# Patient Record
Sex: Male | Born: 1950 | Race: White | Hispanic: No | Marital: Single | Smoking: Never smoker
Health system: Southern US, Community
[De-identification: ages and names within clinical notes are randomized; demographics above are authoritative.]

## PROBLEM LIST (undated history)

## (undated) DIAGNOSIS — E785 Hyperlipidemia, unspecified: Secondary | ICD-10-CM

## (undated) DIAGNOSIS — J45909 Unspecified asthma, uncomplicated: Secondary | ICD-10-CM

## (undated) DIAGNOSIS — I10 Essential (primary) hypertension: Secondary | ICD-10-CM

## (undated) HISTORY — DX: Hyperlipidemia, unspecified: E78.5

## (undated) HISTORY — DX: Unspecified asthma, uncomplicated: J45.909

## (undated) HISTORY — DX: Essential (primary) hypertension: I10

---

## 2010-05-01 ENCOUNTER — Encounter: Payer: Self-pay | Admitting: Nurse Practitioner

## 2010-05-01 DIAGNOSIS — J45909 Unspecified asthma, uncomplicated: Secondary | ICD-10-CM | POA: Insufficient documentation

## 2010-05-01 DIAGNOSIS — E785 Hyperlipidemia, unspecified: Secondary | ICD-10-CM

## 2010-05-01 DIAGNOSIS — I10 Essential (primary) hypertension: Secondary | ICD-10-CM | POA: Insufficient documentation

## 2010-05-01 DIAGNOSIS — E1169 Type 2 diabetes mellitus with other specified complication: Secondary | ICD-10-CM | POA: Insufficient documentation

## 2010-06-16 ENCOUNTER — Encounter: Payer: Self-pay | Admitting: Nurse Practitioner

## 2010-06-23 ENCOUNTER — Encounter: Payer: Self-pay | Admitting: Nurse Practitioner

## 2012-05-01 ENCOUNTER — Other Ambulatory Visit: Payer: Self-pay | Admitting: Nurse Practitioner

## 2012-05-02 NOTE — Telephone Encounter (Signed)
Mail order, pls. print

## 2012-06-02 ENCOUNTER — Telehealth: Payer: Self-pay | Admitting: *Deleted

## 2012-06-02 NOTE — Telephone Encounter (Signed)
Give patient coupon

## 2012-06-02 NOTE — Telephone Encounter (Signed)
Pt. Is coming back by this afternoon, he does not have a phone. His copay on his Crestor went from $18 to $57, can we change to something else, he has tried Lovastatin and lipitor, was unable to take. Please let your nurse know, because he will come by this afternoon and ask for her.

## 2012-06-03 NOTE — Telephone Encounter (Signed)
crestor samples given yesterday

## 2012-07-07 ENCOUNTER — Telehealth: Payer: Self-pay | Admitting: Nurse Practitioner

## 2012-07-07 NOTE — Telephone Encounter (Signed)
Samples up front 

## 2012-07-18 ENCOUNTER — Encounter: Payer: Self-pay | Admitting: Nurse Practitioner

## 2012-07-18 ENCOUNTER — Ambulatory Visit (INDEPENDENT_AMBULATORY_CARE_PROVIDER_SITE_OTHER): Payer: Managed Care, Other (non HMO) | Admitting: Nurse Practitioner

## 2012-07-18 VITALS — BP 140/78 | HR 83 | Temp 98.2°F | Ht 68.0 in | Wt 170.0 lb

## 2012-07-18 DIAGNOSIS — I10 Essential (primary) hypertension: Secondary | ICD-10-CM

## 2012-07-18 DIAGNOSIS — E785 Hyperlipidemia, unspecified: Secondary | ICD-10-CM

## 2012-07-18 LAB — COMPLETE METABOLIC PANEL WITH GFR
CO2: 26 mEq/L (ref 19–32)
Creat: 1 mg/dL (ref 0.50–1.35)
GFR, Est African American: >89 mL/min
GFR, Est Non African American: 81 mL/min
Glucose, Bld: 122 mg/dL — ABNORMAL HIGH (ref 70–99)
Sodium: 132 mEq/L — ABNORMAL LOW (ref 135–145)
Total Bilirubin: 0.7 mg/dL (ref 0.3–1.2)
Total Protein: 7.3 g/dL (ref 6.0–8.3)

## 2012-07-18 MED ORDER — LISINOPRIL-HYDROCHLOROTHIAZIDE 20-25 MG PO TABS: 1.0000 | ORAL_TABLET | Freq: Every day | ORAL | Status: DC

## 2012-07-18 NOTE — Progress Notes (Signed)
  Subjective:    Patient ID: Charles Beltran, male    DOB: 03-04-1950, 62 y.o.   MRN: [id number]  Hypertension This is a chronic problem. The current episode started more than 1 year ago. The problem is unchanged. The problem is uncontrolled. Pertinent negatives include no chest pain, headaches, palpitations, peripheral edema or shortness of breath. There are no associated agents to hypertension. Risk factors for coronary artery disease include dyslipidemia and male gender. Past treatments include ACE inhibitors and diuretics. The current treatment provides moderate improvement. There are no compliance problems.   Hyperlipidemia This is a chronic problem. The current episode started more than 1 year ago. The problem is controlled. Recent lipid tests were reviewed and are normal. There are no known factors aggravating his hyperlipidemia. Pertinent negatives include no chest pain or shortness of breath. Current antihyperlipidemic treatment includes statins and diet change. The current treatment provides significant improvement of lipids. There are no compliance problems.  Risk factors for coronary artery disease include hypertension and male sex.      Review of Systems  Respiratory: Negative for shortness of breath.   Cardiovascular: Negative for chest pain and palpitations.  Neurological: Negative for headaches.  All other systems reviewed and are negative.       Objective:   Physical Exam  Constitutional: He is oriented to person, place, and time. He appears well-developed and well-nourished.  HENT:  Head: Normocephalic.  Right Ear: External ear normal.  Left Ear: External ear normal.  Nose: Nose normal.  Mouth/Throat: Oropharynx is clear and moist.  Eyes: EOM are normal. Pupils are equal, round, and reactive to light.  Neck: Normal range of motion. Neck supple. No thyromegaly present.  Cardiovascular: Normal rate, regular rhythm and intact distal pulses.   Murmur (2/6 systolic)  heard. Pulmonary/Chest: Effort normal and breath sounds normal. He has no wheezes. He has no rales.  Abdominal: Soft. Bowel sounds are normal.  Genitourinary:  DRE done in February 2014  Musculoskeletal: Normal range of motion.  Neurological: He is alert and oriented to person, place, and time.  Skin: Skin is warm and dry.  Psychiatric: He has a normal mood and affect. His behavior is normal. Judgment and thought content normal.   BP 140/78  Pulse 83  Temp(Src) 98.2 F (36.8 C) (Oral)  Ht 5\' 8"  (1.727 m)  Wt 170 lb (77.111 kg)  BMI 25.85 kg/m2        Assessment & Plan:  1. Hypertension Low NA+ diet  Increased lisinopril to 20/25 1 po qd #90 1 refill - COMPLETE METABOLIC PANEL WITH GFR  2. Hyperlipidemia Low fat diet an dexercise - NMR Lipoprofile with Lipids  Mary-Margaret Daphine Deutscher, FNP

## 2012-07-18 NOTE — Patient Instructions (Signed)
Health Maintenance, Males A healthy lifestyle and preventative care can promote health and wellness.  Maintain regular health, dental, and eye exams.  Eat a healthy diet. Foods like vegetables, fruits, whole grains, low-fat dairy products, and lean protein foods contain the nutrients you need without too many calories. Decrease your intake of foods high in solid fats, added sugars, and salt. Get information about a proper diet from your caregiver, if necessary.  Regular physical exercise is one of the most important things you can do for your health. Most adults should get at least 150 minutes of moderate-intensity exercise (any activity that increases your heart rate and causes you to sweat) each week. In addition, most adults need muscle-strengthening exercises on 2 or more days a week.   Maintain a healthy weight. The body mass index (BMI) is a screening tool to identify possible weight problems. It provides an estimate of body fat based on height and weight. Your caregiver can help determine your BMI, and can help you achieve or maintain a healthy weight. For adults 20 years and older:  A BMI below 18.5 is considered underweight.  A BMI of 18.5 to 24.9 is normal.  A BMI of 25 to 29.9 is considered overweight.  A BMI of 30 and above is considered obese.  Maintain normal blood lipids and cholesterol by exercising and minimizing your intake of saturated fat. Eat a balanced diet with plenty of fruits and vegetables. Blood tests for lipids and cholesterol should begin at age 20 and be repeated every 5 years. If your lipid or cholesterol levels are high, you are over 50, or you are a high risk for heart disease, you may need your cholesterol levels checked more frequently.Ongoing high lipid and cholesterol levels should be treated with medicines, if diet and exercise are not effective.  If you smoke, find out from your caregiver how to quit. If you do not use tobacco, do not start.  If you  choose to drink alcohol, do not exceed 2 drinks per day. One drink is considered to be 12 ounces (355 mL) of beer, 5 ounces (148 mL) of wine, or 1.5 ounces (44 mL) of liquor.  Avoid use of street drugs. Do not share needles with anyone. Ask for help if you need support or instructions about stopping the use of drugs.  High blood pressure causes heart disease and increases the risk of stroke. Blood pressure should be checked at least every 1 to 2 years. Ongoing high blood pressure should be treated with medicines if weight loss and exercise are not effective.  If you are 45 to 61 years old, ask your caregiver if you should take aspirin to prevent heart disease.  Diabetes screening involves taking a blood sample to check your fasting blood sugar level. This should be done once every 3 years, after age 45, if you are within normal weight and without risk factors for diabetes. Testing should be considered at a younger age or be carried out more frequently if you are overweight and have at least 1 risk factor for diabetes.  Colorectal cancer can be detected and often prevented. Most routine colorectal cancer screening begins at the age of 50 and continues through age 75. However, your caregiver may recommend screening at an earlier age if you have risk factors for colon cancer. On a yearly basis, your caregiver may provide home test kits to check for hidden blood in the stool. Use of a small camera at the end of a tube,   to directly examine the colon (sigmoidoscopy or colonoscopy), can detect the earliest forms of colorectal cancer. Talk to your caregiver about this at age 50, when routine screening begins. Direct examination of the colon should be repeated every 5 to 10 years through age 75, unless early forms of pre-cancerous polyps or small growths are found.  Hepatitis C blood testing is recommended for all people born from 1945 through 1965 and any individual with known risks for hepatitis C.  Healthy  men should no longer receive prostate-specific antigen (PSA) blood tests as part of routine cancer screening. Consult with your caregiver about prostate cancer screening.  Testicular cancer screening is not recommended for adolescents or adult males who have no symptoms. Screening includes self-exam, caregiver exam, and other screening tests. Consult with your caregiver about any symptoms you have or any concerns you have about testicular cancer.  Practice safe sex. Use condoms and avoid high-risk sexual practices to reduce the spread of sexually transmitted infections (STIs).  Use sunscreen with a sun protection factor (SPF) of 30 or greater. Apply sunscreen liberally and repeatedly throughout the day. You should seek shade when your shadow is shorter than you. Protect yourself by wearing long sleeves, pants, a wide-brimmed hat, and sunglasses year round, whenever you are outdoors.  Notify your caregiver of new moles or changes in moles, especially if there is a change in shape or color. Also notify your caregiver if a mole is larger than the size of a pencil eraser.  A one-time screening for abdominal aortic aneurysm (AAA) and surgical repair of large AAAs by sound wave imaging (ultrasonography) is recommended for ages 65 to 75 years who are current or former smokers.  Stay current with your immunizations. Document Released: 07/21/2007 Document Revised: 04/16/2011 Document Reviewed: 06/19/2010 [employer] Patient Information 2014 [employer].  

## 2012-07-21 LAB — NMR LIPOPROFILE WITH LIPIDS
HDL Size: 9.3 nm (ref >=9.2)
HDL-C: 52 mg/dL (ref >=40)
LDL Size: 20.4 nm — ABNORMAL LOW (ref >20.5)
Large HDL-P: 5.2 umol/L (ref >=4.8)

## 2012-07-24 ENCOUNTER — Encounter: Payer: Self-pay | Admitting: *Deleted

## 2012-07-29 ENCOUNTER — Telehealth: Payer: Self-pay | Admitting: Nurse Practitioner

## 2012-08-04 ENCOUNTER — Telehealth: Payer: Self-pay | Admitting: Nurse Practitioner

## 2012-08-04 NOTE — Telephone Encounter (Signed)
MMM sent this directly to [facility] 07/18/12

## 2012-08-04 NOTE — Telephone Encounter (Signed)
done

## 2012-08-06 ENCOUNTER — Telehealth: Payer: Self-pay | Admitting: Nurse Practitioner

## 2012-08-12 NOTE — Telephone Encounter (Signed)
Left message to call back. x3

## 2012-09-03 ENCOUNTER — Telehealth: Payer: Self-pay | Admitting: Nurse Practitioner

## 2012-09-04 NOTE — Telephone Encounter (Signed)
Samples Crestor 10mg  given 09/03/12

## 2012-10-31 ENCOUNTER — Ambulatory Visit (INDEPENDENT_AMBULATORY_CARE_PROVIDER_SITE_OTHER): Payer: Managed Care, Other (non HMO) | Admitting: Nurse Practitioner

## 2012-10-31 ENCOUNTER — Encounter: Payer: Self-pay | Admitting: Nurse Practitioner

## 2012-10-31 VITALS — BP 139/72 | HR 78 | Temp 97.9°F | Ht 68.0 in | Wt 179.0 lb

## 2012-10-31 DIAGNOSIS — Z23 Encounter for immunization: Secondary | ICD-10-CM

## 2012-10-31 DIAGNOSIS — Z125 Encounter for screening for malignant neoplasm of prostate: Secondary | ICD-10-CM

## 2012-10-31 DIAGNOSIS — I10 Essential (primary) hypertension: Secondary | ICD-10-CM

## 2012-10-31 DIAGNOSIS — E785 Hyperlipidemia, unspecified: Secondary | ICD-10-CM

## 2012-10-31 MED ORDER — LISINOPRIL-HYDROCHLOROTHIAZIDE 20-25 MG PO TABS: 1.0000 | ORAL_TABLET | Freq: Every day | ORAL | Status: DC

## 2012-10-31 NOTE — Patient Instructions (Addendum)
Health Maintenance, Males A healthy lifestyle and preventative care can promote health and wellness.  Maintain regular health, dental, and eye exams.  Eat a healthy diet. Foods like vegetables, fruits, whole grains, low-fat dairy products, and lean protein foods contain the nutrients you need without too many calories. Decrease your intake of foods high in solid fats, added sugars, and salt. Get information about a proper diet from your caregiver, if necessary.  Regular physical exercise is one of the most important things you can do for your health. Most adults should get at least 150 minutes of moderate-intensity exercise (any activity that increases your heart rate and causes you to sweat) each week. In addition, most adults need muscle-strengthening exercises on 2 or more days a week.   Maintain a healthy weight. The body mass index (BMI) is a screening tool to identify possible weight problems. It provides an estimate of body fat based on height and weight. Your caregiver can help determine your BMI, and can help you achieve or maintain a healthy weight. For adults 20 years and older:  A BMI below 18.5 is considered underweight.  A BMI of 18.5 to 24.9 is normal.  A BMI of 25 to 29.9 is considered overweight.  A BMI of 30 and above is considered obese.  Maintain normal blood lipids and cholesterol by exercising and minimizing your intake of saturated fat. Eat a balanced diet with plenty of fruits and vegetables. Blood tests for lipids and cholesterol should begin at age 20 and be repeated every 5 years. If your lipid or cholesterol levels are high, you are over 50, or you are a high risk for heart disease, you may need your cholesterol levels checked more frequently.Ongoing high lipid and cholesterol levels should be treated with medicines, if diet and exercise are not effective.  If you smoke, find out from your caregiver how to quit. If you do not use tobacco, do not start.  If you  choose to drink alcohol, do not exceed 2 drinks per day. One drink is considered to be 12 ounces (355 mL) of beer, 5 ounces (148 mL) of wine, or 1.5 ounces (44 mL) of liquor.  Avoid use of street drugs. Do not share needles with anyone. Ask for help if you need support or instructions about stopping the use of drugs.  High blood pressure causes heart disease and increases the risk of stroke. Blood pressure should be checked at least every 1 to 2 years. Ongoing high blood pressure should be treated with medicines if weight loss and exercise are not effective.  If you are 45 to 61 years old, ask your caregiver if you should take aspirin to prevent heart disease.  Diabetes screening involves taking a blood sample to check your fasting blood sugar level. This should be done once every 3 years, after age 45, if you are within normal weight and without risk factors for diabetes. Testing should be considered at a younger age or be carried out more frequently if you are overweight and have at least 1 risk factor for diabetes.  Colorectal cancer can be detected and often prevented. Most routine colorectal cancer screening begins at the age of 50 and continues through age 75. However, your caregiver may recommend screening at an earlier age if you have risk factors for colon cancer. On a yearly basis, your caregiver may provide home test kits to check for hidden blood in the stool. Use of a small camera at the end of a tube,   to directly examine the colon (sigmoidoscopy or colonoscopy), can detect the earliest forms of colorectal cancer. Talk to your caregiver about this at age 33, when routine screening begins. Direct examination of the colon should be repeated every 5 to 10 years through age 26, unless early forms of pre-cancerous polyps or small growths are found.  Hepatitis C blood testing is recommended for all people born from [birth year] through 1965 and any individual with known risks for hepatitis C.  Healthy  men should no longer receive prostate-specific antigen (PSA) blood tests as part of routine cancer screening. Consult with your caregiver about prostate cancer screening.  Testicular cancer screening is not recommended for adolescents or adult males who have no symptoms. Screening includes self-exam, caregiver exam, and other screening tests. Consult with your caregiver about any symptoms you have or any concerns you have about testicular cancer.  Practice safe sex. Use condoms and avoid high-risk sexual practices to reduce the spread of sexually transmitted infections (STIs).  Use sunscreen with a sun protection factor (SPF) of 30 or greater. Apply sunscreen liberally and repeatedly throughout the day. You should seek shade when your shadow is shorter than you. Protect yourself by wearing long sleeves, pants, a wide-brimmed hat, and sunglasses year round, whenever you are outdoors.  Notify your caregiver of new moles or changes in moles, especially if there is a change in shape or color. Also notify your caregiver if a mole is larger than the size of a pencil eraser.  A one-time screening for abdominal aortic aneurysm (AAA) and surgical repair of large AAAs by sound wave imaging (ultrasonography) is recommended for ages 59 to 35 years who are current or former smokers.  Stay current with your immunizations. Document Released: 07/21/2007 Document Revised: 04/16/2011 Document Reviewed: 06/19/2010 [facility] Patient Information 2014 [place], [place]. Influenza Virus Vaccine injection (Fluarix) What is this medicine? INFLUENZA VIRUS VACCINE (in floo EN zuh VAHY ruhs vak SEEN) helps to reduce the risk of getting influenza also known as the flu. This medicine may be used for other purposes; ask your health care provider or pharmacist if you have questions. What should I tell my health care provider before I take this medicine? They need to know if you have any of these conditions: -bleeding disorder  like hemophilia -fever or infection -Guillain-Barre syndrome or other neurological problems -immune system problems -infection with the human immunodeficiency virus (HIV) or AIDS -low blood platelet counts -multiple sclerosis -an unusual or allergic reaction to influenza virus vaccine, eggs, chicken proteins, latex, gentamicin, other medicines, foods, dyes or preservatives -pregnant or trying to get pregnant -breast-feeding How should I use this medicine? This vaccine is for injection into a muscle. It is given by a health care professional. A copy of Vaccine Information Statements will be given before each vaccination. Read this sheet carefully each time. The sheet may change frequently. Talk to your pediatrician regarding the use of this medicine in children. Special care may be needed. Overdosage: If you think you have taken too much of this medicine contact a poison control center or emergency room at once. NOTE: This medicine is only for you. Do not share this medicine with others. What if I miss a dose? This does not apply. What may interact with this medicine? -chemotherapy or radiation therapy -medicines that lower your immune system like etanercept, anakinra, infliximab, and adalimumab -medicines that treat or prevent blood clots like warfarin -phenytoin -steroid medicines like prednisone or cortisone -theophylline -vaccines This list may not describe all  possible interactions. Give your health care provider a list of all the medicines, herbs, non-prescription drugs, or dietary supplements you use. Also tell them if you smoke, drink alcohol, or use illegal drugs. Some items may interact with your medicine. What should I watch for while using this medicine? Report any side effects that do not go away within 3 days to your doctor or health care professional. Call your health care provider if any unusual symptoms occur within 6 weeks of receiving this vaccine. You may still catch  the flu, but the illness is not usually as bad. You cannot get the flu from the vaccine. The vaccine will not protect against colds or other illnesses that may cause fever. The vaccine is needed every year. What side effects may I notice from receiving this medicine? Side effects that you should report to your doctor or health care professional as soon as possible: -allergic reactions like skin rash, itching or hives, swelling of the face, lips, or tongue Side effects that usually do not require medical attention (report to your doctor or health care professional if they continue or are bothersome): -fever -headache -muscle aches and pains -pain, tenderness, redness, or swelling at site where injected -weak or tired This list may not describe all possible side effects. Call your doctor for medical advice about side effects. You may report side effects to FDA at 1-800-FDA-1088. Where should I keep my medicine? This vaccine is only given in a clinic, pharmacy, doctor's office, or other health care setting and will not be stored at home. NOTE: This sheet is a summary. It may not cover all possible information. If you have questions about this medicine, talk to your doctor, pharmacist, or health care provider.  2012, [employer]. (08/20/2007 9:30:40 AM)

## 2012-10-31 NOTE — Progress Notes (Signed)
  Subjective:    Patient ID: Charles Beltran, male    DOB: Jan 27, 1951, 62 y.o.   MRN: [id number]  Hypertension This is a chronic problem. The current episode started more than 1 year ago. The problem is unchanged. The problem is uncontrolled. Pertinent negatives include no chest pain, headaches, palpitations, peripheral edema or shortness of breath. There are no associated agents to hypertension. Risk factors for coronary artery disease include dyslipidemia and male gender. Past treatments include ACE inhibitors and diuretics. The current treatment provides moderate improvement. There are no compliance problems.   Hyperlipidemia This is a chronic problem. The current episode started more than 1 year ago. The problem is controlled. Recent lipid tests were reviewed and are normal. There are no known factors aggravating his hyperlipidemia. Pertinent negatives include no chest pain or shortness of breath. Current antihyperlipidemic treatment includes statins and diet change. The current treatment provides significant improvement of lipids. There are no compliance problems.  Risk factors for coronary artery disease include hypertension and male sex.      Review of Systems  Respiratory: Negative for shortness of breath.   Cardiovascular: Negative for chest pain and palpitations.  Neurological: Negative for headaches.  All other systems reviewed and are negative.       Objective:   Physical Exam  Constitutional: He is oriented to person, place, and time. He appears well-developed and well-nourished.  HENT:  Head: Normocephalic.  Right Ear: External ear normal.  Left Ear: External ear normal.  Nose: Nose normal.  Mouth/Throat: Oropharynx is clear and moist.  Eyes: EOM are normal. Pupils are equal, round, and reactive to light.  Neck: Normal range of motion. Neck supple. No thyromegaly present.  Cardiovascular: Normal rate, regular rhythm and intact distal pulses.   Murmur (2/6 systolic)  heard. Pulmonary/Chest: Effort normal and breath sounds normal. He has no wheezes. He has no rales.  Abdominal: Soft. Bowel sounds are normal.  Genitourinary:  DRE done in February 2014  Musculoskeletal: Normal range of motion.  Neurological: He is alert and oriented to person, place, and time.  Skin: Skin is warm and dry.  Psychiatric: He has a normal mood and affect. His behavior is normal. Judgment and thought content normal.   BP 139/72  Pulse 78  Temp(Src) 97.9 F (36.6 C) (Oral)  Ht 5\' 8"  (1.727 m)  Wt 179 lb (81.194 kg)  BMI 27.22 kg/m2        Assessment & Plan:  1. Hypertension Low NA+ diet  Increased lisinopril to 20/25 1 po qd #90 1 refill - COMPLETE METABOLIC PANEL WITH GFR  2. Hyperlipidemia Low fat diet an dexercise - NMR Lipoprofile with Lipids  Flu shot given today Health maintenance reviewed PSA level drawn today  Mary-Margaret Daphine Deutscher, FNP

## 2012-11-02 LAB — CMP14+EGFR
ALT: 34 IU/L (ref 0–44)
AST: 28 IU/L (ref 0–40)
Alkaline Phosphatase: 52 IU/L (ref 39–117)
BUN/Creatinine Ratio: 14 (ref 10–22)
CO2: 26 mmol/L (ref 18–29)
Calcium: 9.5 mg/dL (ref 8.6–10.2)
Chloride: 96 mmol/L — ABNORMAL LOW (ref 97–108)
Creatinine, Ser: 0.95 mg/dL (ref 0.76–1.27)
Globulin, Total: 2.5 g/dL (ref 1.5–4.5)
Glucose: 115 mg/dL — ABNORMAL HIGH (ref 65–99)
Potassium: 4.2 mmol/L (ref 3.5–5.2)
Sodium: 136 mmol/L (ref 134–144)

## 2012-11-02 LAB — NMR, LIPOPROFILE
Cholesterol: 148 mg/dL (ref <200)
HDL Cholesterol by NMR: 53 mg/dL (ref >=40)
HDL Particle Number: 40.1 umol/L (ref >=30.5)
LDLC SERPL CALC-MCNC: 76 mg/dL (ref <100)
LP-IR Score: 26 (ref <=45)

## 2012-11-02 LAB — PSA, TOTAL AND FREE
PSA, Free: 0.28 ng/mL
PSA: 0.9 ng/mL (ref 0.0–4.0)

## 2012-11-04 ENCOUNTER — Ambulatory Visit: Payer: Managed Care, Other (non HMO)

## 2012-11-28 ENCOUNTER — Telehealth: Payer: Self-pay | Admitting: Nurse Practitioner

## 2012-11-28 NOTE — Telephone Encounter (Signed)
Don't have any- call next week

## 2012-12-02 NOTE — Telephone Encounter (Signed)
Up front 

## 2012-12-30 ENCOUNTER — Telehealth: Payer: Self-pay | Admitting: Nurse Practitioner

## 2012-12-31 NOTE — Telephone Encounter (Signed)
Patient aware.

## 2013-01-16 ENCOUNTER — Telehealth: Payer: Self-pay | Admitting: Nurse Practitioner

## 2013-02-10 ENCOUNTER — Ambulatory Visit (INDEPENDENT_AMBULATORY_CARE_PROVIDER_SITE_OTHER): Payer: Managed Care, Other (non HMO) | Admitting: Nurse Practitioner

## 2013-02-10 ENCOUNTER — Encounter: Payer: Self-pay | Admitting: Nurse Practitioner

## 2013-02-10 VITALS — BP 144/78 | HR 67 | Temp 98.0°F | Ht 68.0 in | Wt 174.0 lb

## 2013-02-10 DIAGNOSIS — E785 Hyperlipidemia, unspecified: Secondary | ICD-10-CM

## 2013-02-10 DIAGNOSIS — Z23 Encounter for immunization: Secondary | ICD-10-CM

## 2013-02-10 DIAGNOSIS — I10 Essential (primary) hypertension: Secondary | ICD-10-CM

## 2013-02-10 MED ORDER — LISINOPRIL-HYDROCHLOROTHIAZIDE 20-25 MG PO TABS: 1.0000 | ORAL_TABLET | Freq: Every day | ORAL | Status: DC

## 2013-02-10 MED ORDER — ROSUVASTATIN CALCIUM 10 MG PO TABS: 10.0000 mg | ORAL_TABLET | Freq: Every day | ORAL | Status: DC

## 2013-02-10 NOTE — Patient Instructions (Signed)

## 2013-02-10 NOTE — Progress Notes (Signed)
  Subjective:    Patient ID: Charles Beltran, male    DOB: Oct 15, 1950, 63 y.o.   MRN: [id number]  Patient in today for follow up of chronic medical problems- no changes since last visit. NO complaints today.   Hypertension This is a chronic problem. The current episode started more than 1 year ago. The problem is unchanged. The problem is uncontrolled. Pertinent negatives include no chest pain, headaches, palpitations, peripheral edema or shortness of breath. There are no associated agents to hypertension. Risk factors for coronary artery disease include dyslipidemia and male gender. Past treatments include ACE inhibitors and diuretics. The current treatment provides moderate improvement. There are no compliance problems.   Hyperlipidemia This is a chronic problem. The current episode started more than 1 year ago. The problem is controlled. Recent lipid tests were reviewed and are normal. There are no known factors aggravating his hyperlipidemia. Pertinent negatives include no chest pain or shortness of breath. Current antihyperlipidemic treatment includes statins and diet change. The current treatment provides significant improvement of lipids. There are no compliance problems.  Risk factors for coronary artery disease include hypertension and male sex.      Review of Systems  Respiratory: Negative for shortness of breath.   Cardiovascular: Negative for chest pain and palpitations.  Neurological: Negative for headaches.  All other systems reviewed and are negative.       Objective:   Physical Exam  Constitutional: He is oriented to person, place, and time. He appears well-developed and well-nourished.  HENT:  Head: Normocephalic.  Right Ear: External ear normal.  Left Ear: External ear normal.  Nose: Nose normal.  Mouth/Throat: Oropharynx is clear and moist.  Eyes: EOM are normal. Pupils are equal, round, and reactive to light.  Neck: Normal range of motion. Neck supple. No thyromegaly  present.  Cardiovascular: Normal rate, regular rhythm and intact distal pulses.   Murmur (2/6 systolic) heard. Pulmonary/Chest: Effort normal and breath sounds normal. He has no wheezes. He has no rales.  Abdominal: Soft. Bowel sounds are normal.  Genitourinary:  DRE done in February 2014  Musculoskeletal: Normal range of motion.  Neurological: He is alert and oriented to person, place, and time.  Skin: Skin is warm and dry.  Psychiatric: He has a normal mood and affect. His behavior is normal. Judgment and thought content normal.   BP 144/78  Pulse 67  Temp(Src) 98 F (36.7 C) (Oral)  Ht $R'5\' 8"'zS$  (1.727 m)  Wt 174 lb (78.926 kg)  BMI 26.46 kg/m2        Assessment & Plan:   1. Hyperlipidemia   2. Hypertension    Orders Placed This Encounter  Procedures  . CMP14+EGFR  . NMR, lipoprofile   Meds ordered this encounter  Medications  . lisinopril-hydrochlorothiazide (PRINZIDE,ZESTORETIC) 20-25 MG per tablet    Sig: Take 1 tablet by mouth daily.    Dispense:  90 tablet    Refill:  1    Order Specific Question:  Supervising Provider    Answer:  Chipper Herb [[id number]]  . rosuvastatin (CRESTOR) 10 MG tablet    Sig: Take 1 tablet (10 mg total) by mouth daily.    Dispense:  30 tablet    Refill:  0    Order Specific Question:  Supervising Provider    Answer:  Joycelyn Man    Continue all meds Labs pending Diet and exercise encouraged Health maintenance reviewed Follow up in 3 months  [place], FNP

## 2013-02-11 LAB — NMR, LIPOPROFILE
Cholesterol: 151 mg/dL (ref <200)
HDL CHOLESTEROL BY NMR: 55 mg/dL (ref >=40)
HDL PARTICLE NUMBER: 33.8 umol/L (ref >=30.5)
LDL Particle Number: 1064 nmol/L — ABNORMAL HIGH (ref <1000)
LDL Size: 20 nm — ABNORMAL LOW (ref >20.5)
LDLC SERPL CALC-MCNC: 81 mg/dL (ref <100)
LP-IR Score: 37 (ref <=45)
SMALL LDL PARTICLE NUMBER: 955 nmol/L — AB (ref <=527)
TRIGLYCERIDES BY NMR: 74 mg/dL (ref <150)

## 2013-02-11 LAB — CMP14+EGFR
A/G RATIO: 2 (ref 1.1–2.5)
ALT: 32 IU/L (ref 0–44)
AST: 24 IU/L (ref 0–40)
Albumin: 4.9 g/dL — ABNORMAL HIGH (ref 3.6–4.8)
Alkaline Phosphatase: 47 IU/L (ref 39–117)
BUN/Creatinine Ratio: 20 (ref 10–22)
BUN: 20 mg/dL (ref 8–27)
CALCIUM: 9.8 mg/dL (ref 8.6–10.2)
CO2: 23 mmol/L (ref 18–29)
CREATININE: 0.99 mg/dL (ref 0.76–1.27)
Chloride: 95 mmol/L — ABNORMAL LOW (ref 97–108)
GFR calc Af Amer: 94 mL/min/{1.73_m2} (ref >59)
GFR, EST NON AFRICAN AMERICAN: 81 mL/min/{1.73_m2} (ref >59)
Globulin, Total: 2.5 g/dL (ref 1.5–4.5)
Glucose: 122 mg/dL — ABNORMAL HIGH (ref 65–99)
Potassium: 4.7 mmol/L (ref 3.5–5.2)
SODIUM: 136 mmol/L (ref 134–144)
TOTAL PROTEIN: 7.4 g/dL (ref 6.0–8.5)
Total Bilirubin: 0.7 mg/dL (ref 0.0–1.2)

## 2013-02-18 ENCOUNTER — Telehealth: Payer: Self-pay | Admitting: *Deleted

## 2013-02-18 NOTE — Telephone Encounter (Signed)
PT NOTIFIED ABOUT LABS.

## 2013-02-20 ENCOUNTER — Ambulatory Visit (HOSPITAL_COMMUNITY)
Admission: RE | Admit: 2013-02-20 | Discharge: 2013-02-20 | Disposition: A | Discharge status: Home / Self Care | Payer: Managed Care, Other (non HMO) | Source: Ambulatory Visit | Attending: General Practice | Admitting: General Practice

## 2013-02-20 ENCOUNTER — Telehealth: Payer: Self-pay | Admitting: General Practice

## 2013-02-20 ENCOUNTER — Ambulatory Visit (INDEPENDENT_AMBULATORY_CARE_PROVIDER_SITE_OTHER): Payer: Managed Care, Other (non HMO) | Admitting: General Practice

## 2013-02-20 VITALS — BP 144/78 | HR 97 | Temp 97.9°F | Ht 68.0 in | Wt 176.0 lb

## 2013-02-20 DIAGNOSIS — R6884 Jaw pain: Secondary | ICD-10-CM

## 2013-02-20 DIAGNOSIS — R221 Localized swelling, mass and lump, neck: Principal | ICD-10-CM

## 2013-02-20 DIAGNOSIS — R220 Localized swelling, mass and lump, head: Secondary | ICD-10-CM | POA: Insufficient documentation

## 2013-02-20 DIAGNOSIS — M26609 Unspecified temporomandibular joint disorder, unspecified side: Secondary | ICD-10-CM

## 2013-02-20 NOTE — Progress Notes (Signed)
Subjective:    Patient ID: Gayleen OremJames Ausley, male    DOB: 11/11/1950, 63 y.o.   MRN: [id number]  HPI Patient presents today with c/o left facial/jaw area swelling and pain. He reports onset of pain was 3-4 days ago and was seen by dentist yesterday. He was prescribed hydrocodone and azithromycin. Patient reports onset of swelling as yesterday after visiting dentist.  Pain worse with chewing on left side of mouth.                                                                                                                                                                                                                                                                                                                                                          Review of Systems  Constitutional: Negative for fever and chills.  HENT:       Left jaw tenderness and swelling  Respiratory: Negative for chest tightness and shortness of breath.   All other systems reviewed and are negative.       Objective:   Physical Exam  Constitutional: He is oriented to person, place, and time. He appears well-developed and well-nourished.  HENT:  Right Ear: External ear normal.  Left Ear: External ear normal.  Left mandible edema, negative for erythema or warmth  Cardiovascular: Normal rate, regular rhythm and normal heart sounds.   Pulmonary/Chest: Effort normal and breath sounds normal. No respiratory distress. He exhibits no tenderness.  Neurological: He is alert and oriented to person, place, and time.          Assessment & Plan:  1. Jaw swelling and 2. Pain in lower jaw - US Soft Tissue Head/Neck; Future -Continue taking medications as prescribed -Follow up in 2 days and sooner if symptoms worsen -may seek emergency medical treatment -Patient verbalized understanding  Dinesh Ulysse E. Tyliyah Mcmeekin, FNP-C

## 2013-02-20 NOTE — Telephone Encounter (Signed)
Provider spoke with him about results earlier today.

## 2013-03-20 ENCOUNTER — Telehealth: Payer: Self-pay | Admitting: Nurse Practitioner

## 2013-03-20 NOTE — Telephone Encounter (Signed)
No samples 

## 2013-04-27 ENCOUNTER — Telehealth: Payer: Self-pay | Admitting: Nurse Practitioner

## 2013-04-27 NOTE — Telephone Encounter (Signed)
Samples up front 

## 2013-05-12 ENCOUNTER — Encounter: Payer: Self-pay | Admitting: Nurse Practitioner

## 2013-05-12 ENCOUNTER — Ambulatory Visit (INDEPENDENT_AMBULATORY_CARE_PROVIDER_SITE_OTHER): Payer: Managed Care, Other (non HMO)

## 2013-05-12 ENCOUNTER — Ambulatory Visit (INDEPENDENT_AMBULATORY_CARE_PROVIDER_SITE_OTHER): Payer: Managed Care, Other (non HMO) | Admitting: Nurse Practitioner

## 2013-05-12 VITALS — BP 142/73 | HR 65 | Temp 96.8°F | Ht 68.0 in | Wt 173.0 lb

## 2013-05-12 DIAGNOSIS — Z0000 Encounter for general adult medical examination without abnormal findings: Secondary | ICD-10-CM

## 2013-05-12 DIAGNOSIS — E785 Hyperlipidemia, unspecified: Secondary | ICD-10-CM

## 2013-05-12 DIAGNOSIS — I10 Essential (primary) hypertension: Secondary | ICD-10-CM

## 2013-05-12 MED ORDER — LISINOPRIL-HYDROCHLOROTHIAZIDE 20-25 MG PO TABS: 1.0000 | ORAL_TABLET | Freq: Every day | ORAL | Status: DC

## 2013-05-12 NOTE — Progress Notes (Signed)
Subjective:    Patient ID: Arther Heisler, male    DOB: 10/18/1950, 63 y.o.   MRN: [id number]  HPI  Patient presents today for annual exam. Was recently seen in January for chronic medical problems and had lab work done. States he is doing well and has no new complaints. Recent dental exam in February. Eye exam to be scheduled. Patient states he is attempting to decrease salt intake as well as fried foods. Does not exercise.  Review of Systems  Respiratory: Negative for shortness of breath.   Cardiovascular: Negative for chest pain and palpitations.  Gastrointestinal: Negative for diarrhea and constipation.  Neurological: Negative for dizziness and headaches.  Psychiatric/Behavioral: Negative for sleep disturbance.  All other systems reviewed and are negative.       Objective:   Physical Exam  Constitutional: He is oriented to person, place, and time. He appears well-developed and well-nourished.  HENT:  Head: Normocephalic.  Right Ear: External ear normal.  Left Ear: External ear normal.  Nose: Nose normal.  Mouth/Throat: Oropharynx is clear and moist.  Cerumen impaction bil  Eyes: Pupils are equal, round, and reactive to light.  Neck: Normal range of motion. Neck supple. No JVD present. No thyromegaly present.  Cardiovascular: Normal rate, regular rhythm and intact distal pulses.  Exam reveals no gallop and no friction rub.   Murmur heard. 2/6 systolic   Pulmonary/Chest: Effort normal and breath sounds normal. No respiratory distress. He has no wheezes. He has no rales. He exhibits no tenderness.  Abdominal: Soft. Bowel sounds are normal. He exhibits no mass. There is no tenderness.  Genitourinary: Rectum normal, prostate normal and penis normal.  Musculoskeletal: Normal range of motion. He exhibits no edema.  Lymphadenopathy:    He has no cervical adenopathy.  Neurological: He is alert and oriented to person, place, and time. No cranial nerve deficit.  Skin: Skin is warm  and dry.  Psychiatric: He has a normal mood and affect. His behavior is normal. Judgment and thought content normal.   BP 142/73  Pulse 65  Temp(Src) 96.8 F (36 C) (Oral)  Ht '5\' 8"'  (1.727 m)  Wt 173 lb (78.472 kg)  BMI 26.31 kg/m2  EKG: NSR Preliminary reading by Ronnald Collum, FNP  [facility]  Chest X-Ray: WNL Preliminary reading by Ronnald Collum, FNP  [facility]       Assessment & Plan:   1. Annual physical exam   2. Hypertension   3. Hyperlipidemia    Orders Placed This Encounter  Procedures  . DG Chest 2 View    Standing Status: Future     Number of Occurrences: 1     Standing Expiration Date: 07/12/2014    Order Specific Question:  Reason for Exam (SYMPTOM  OR DIAGNOSIS REQUIRED)    Answer:  Annual Exam    Order Specific Question:  Preferred imaging location?    Answer:  Internal  . CMP14+EGFR  . NMR, lipoprofile  . EKG 12-Lead   Meds ordered this encounter  Medications  . lisinopril-hydrochlorothiazide (PRINZIDE,ZESTORETIC) 20-25 MG per tablet    Sig: Take 1 tablet by mouth daily.    Dispense:  90 tablet    Refill:  1    Order Specific Question:  Supervising Provider    Answer:  Chipper Herb [[id number]]  hemoccult cards given to patient with directionsl abs pending Health maintenance reviewed - hemocult cards given Diet and exercise encouraged Continue all meds Follow up  In 3 months for chronic medical problems &  1 yr for annual exam   Mary-Margaret Hassell Done, FNP

## 2013-05-12 NOTE — Patient Instructions (Signed)

## 2013-05-14 LAB — CMP14+EGFR
A/G RATIO: 2 (ref 1.1–2.5)
ALK PHOS: 50 IU/L (ref 39–117)
ALT: 31 IU/L (ref 0–44)
AST: 25 IU/L (ref 0–40)
Albumin: 4.7 g/dL (ref 3.6–4.8)
BILIRUBIN TOTAL: 0.7 mg/dL (ref 0.0–1.2)
BUN / CREAT RATIO: 16 (ref 10–22)
BUN: 17 mg/dL (ref 8–27)
CO2: 26 mmol/L (ref 18–29)
Calcium: 9.7 mg/dL (ref 8.6–10.2)
Chloride: 95 mmol/L — ABNORMAL LOW (ref 97–108)
Creatinine, Ser: 1.07 mg/dL (ref 0.76–1.27)
GFR, EST AFRICAN AMERICAN: 86 mL/min/{1.73_m2} (ref >59)
GFR, EST NON AFRICAN AMERICAN: 74 mL/min/{1.73_m2} (ref >59)
Globulin, Total: 2.3 g/dL (ref 1.5–4.5)
Glucose: 125 mg/dL — ABNORMAL HIGH (ref 65–99)
POTASSIUM: 4.5 mmol/L (ref 3.5–5.2)
SODIUM: 137 mmol/L (ref 134–144)
TOTAL PROTEIN: 7 g/dL (ref 6.0–8.5)

## 2013-05-14 LAB — NMR, LIPOPROFILE
Cholesterol: 151 mg/dL (ref <200)
HDL Cholesterol by NMR: 54 mg/dL (ref >=40)
HDL Particle Number: 33.4 umol/L (ref >=30.5)
LDL Particle Number: 891 nmol/L (ref <1000)
LDL SIZE: 20.6 nm (ref >20.5)
LDLC SERPL CALC-MCNC: 73 mg/dL (ref <100)
LP-IR SCORE: 27 (ref <=45)
Small LDL Particle Number: 337 nmol/L (ref <=527)
Triglycerides by NMR: 121 mg/dL (ref <150)

## 2013-07-07 ENCOUNTER — Ambulatory Visit: Payer: Managed Care, Other (non HMO) | Admitting: *Deleted

## 2013-07-07 ENCOUNTER — Telehealth: Payer: Self-pay | Admitting: Nurse Practitioner

## 2013-07-07 MED ORDER — ROSUVASTATIN CALCIUM 20 MG PO TABS: 10.0000 mg | ORAL_TABLET | Freq: Every day | ORAL | Status: DC

## 2013-07-07 NOTE — Telephone Encounter (Signed)
Patient came by this afternoon for samples

## 2013-07-07 NOTE — Progress Notes (Signed)
Patient aware and will pick up at front desk.

## 2013-08-14 ENCOUNTER — Ambulatory Visit (INDEPENDENT_AMBULATORY_CARE_PROVIDER_SITE_OTHER): Payer: Managed Care, Other (non HMO) | Admitting: Nurse Practitioner

## 2013-08-14 ENCOUNTER — Encounter: Payer: Self-pay | Admitting: Nurse Practitioner

## 2013-08-14 VITALS — BP 140/70 | HR 86 | Temp 97.7°F | Ht 68.0 in | Wt 176.0 lb

## 2013-08-14 DIAGNOSIS — E785 Hyperlipidemia, unspecified: Secondary | ICD-10-CM

## 2013-08-14 DIAGNOSIS — J45909 Unspecified asthma, uncomplicated: Secondary | ICD-10-CM

## 2013-08-14 DIAGNOSIS — I10 Essential (primary) hypertension: Secondary | ICD-10-CM

## 2013-08-14 MED ORDER — LISINOPRIL-HYDROCHLOROTHIAZIDE 20-25 MG PO TABS: 1.0000 | ORAL_TABLET | Freq: Every day | ORAL | Status: DC

## 2013-08-14 NOTE — Progress Notes (Signed)
  Subjective:    Patient ID: Charles Beltran, male    DOB: Apr 21, 1950, 63 y.o.   MRN: [id number]  Patient in today for follow up of chronic medical problems- no changes since last visit. NO complaints today.   Hypertension This is a chronic problem. The current episode started more than 1 year ago. The problem is unchanged. The problem is uncontrolled. Pertinent negatives include no chest pain, headaches, palpitations, peripheral edema or shortness of breath. There are no associated agents to hypertension. Risk factors for coronary artery disease include dyslipidemia and male gender. Past treatments include ACE inhibitors and diuretics. The current treatment provides moderate improvement. There are no compliance problems.   Hyperlipidemia This is a chronic problem. The current episode started more than 1 year ago. The problem is controlled. Recent lipid tests were reviewed and are normal. There are no known factors aggravating his hyperlipidemia. Pertinent negatives include no chest pain or shortness of breath. Current antihyperlipidemic treatment includes statins and diet change. The current treatment provides significant improvement of lipids. There are no compliance problems.  Risk factors for coronary artery disease include hypertension and male sex.      Review of Systems  Respiratory: Negative for shortness of breath.   Cardiovascular: Negative for chest pain and palpitations.  Neurological: Negative for headaches.  All other systems reviewed and are negative.      Objective:   Physical Exam  Constitutional: He is oriented to person, place, and time. He appears well-developed and well-nourished.  HENT:  Head: Normocephalic.  Right Ear: External ear normal.  Left Ear: External ear normal.  Nose: Nose normal.  Mouth/Throat: Oropharynx is clear and moist.  Eyes: EOM are normal. Pupils are equal, round, and reactive to light.  Neck: Normal range of motion. Neck supple. No thyromegaly  present.  Cardiovascular: Normal rate, regular rhythm and intact distal pulses.   Murmur (2/6 systolic) heard. Pulmonary/Chest: Effort normal and breath sounds normal. He has no wheezes. He has no rales.  Abdominal: Soft. Bowel sounds are normal.  Genitourinary:  DRE done in February 2014  Musculoskeletal: Normal range of motion.  Neurological: He is alert and oriented to person, place, and time.  Skin: Skin is warm and dry.  Psychiatric: He has a normal mood and affect. His behavior is normal. Judgment and thought content normal.   BP 140/70  Pulse 86  Temp(Src) 97.7 F (36.5 C) (Oral)  Ht $R'5\' 8"'RD$  (1.727 m)  Wt 176 lb (79.833 kg)  BMI 26.77 kg/m2        Assessment & Plan:   1. Hyperlipidemia   2. Essential hypertension   3. Asthma, currently inactive    Orders Placed This Encounter  Procedures  . CMP14+EGFR  . NMR, lipoprofile   Meds ordered this encounter  Medications  . lisinopril-hydrochlorothiazide (PRINZIDE,ZESTORETIC) 20-25 MG per tablet    Sig: Take 1 tablet by mouth daily.    Dispense:  90 tablet    Refill:  1    Order Specific Question:  Supervising Provider    Answer:  Chipper Herb [[id number]]   Patient was reminded to bring back hemoccult card.   Labs pending Health maintenance reviewed Diet and exercise encouraged Continue all meds Follow up  In 3 months PRN   Mary-Margaret Hassell Done, FNP

## 2013-08-14 NOTE — Patient Instructions (Signed)
Hypertension Hypertension, commonly called high blood pressure, is when the force of blood pumping through your arteries is too strong. Your arteries are the blood vessels that carry blood from your heart throughout your body. A blood pressure reading consists of a higher number over a lower number, such as 110/72. The higher number (systolic) is the pressure inside your arteries when your heart pumps. The lower number (diastolic) is the pressure inside your arteries when your heart relaxes. Ideally you want your blood pressure below 120/80. Hypertension forces your heart to work harder to pump blood. Your arteries may become narrow or stiff. Having hypertension puts you at risk for heart disease, stroke, and other problems.  RISK FACTORS Some risk factors for high blood pressure are controllable. Others are not.  Risk factors you cannot control include:   Race. You may be at higher risk if you are African American.  Age. Risk increases with age.  Gender. Men are at higher risk than women before age 45 years. After age 65, women are at higher risk than men. Risk factors you can control include:  Not getting enough exercise or physical activity.  Being overweight.  Getting too much fat, sugar, calories, or salt in your diet.  Drinking too much alcohol. SIGNS AND SYMPTOMS Hypertension does not usually cause signs or symptoms. Extremely high blood pressure (hypertensive crisis) may cause headache, anxiety, shortness of breath, and nosebleed. DIAGNOSIS  To check if you have hypertension, your health care provider will measure your blood pressure while you are seated, with your arm held at the level of your heart. It should be measured at least twice using the same arm. Certain conditions can cause a difference in blood pressure between your right and left arms. A blood pressure reading that is higher than normal on one occasion does not mean that you need treatment. If one blood pressure reading  is high, ask your health care provider about having it checked again. TREATMENT  Treating high blood pressure includes making lifestyle changes and possibly taking medication. Living a healthy lifestyle can help lower high blood pressure. You may need to change some of your habits. Lifestyle changes may include:  Following the DASH diet. This diet is high in fruits, vegetables, and whole grains. It is low in salt, red meat, and added sugars.  Getting at least 2 1/2 hours of brisk physical activity every week.  Losing weight if necessary.  Not smoking.  Limiting alcoholic beverages.  Learning ways to reduce stress. If lifestyle changes are not enough to get your blood pressure under control, your health care provider may prescribe medicine. You may need to take more than one. Work closely with your health care provider to understand the risks and benefits. HOME CARE INSTRUCTIONS  Have your blood pressure rechecked as directed by your health care provider.   Only take medicine as directed by your health care provider. Follow the directions carefully. Blood pressure medicines must be taken as prescribed. The medicine does not work as well when you skip doses. Skipping doses also puts you at risk for problems.   Do not smoke.   Monitor your blood pressure at home as directed by your health care provider. SEEK MEDICAL CARE IF:   You think you are having a reaction to medicines taken.  You have recurrent headaches or feel dizzy.  You have swelling in your ankles.  You have trouble with your vision. SEEK IMMEDIATE MEDICAL CARE IF:  You develop a severe headache or   confusion.  You have unusual weakness, numbness, or feel faint.  You have severe chest or abdominal pain.  You vomit repeatedly.  You have trouble breathing. MAKE SURE YOU:   Understand these instructions.  Will watch your condition.  Will get help right away if you are not doing well or get  worse. Document Released: 01/22/2005 Document Revised: 01/27/2013 Document Reviewed: 11/14/2012 [employer] Patient Information 2015 [employer]. This information is not intended to replace advice given to you by your health care provider. Make sure you discuss any questions you have with your health care provider.  

## 2013-08-15 LAB — CMP14+EGFR
ALBUMIN: 4.7 g/dL (ref 3.6–4.8)
ALK PHOS: 45 IU/L (ref 39–117)
ALT: 32 IU/L (ref 0–44)
AST: 25 IU/L (ref 0–40)
Albumin/Globulin Ratio: 2 (ref 1.1–2.5)
BUN/Creatinine Ratio: 15 (ref 10–22)
BUN: 15 mg/dL (ref 8–27)
CHLORIDE: 92 mmol/L — AB (ref 97–108)
CO2: 24 mmol/L (ref 18–29)
Calcium: 9.6 mg/dL (ref 8.6–10.2)
Creatinine, Ser: 1.03 mg/dL (ref 0.76–1.27)
GFR calc Af Amer: 90 mL/min/{1.73_m2} (ref >59)
GFR calc non Af Amer: 77 mL/min/{1.73_m2} (ref >59)
Globulin, Total: 2.4 g/dL (ref 1.5–4.5)
Glucose: 125 mg/dL — ABNORMAL HIGH (ref 65–99)
Potassium: 4.1 mmol/L (ref 3.5–5.2)
SODIUM: 137 mmol/L (ref 134–144)
Total Bilirubin: 0.7 mg/dL (ref 0.0–1.2)
Total Protein: 7.1 g/dL (ref 6.0–8.5)

## 2013-08-15 LAB — NMR, LIPOPROFILE
Cholesterol: 144 mg/dL (ref 100–199)
HDL Cholesterol by NMR: 65 mg/dL (ref >39)
HDL Particle Number: 36.3 umol/L (ref >=30.5)
LDL PARTICLE NUMBER: 703 nmol/L (ref <1000)
LDL Size: 20.7 nm (ref >20.5)
LDLC SERPL CALC-MCNC: 66 mg/dL (ref 0–99)
Small LDL Particle Number: 271 nmol/L (ref <=527)
Triglycerides by NMR: 67 mg/dL (ref 0–149)

## 2013-08-18 ENCOUNTER — Telehealth: Payer: Self-pay | Admitting: Nurse Practitioner

## 2013-08-18 NOTE — Telephone Encounter (Signed)
Left detailed message on voicemail.  

## 2013-09-21 ENCOUNTER — Telehealth: Payer: Self-pay | Admitting: Nurse Practitioner

## 2013-09-21 NOTE — Telephone Encounter (Signed)
Detailed message left that we are out of samples and that he can check back in the next few days

## 2013-09-24 ENCOUNTER — Telehealth: Payer: Self-pay | Admitting: Nurse Practitioner

## 2013-09-24 NOTE — Telephone Encounter (Signed)
Out of samples at the moment patient aware

## 2013-09-30 ENCOUNTER — Telehealth: Payer: Self-pay | Admitting: Nurse Practitioner

## 2013-09-30 NOTE — Telephone Encounter (Signed)
No samples available at this time. Pt aware.

## 2013-10-05 ENCOUNTER — Telehealth: Payer: Self-pay | Admitting: Family Medicine

## 2013-10-05 ENCOUNTER — Telehealth: Payer: Self-pay | Admitting: Nurse Practitioner

## 2013-10-05 MED ORDER — ROSUVASTATIN CALCIUM 10 MG PO TABS
10.0000 mg | ORAL_TABLET | Freq: Every day | ORAL | Status: DC
Start: 2013-10-05 — End: 2013-10-15

## 2013-10-05 NOTE — Telephone Encounter (Signed)
No samples left

## 2013-10-05 NOTE — Telephone Encounter (Signed)
crestor sent in 

## 2013-10-06 NOTE — Telephone Encounter (Signed)
Ok if we have any. 

## 2013-10-06 NOTE — Telephone Encounter (Signed)
No samples available at this time. PT will check back.

## 2013-10-08 ENCOUNTER — Telehealth: Payer: Self-pay | Admitting: Nurse Practitioner

## 2013-10-08 NOTE — Telephone Encounter (Signed)
Pt made aware of no samples of crestor

## 2013-10-15 ENCOUNTER — Telehealth: Payer: Self-pay | Admitting: Nurse Practitioner

## 2013-10-15 MED ORDER — ATORVASTATIN CALCIUM 40 MG PO TABS: 40.0000 mg | ORAL_TABLET | Freq: Every day | ORAL | Status: DC

## 2013-10-15 NOTE — Telephone Encounter (Signed)
crestor changes to lipitor

## 2013-10-19 ENCOUNTER — Telehealth: Payer: Self-pay | Admitting: Nurse Practitioner

## 2013-10-19 NOTE — Telephone Encounter (Signed)
Recheck labs in 1 month- none of the other statins will be strong enough

## 2013-10-19 NOTE — Telephone Encounter (Signed)
Left detailed message on voicemail.  

## 2013-10-19 NOTE — Telephone Encounter (Signed)
Med was changed from Crestor to Lipitor at patient's request due to cost. He has taken Lipitor before and had an increase in his liver functions.  He wants to know if he should have his LFTs checked after taking the Lipitor for a certain amount of time.

## 2013-11-03 ENCOUNTER — Ambulatory Visit (INDEPENDENT_AMBULATORY_CARE_PROVIDER_SITE_OTHER): Payer: Managed Care, Other (non HMO)

## 2013-11-03 DIAGNOSIS — Z23 Encounter for immunization: Secondary | ICD-10-CM

## 2013-11-25 ENCOUNTER — Ambulatory Visit (INDEPENDENT_AMBULATORY_CARE_PROVIDER_SITE_OTHER): Payer: Managed Care, Other (non HMO) | Admitting: Nurse Practitioner

## 2013-11-25 ENCOUNTER — Encounter: Payer: Self-pay | Admitting: Nurse Practitioner

## 2013-11-25 VITALS — BP 156/74 | HR 81 | Temp 98.0°F | Ht 68.0 in | Wt 177.4 lb

## 2013-11-25 DIAGNOSIS — I10 Essential (primary) hypertension: Secondary | ICD-10-CM

## 2013-11-25 DIAGNOSIS — E785 Hyperlipidemia, unspecified: Secondary | ICD-10-CM

## 2013-11-25 DIAGNOSIS — R739 Hyperglycemia, unspecified: Secondary | ICD-10-CM

## 2013-11-25 DIAGNOSIS — Z125 Encounter for screening for malignant neoplasm of prostate: Secondary | ICD-10-CM

## 2013-11-25 DIAGNOSIS — R7309 Other abnormal glucose: Secondary | ICD-10-CM

## 2013-11-25 MED ORDER — LISINOPRIL-HYDROCHLOROTHIAZIDE 20-25 MG PO TABS: 1.0000 | ORAL_TABLET | Freq: Every day | ORAL | Status: DC

## 2013-11-25 NOTE — Patient Instructions (Signed)

## 2013-11-25 NOTE — Progress Notes (Signed)
  Subjective:    Patient ID: Charles Beltran, male    DOB: Jun 09, 1950, 63 y.o.   MRN: [id number]  Patient in today for follow up of chronic medical problems- no acute complaints today.   Hypertension This is a chronic problem. The current episode started more than 1 year ago. The problem is unchanged. The problem is uncontrolled. Pertinent negatives include no chest pain, headaches, palpitations, peripheral edema or shortness of breath. There are no associated agents to hypertension. Risk factors for coronary artery disease include dyslipidemia and male gender. Past treatments include ACE inhibitors and diuretics. The current treatment provides moderate improvement. There are no compliance problems.   Hyperlipidemia This is a chronic problem. The current episode started more than 1 year ago. The problem is controlled. Recent lipid tests were reviewed and are normal. There are no known factors aggravating his hyperlipidemia. Pertinent negatives include no chest pain or shortness of breath. Current antihyperlipidemic treatment includes statins and diet change. The current treatment provides significant improvement of lipids. There are no compliance problems.  Risk factors for coronary artery disease include hypertension and male sex.      Review of Systems  Respiratory: Negative for shortness of breath.   Cardiovascular: Negative for chest pain and palpitations.  Neurological: Negative for headaches.  All other systems reviewed and are negative.      Objective:   Physical Exam  Constitutional: He is oriented to person, place, and time. He appears well-developed and well-nourished.  HENT:  Head: Normocephalic.  Right Ear: External ear normal.  Left Ear: External ear normal.  Nose: Nose normal.  Mouth/Throat: Oropharynx is clear and moist.  Eyes: EOM are normal. Pupils are equal, round, and reactive to light.  Neck: Normal range of motion. Neck supple. No thyromegaly present.  Cardiovascular:  Normal rate, regular rhythm and intact distal pulses.   Murmur (2/6 systolic) heard. Pulmonary/Chest: Effort normal and breath sounds normal. He has no wheezes. He has no rales.  Abdominal: Soft. Bowel sounds are normal.  Musculoskeletal: Normal range of motion.  Neurological: He is alert and oriented to person, place, and time.  Skin: Skin is warm and dry.  Psychiatric: He has a normal mood and affect. His behavior is normal. Judgment and thought content normal.   BP 156/74  Pulse 81  Temp(Src) 98 F (36.7 C) (Oral)  Ht $R'5\' 8"'Oo$  (1.727 m)  Wt 177 lb 6.4 oz (80.468 kg)  BMI 26.98 kg/m2      Assessment & Plan:   1. Hyperlipidemia Low fat diet - NMR, lipoprofile  2. Essential hypertension Low salt diet  - CMP14+EGFR - lisinopril-hydrochlorothiazide (PRINZIDE,ZESTORETIC) 20-25 MG per tablet; Take 1 tablet by mouth daily.  Dispense: 90 tablet; Refill: 1   hemoccult cards given to patient with directions Labs pending Health maintenance reviewed Diet and exercise encouraged Continue all meds Follow up  In 3 months PRN  Mary-Margaret Hassell Done, FNP

## 2013-11-26 ENCOUNTER — Telehealth: Payer: Self-pay | Admitting: *Deleted

## 2013-11-26 LAB — CMP14+EGFR
ALT: 35 IU/L (ref 0–44)
AST: 30 IU/L (ref 0–40)
Albumin/Globulin Ratio: 1.9 (ref 1.1–2.5)
Albumin: 4.7 g/dL (ref 3.6–4.8)
Alkaline Phosphatase: 52 IU/L (ref 39–117)
BUN/Creatinine Ratio: 14 (ref 10–22)
BUN: 14 mg/dL (ref 8–27)
CALCIUM: 9.6 mg/dL (ref 8.6–10.2)
CO2: 26 mmol/L (ref 18–29)
CREATININE: 0.97 mg/dL (ref 0.76–1.27)
Chloride: 96 mmol/L — ABNORMAL LOW (ref 97–108)
GFR calc Af Amer: 96 mL/min/{1.73_m2} (ref >59)
GFR, EST NON AFRICAN AMERICAN: 83 mL/min/{1.73_m2} (ref >59)
Globulin, Total: 2.5 g/dL (ref 1.5–4.5)
Glucose: 123 mg/dL — ABNORMAL HIGH (ref 65–99)
Potassium: 4.1 mmol/L (ref 3.5–5.2)
Sodium: 136 mmol/L (ref 134–144)
TOTAL PROTEIN: 7.2 g/dL (ref 6.0–8.5)
Total Bilirubin: 0.7 mg/dL (ref 0.0–1.2)

## 2013-11-26 LAB — NMR, LIPOPROFILE
Cholesterol: 121 mg/dL (ref 100–199)
HDL CHOLESTEROL BY NMR: 59 mg/dL (ref >39)
HDL PARTICLE NUMBER: 33.9 umol/L (ref >=30.5)
LDL Particle Number: 451 nmol/L (ref <1000)
LDL Size: 20.4 nm (ref >20.5)
LDLC SERPL CALC-MCNC: 50 mg/dL (ref 0–99)
LP-IR Score: <25 (ref <=45)
Small LDL Particle Number: 195 nmol/L (ref <=527)
TRIGLYCERIDES BY NMR: 62 mg/dL (ref 0–149)

## 2013-11-26 LAB — POCT GLYCOSYLATED HEMOGLOBIN (HGB A1C): Hemoglobin A1C: 5.4

## 2013-11-26 LAB — PSA, TOTAL AND FREE
PSA FREE: 0.3 ng/mL
PSA, Free Pct: 33.3 %
PSA: 0.9 ng/mL (ref 0.0–4.0)

## 2013-11-26 NOTE — Telephone Encounter (Signed)
Done

## 2013-11-26 NOTE — Addendum Note (Signed)
Addended by: Tommas OlpHANDY, ASHLEY N on: 11/26/2013 12:07 PM   Modules accepted: Orders

## 2013-11-30 ENCOUNTER — Telehealth: Payer: Self-pay | Admitting: *Deleted

## 2013-11-30 NOTE — Telephone Encounter (Signed)
Aware of lab results  

## 2014-03-08 ENCOUNTER — Encounter: Payer: Self-pay | Admitting: Nurse Practitioner

## 2014-03-08 ENCOUNTER — Ambulatory Visit (INDEPENDENT_AMBULATORY_CARE_PROVIDER_SITE_OTHER): Payer: Managed Care, Other (non HMO)

## 2014-03-08 ENCOUNTER — Ambulatory Visit (INDEPENDENT_AMBULATORY_CARE_PROVIDER_SITE_OTHER): Payer: Managed Care, Other (non HMO) | Admitting: Nurse Practitioner

## 2014-03-08 ENCOUNTER — Encounter (INDEPENDENT_AMBULATORY_CARE_PROVIDER_SITE_OTHER): Payer: Self-pay

## 2014-03-08 VITALS — BP 129/79 | HR 69 | Temp 97.4°F | Ht 68.0 in | Wt 178.4 lb

## 2014-03-08 DIAGNOSIS — J45909 Unspecified asthma, uncomplicated: Secondary | ICD-10-CM

## 2014-03-08 DIAGNOSIS — M7730 Calcaneal spur, unspecified foot: Secondary | ICD-10-CM | POA: Insufficient documentation

## 2014-03-08 DIAGNOSIS — J45998 Other asthma: Secondary | ICD-10-CM

## 2014-03-08 DIAGNOSIS — M79672 Pain in left foot: Secondary | ICD-10-CM

## 2014-03-08 DIAGNOSIS — I10 Essential (primary) hypertension: Secondary | ICD-10-CM

## 2014-03-08 DIAGNOSIS — E785 Hyperlipidemia, unspecified: Secondary | ICD-10-CM

## 2014-03-08 DIAGNOSIS — M7732 Calcaneal spur, left foot: Secondary | ICD-10-CM

## 2014-03-08 MED ORDER — MELOXICAM 15 MG PO TABS: 15.0000 mg | ORAL_TABLET | Freq: Every day | ORAL | Status: DC

## 2014-03-08 MED ORDER — LISINOPRIL-HYDROCHLOROTHIAZIDE 20-25 MG PO TABS: 1.0000 | ORAL_TABLET | Freq: Every day | ORAL | Status: DC

## 2014-03-08 MED ORDER — ATORVASTATIN CALCIUM 40 MG PO TABS: 40.0000 mg | ORAL_TABLET | Freq: Every day | ORAL | Status: DC

## 2014-03-08 NOTE — Progress Notes (Signed)
Subjective:    Patient ID: Charles Beltran, male    DOB: 11/23/1950, 64 y.o.   MRN: [id number]  Patient in today for follow up of chronic medical problems-  Patient complains of left heel pain that started about 3 weeks ago. Pt reports pain a 5/10. Pressure makes it worse. He has not tried anything.    Hypertension This is a chronic problem. The current episode started more than 1 year ago. The problem is controlled. Pertinent negatives include no chest pain, headaches, palpitations or shortness of breath. There are no associated agents to hypertension. Risk factors for coronary artery disease include dyslipidemia and male gender. Past treatments include ACE inhibitors and diuretics. There are no compliance problems.  There is no history of chronic renal disease.  Hyperlipidemia This is a chronic problem. The current episode started more than 1 year ago. The problem is controlled. He has no history of chronic renal disease or diabetes. There are no known factors aggravating his hyperlipidemia. Pertinent negatives include no chest pain or shortness of breath. Current antihyperlipidemic treatment includes statins. There are no compliance problems.  Risk factors for coronary artery disease include male sex and hypertension.      Review of Systems  Constitutional: Negative for fever and activity change.  HENT: Negative for congestion and dental problem.   Respiratory: Negative for shortness of breath.   Cardiovascular: Negative for chest pain and palpitations.  Neurological: Negative for headaches.  All other systems reviewed and are negative.      Objective:   Physical Exam  Constitutional: He is oriented to person, place, and time. He appears well-developed and well-nourished.  HENT:  Head: Normocephalic.  Right Ear: External ear normal.  Left Ear: External ear normal.  Nose: Nose normal.  Mouth/Throat: Oropharynx is clear and moist.  Eyes: EOM are normal. Pupils are equal, round, and  reactive to light.  Neck: Normal range of motion. Neck supple. No thyromegaly present.  Cardiovascular: Normal rate, regular rhythm and intact distal pulses.   Murmur (2/6 systolic) heard. Pulmonary/Chest: Effort normal and breath sounds normal. He has no wheezes. He has no rales.  Abdominal: Soft. Bowel sounds are normal.  Musculoskeletal: Normal range of motion.  Neurological: He is alert and oriented to person, place, and time.  Skin: Skin is warm and dry.  Psychiatric: He has a normal mood and affect. His behavior is normal. Judgment and thought content normal.   BP 129/79 mmHg  Pulse 69  Temp(Src) 97.4 F (36.3 C) (Oral)  Ht _0  (1.727 m)  Wt 178 lb 6.4 oz (80.922 kg)  BMI 27.13 kg/m2  Left foot x-ray/heel spur-Preliminary reading by Ronnald Collum, FNP  [facility]      Assessment & Plan:   1. Essential hypertension Low salt diet  - CMP14+EGFR  2. Hyperlipidemia Low fat diet - NMR, lipoprofile  3. Asthma, currently inactive   4. Heel spur, left  - DG Foot Complete Left; Future Heel cup or Dr. Zoe Lan insert in shoe   Meds ordered this encounter  Medications  . atorvastatin (LIPITOR) 40 MG tablet    Sig: Take 1 tablet (40 mg total) by mouth daily.    Dispense:  90 tablet    Refill:  1    Order Specific Question:  Supervising Provider    Answer:  Chipper Herb [[id number]]  . lisinopril-hydrochlorothiazide (PRINZIDE,ZESTORETIC) 20-25 MG per tablet    Sig: Take 1 tablet by mouth daily.    Dispense:  90 tablet  Refill:  1    Order Specific Question:  Supervising Provider    Answer:  Chipper Herb [[id number]]  . meloxicam (MOBIC) 15 MG tablet    Sig: Take 1 tablet (15 mg total) by mouth daily.    Dispense:  30 tablet    Refill:  3    Order Specific Question:  Supervising Provider    Answer:  Chipper Herb [[id number]]     hemoccult cards given to patient with directions Labs pending Health maintenance reviewed Diet and exercise encouraged Continue all meds Follow  up  In 3 month   [place], FNP

## 2014-03-08 NOTE — Patient Instructions (Addendum)
What are Advance Directives? A living will allows you to document your wishes concerning medical treatments at the end of life.   Before your living will can guide medical decision-making two physicians must certify: You are unable to make medical decisions,  You are in the medical condition specified in the state's living will law (such as "terminal illness" or "permanent unconsciousness"),  Other requirements also may apply, depending upon the state. A medical power of attorney (or healthcare proxy) allows you to appoint a person you trust as your healthcare agent (or surrogate decision maker), who is authorized to make medical decisions on your behalf.   Before a medical power of attorney goes into effect a persons physician must conclude that they are unable to make their own medical decisions. In addition: If a person regains the ability to make decisions, the agent cannot continue to act on the person's behalf.  Many states have additional requirements that apply only to decisions about life-sustaining medical treatments.  For example, before your agent can refuse a life-sustaining treatment on your behalf, a second physician may have to confirm your doctor's assessment that you are incapable of making treatment decisions. What Else Do I Need to Know?  Advance directives are legally valid throughout the [place]. While you do not need a lawyer to fill out an advance directive, your advance directive becomes legally valid as soon as you sign them in front of the required witnesses. The laws governing advance directives vary from state to state, so it is important to complete and sign advance directives that comply with your state's law. Also, advance directives can have different titles in different states.  Emergency medical technicians cannot honor living wills or medical powers of attorney. Once emergency personnel have been called, they must do what is necessary to stabilize a person  for transfer to a hospital, both from accident sites and from a home or other facility. After a physician fully evaluates the person's condition and determines the underlying conditions, advance directives can be implemented.  One states advance directive does not always work in another state. Some states do honor advance directives from another state; others will honor out-of-state advance directives as long as they are similar to the state's own law; and some states do not have an answer to this question. The best solution is if you spend a significant amount of time in more than one state, you should complete the advance directives for all the states you spend a significant amount of time in.  Advance directives do not expire. An advance directive remains in effect until you change it. If you complete a new advance directive, it invalidates the previous one.  You should review your advance directives periodically to ensure that they still reflect your wishes. If you want to change anything in an advance directive once you have completed it, you should complete a whole new document. [facility], [employer]   Heel Spur A heel spur is a hook of bone that can form on the calcaneus (the heel bone and the largest bone of the foot). Heel spurs are often associated with plantar fasciitis and usually come in people who have had the problem for an extended period of time. The cause of the relationship is unknown. The pain associated with them is thought to be caused by an inflammation (soreness and redness) of the plantar fascia rather than the spur itself. The plantar fascia is a thick  fibrous like tissue that runs from the calcaneus (heel bone) to the ball of the foot. This strong, tight tissue helps maintain the arch of your foot. It helps distribute the weight across your foot as you walk or run. Stresses placed on the plantar fascia can be tremendous. When  it is inflamed normal activities become painful. Pain is worse in the morning after sleeping. After sleeping the plantar fascia is tight. The first movements stretch the fascia and this causes pain. As the tendon loosens, the pain usually gets better. It often returns with too much standing or walking.  About 70% of patients with plantar fasciitis have a heel spur. About half of people without foot pain also have heel spurs. DIAGNOSIS  The diagnosis of a heel spur is made by X-ray. The X-ray shows a hook of bone protruding from the bottom of the calcaneus at the point where the plantar fascia is attached to the heel bone.  TREATMENT  It is necessary to find out what is causing the stretching of the plantar fascia. If the cause is over-pronation (flat feet), orthotics and proper foot ware may help.  Stretching exercises, losing weight, wearing shoes that have a cushioned heel that absorbs shock, and elevating the heel with the use of a heel cradle, heel cup, or orthotics may all help. Heel cradles and heel cups provide extra comfort and cushion to the heel, and reduce the amount of shock to the sore area. AVOIDING THE PAIN OF PLANTAR FASCIITIS AND HEEL SPURS  Consult a sports medicine professional before beginning a new exercise program.  Walking programs offer a good workout. There is a lower chance of overuse injuries common to the runners. There is less impact and less jarring of the joints.  Begin all new exercise programs slowly. If problems or pains develop, decrease the amount of time or distance until you are at a comfortable level.  Wear good shoes and replace them regularly.  Stretch your foot and the heel cords at the back of the ankle (Achilles tendons) both before and after exercise.  Run or exercise on even surfaces that are not hard. For example, asphalt is better than pavement.  Do not run barefoot on hard surfaces.  If using a treadmill, vary the incline.  Do not continue  to workout if you have foot or joint problems. Seek professional help if they do not improve. HOME CARE INSTRUCTIONS   Avoid activities that cause you pain until you recover.  Use ice or cold packs to the problem or painful areas after working out.  Only take over-the-counter or prescription medicines for pain, discomfort, or fever as directed by your caregiver.  Soft shoe inserts or athletic shoes with air or gel sole cushions may be helpful.  If problems continue or become more severe, consult a sports medicine caregiver. Cortisone is a potent anti-inflammatory medication that may be injected into the painful area. You can discuss this treatment with your caregiver. MAKE SURE YOU:   Understand these instructions.  Will watch your condition.  Will get help right away if you are not doing well or get worse. Document Released: 02/28/2005 Document Revised: 04/16/2011 Document Reviewed: 03/25/2013 [facility]ExitCare Patient Information 2015 [employer], [place]. This information is not intended to replace advice given to you by your health care provider. Make sure you discuss any questions you have with your health care provider.

## 2014-03-09 LAB — CMP14+EGFR
A/G RATIO: 1.9 (ref 1.1–2.5)
ALBUMIN: 4.8 g/dL (ref 3.6–4.8)
ALT: 34 IU/L (ref 0–44)
AST: 23 IU/L (ref 0–40)
Alkaline Phosphatase: 57 IU/L (ref 39–117)
BUN/Creatinine Ratio: 18 (ref 10–22)
BUN: 17 mg/dL (ref 8–27)
CHLORIDE: 98 mmol/L (ref 97–108)
CO2: 23 mmol/L (ref 18–29)
CREATININE: 0.93 mg/dL (ref 0.76–1.27)
Calcium: 9.5 mg/dL (ref 8.6–10.2)
GFR calc non Af Amer: 87 mL/min/{1.73_m2} (ref >59)
GFR, EST AFRICAN AMERICAN: 101 mL/min/{1.73_m2} (ref >59)
GLOBULIN, TOTAL: 2.5 g/dL (ref 1.5–4.5)
GLUCOSE: 118 mg/dL — AB (ref 65–99)
POTASSIUM: 4.2 mmol/L (ref 3.5–5.2)
Sodium: 138 mmol/L (ref 134–144)
TOTAL PROTEIN: 7.3 g/dL (ref 6.0–8.5)
Total Bilirubin: 0.9 mg/dL (ref 0.0–1.2)

## 2014-03-09 LAB — NMR, LIPOPROFILE
CHOLESTEROL: 122 mg/dL (ref 100–199)
HDL CHOLESTEROL BY NMR: 52 mg/dL (ref >39)
HDL Particle Number: 33.8 umol/L (ref >=30.5)
LDL PARTICLE NUMBER: 624 nmol/L (ref <1000)
LDL SIZE: 20.5 nm (ref >20.5)
LDL-C: 55 mg/dL (ref 0–99)
LP-IR Score: 27 (ref <=45)
Small LDL Particle Number: 293 nmol/L (ref <=527)
Triglycerides by NMR: 76 mg/dL (ref 0–149)

## 2014-03-10 ENCOUNTER — Telehealth: Payer: Self-pay | Admitting: Nurse Practitioner

## 2014-03-10 NOTE — Telephone Encounter (Signed)
Patient aware.

## 2014-03-10 NOTE — Telephone Encounter (Signed)
-----   Message from [facility] HospitalMary-Margaret Martin, FNP sent at 03/09/2014  9:06 PM EST ----- Kidney and liver function stable Blood sugar a little elevated- watch carbs in diet Continue current meds- low fat diet and exercise and recheck in 3 months

## 2014-03-15 ENCOUNTER — Encounter: Payer: Self-pay | Admitting: Nurse Practitioner

## 2014-06-18 ENCOUNTER — Ambulatory Visit (INDEPENDENT_AMBULATORY_CARE_PROVIDER_SITE_OTHER): Payer: Managed Care, Other (non HMO) | Admitting: Nurse Practitioner

## 2014-06-18 ENCOUNTER — Encounter: Payer: Self-pay | Admitting: Nurse Practitioner

## 2014-06-18 VITALS — BP 128/88 | HR 66 | Temp 97.1°F | Ht 68.0 in | Wt 174.0 lb

## 2014-06-18 DIAGNOSIS — E785 Hyperlipidemia, unspecified: Secondary | ICD-10-CM

## 2014-06-18 DIAGNOSIS — Z0000 Encounter for general adult medical examination without abnormal findings: Secondary | ICD-10-CM | POA: Diagnosis not present

## 2014-06-18 DIAGNOSIS — Z125 Encounter for screening for malignant neoplasm of prostate: Secondary | ICD-10-CM

## 2014-06-18 DIAGNOSIS — I10 Essential (primary) hypertension: Secondary | ICD-10-CM

## 2014-06-18 LAB — POCT CBC
Granulocyte percent: 70 %G (ref 37–80)
HCT, POC: 46.2 % (ref 43.5–53.7)
Hemoglobin: 14.7 g/dL (ref 14.1–18.1)
LYMPH, POC: 1.4 (ref 0.6–3.4)
MCH: 29.1 pg (ref 27–31.2)
MCHC: 31.9 g/dL (ref 31.8–35.4)
MCV: 91.3 fL (ref 80–97)
MPV: 9 fL (ref 0–99.8)
POC Granulocyte: 5 (ref 2–6.9)
POC LYMPH PERCENT: 19.3 %L (ref 10–50)
Platelet Count, POC: 156 10*3/uL (ref 142–424)
RBC: 5.06 M/uL (ref 4.69–6.13)
RDW, POC: 12.5 %
WBC: 7.1 10*3/uL (ref 4.6–10.2)

## 2014-06-18 MED ORDER — LISINOPRIL-HYDROCHLOROTHIAZIDE 20-25 MG PO TABS: 1.0000 | ORAL_TABLET | Freq: Every day | ORAL | Status: DC

## 2014-06-18 MED ORDER — ATORVASTATIN CALCIUM 40 MG PO TABS: 40.0000 mg | ORAL_TABLET | Freq: Every day | ORAL | Status: DC

## 2014-06-18 NOTE — Progress Notes (Signed)
  Subjective:    Patient ID: Charles Beltran, male    DOB: 11/20/1950, 64 y.o.   MRN: [id number]  Patient here today for annual physical exam. He has no complaints today.   Hypertension This is a chronic problem. The current episode started more than 1 year ago. The problem is controlled. Pertinent negatives include no chest pain, headaches, palpitations or shortness of breath. There are no associated agents to hypertension. Risk factors for coronary artery disease include dyslipidemia and male gender. Past treatments include ACE inhibitors and diuretics. There are no compliance problems.  There is no history of chronic renal disease.  Hyperlipidemia This is a chronic problem. The current episode started more than 1 year ago. The problem is controlled. He has no history of chronic renal disease or diabetes. There are no known factors aggravating his hyperlipidemia. Pertinent negatives include no chest pain or shortness of breath. Current antihyperlipidemic treatment includes statins. There are no compliance problems.  Risk factors for coronary artery disease include male sex and hypertension.      Review of Systems  Constitutional: Negative for fever and activity change.  HENT: Negative for congestion and dental problem.   Respiratory: Negative for shortness of breath.   Cardiovascular: Negative for chest pain and palpitations.  Neurological: Negative for headaches.  All other systems reviewed and are negative.      Objective:   Physical Exam  Constitutional: He is oriented to person, place, and time. He appears well-developed and well-nourished.  HENT:  Head: Normocephalic.  Right Ear: External ear normal.  Left Ear: External ear normal.  Nose: Nose normal.  Mouth/Throat: Oropharynx is clear and moist.  Eyes: EOM are normal. Pupils are equal, round, and reactive to light.  Neck: Normal range of motion. Neck supple. No thyromegaly present.  Cardiovascular: Normal rate, regular rhythm and  intact distal pulses.   Murmur (3/6 systolic) heard. Pulmonary/Chest: Effort normal and breath sounds normal. He has no wheezes. He has no rales.  Abdominal: Soft. Bowel sounds are normal.  Genitourinary: Rectum normal, prostate normal and penis normal. Guaiac negative stool. No penile tenderness.  Musculoskeletal: Normal range of motion.  Neurological: He is alert and oriented to person, place, and time.  Skin: Skin is warm and dry.  Psychiatric: He has a normal mood and affect. His behavior is normal. Judgment and thought content normal.   BP 128/88 mmHg  Pulse 66  Temp(Src) 97.1 F (36.2 C) (Oral)  Ht $R'5\' 8"'GG$  (1.727 m)  Wt 174 lb (78.926 kg)  BMI 26.46 kg/m2        Assessment & Plan:  1. Essential hypertension Do not add slat to diet - lisinopril-hydrochlorothiazide (PRINZIDE,ZESTORETIC) 20-25 MG per tablet; Take 1 tablet by mouth daily.  Dispense: 90 tablet; Refill: 1 - CMP14+EGFR  2. Hyperlipidemia Low fat diet - atorvastatin (LIPITOR) 40 MG tablet; Take 1 tablet (40 mg total) by mouth daily.  Dispense: 90 tablet; Refill: 1 - NMR, lipoprofile  3. Annual physical exam - POCT CBC  4. Prostate cancer screening - PSA, total and free    Labs pending Health maintenance reviewed Diet and exercise encouraged Continue all meds Follow up  In 6 Months    [place], FNP

## 2014-06-18 NOTE — Patient Instructions (Signed)
Exercise to Stay Healthy Exercise helps you become and stay healthy. EXERCISE IDEAS AND TIPS Choose exercises that:  You enjoy.  Fit into your day. You do not need to exercise really hard to be healthy. You can do exercises at a slow or medium level and stay healthy. You can:  Stretch before and after working out.  Try yoga, Pilates, or tai chi.  Lift weights.  Walk fast, swim, jog, run, climb stairs, bicycle, dance, or rollerskate.  Take aerobic classes. Exercises that burn about 150 calories:  Running 1  miles in 15 minutes.  Playing volleyball for 45 to 60 minutes.  Washing and waxing a car for 45 to 60 minutes.  Playing touch football for 45 minutes.  Walking 1  miles in 35 minutes.  Pushing a stroller 1  miles in 30 minutes.  Playing basketball for 30 minutes.  Raking leaves for 30 minutes.  Bicycling 5 miles in 30 minutes.  Walking 2 miles in 30 minutes.  Dancing for 30 minutes.  Shoveling snow for 15 minutes.  Swimming laps for 20 minutes.  Walking up stairs for 15 minutes.  Bicycling 4 miles in 15 minutes.  Gardening for 30 to 45 minutes.  Jumping rope for 15 minutes.  Washing windows or floors for 45 to 60 minutes. Document Released: 02/24/2010 Document Revised: 04/16/2011 Document Reviewed: 02/24/2010 [employer] Patient Information 2015 [employer]. This information is not intended to replace advice given to you by your health care provider. Make sure you discuss any questions you have with your health care provider.  

## 2014-06-19 LAB — NMR, LIPOPROFILE
Cholesterol: 120 mg/dL (ref 100–199)
HDL CHOLESTEROL BY NMR: 58 mg/dL (ref >39)
HDL Particle Number: 32.8 umol/L (ref >=30.5)
LDL PARTICLE NUMBER: 392 nmol/L (ref <1000)
LDL SIZE: 20.2 nm (ref >20.5)
LDL-C: 51 mg/dL (ref 0–99)
LP-IR Score: <25 (ref <=45)
SMALL LDL PARTICLE NUMBER: 236 nmol/L (ref <=527)
TRIGLYCERIDES BY NMR: 56 mg/dL (ref 0–149)

## 2014-06-19 LAB — PSA, TOTAL AND FREE
PROSTATE SPECIFIC AG, SERUM: 1 ng/mL (ref 0.0–4.0)
PSA, Free Pct: 34 %
PSA, Free: 0.34 ng/mL

## 2014-06-19 LAB — CMP14+EGFR
A/G RATIO: 1.8 (ref 1.1–2.5)
ALBUMIN: 4.6 g/dL (ref 3.6–4.8)
ALK PHOS: 52 IU/L (ref 39–117)
ALT: 35 IU/L (ref 0–44)
AST: 34 IU/L (ref 0–40)
BILIRUBIN TOTAL: 0.7 mg/dL (ref 0.0–1.2)
BUN/Creatinine Ratio: 17 (ref 10–22)
BUN: 17 mg/dL (ref 8–27)
CO2: 24 mmol/L (ref 18–29)
Calcium: 9.3 mg/dL (ref 8.6–10.2)
Chloride: 95 mmol/L — ABNORMAL LOW (ref 97–108)
Creatinine, Ser: 1.01 mg/dL (ref 0.76–1.27)
GFR calc Af Amer: 91 mL/min/{1.73_m2} (ref >59)
GFR, EST NON AFRICAN AMERICAN: 79 mL/min/{1.73_m2} (ref >59)
Globulin, Total: 2.5 g/dL (ref 1.5–4.5)
Glucose: 120 mg/dL — ABNORMAL HIGH (ref 65–99)
Potassium: 4.5 mmol/L (ref 3.5–5.2)
SODIUM: 137 mmol/L (ref 134–144)
Total Protein: 7.1 g/dL (ref 6.0–8.5)

## 2014-08-23 ENCOUNTER — Encounter: Payer: Self-pay | Admitting: *Deleted

## 2014-10-01 ENCOUNTER — Ambulatory Visit: Payer: Managed Care, Other (non HMO) | Admitting: Nurse Practitioner

## 2014-10-04 ENCOUNTER — Ambulatory Visit: Payer: Managed Care, Other (non HMO) | Admitting: Family

## 2014-10-05 ENCOUNTER — Ambulatory Visit (INDEPENDENT_AMBULATORY_CARE_PROVIDER_SITE_OTHER): Payer: Managed Care, Other (non HMO) | Admitting: Family

## 2014-10-05 ENCOUNTER — Encounter: Payer: Self-pay | Admitting: Family

## 2014-10-05 VITALS — BP 151/74 | HR 83 | Temp 98.5°F | Ht 68.0 in | Wt 176.8 lb

## 2014-10-05 DIAGNOSIS — J45998 Other asthma: Secondary | ICD-10-CM

## 2014-10-05 DIAGNOSIS — J45909 Unspecified asthma, uncomplicated: Secondary | ICD-10-CM

## 2014-10-05 DIAGNOSIS — E785 Hyperlipidemia, unspecified: Secondary | ICD-10-CM

## 2014-10-05 DIAGNOSIS — Z1159 Encounter for screening for other viral diseases: Secondary | ICD-10-CM

## 2014-10-05 DIAGNOSIS — E559 Vitamin D deficiency, unspecified: Secondary | ICD-10-CM | POA: Insufficient documentation

## 2014-10-05 DIAGNOSIS — I10 Essential (primary) hypertension: Secondary | ICD-10-CM

## 2014-10-05 MED ORDER — LISINOPRIL-HYDROCHLOROTHIAZIDE 20-12.5 MG PO TABS: 2.0000 | ORAL_TABLET | Freq: Every day | ORAL | Status: DC

## 2014-10-05 NOTE — Patient Instructions (Signed)

## 2014-10-05 NOTE — Progress Notes (Addendum)
Subjective:    Patient ID: Charles Beltran, male    DOB: May 05, 1950, 64 y.o.   MRN: [id number]  Pt presents to the office today for chronic follow up.  Hypertension This is a chronic problem. The current episode started more than 1 year ago. The problem has been waxing and waning since onset. The problem is uncontrolled. Pertinent negatives include no anxiety, headaches, palpitations, peripheral edema or shortness of breath. Risk factors for coronary artery disease include dyslipidemia, male gender and sedentary lifestyle. Past treatments include ACE inhibitors and diuretics. The current treatment provides moderate improvement. There is no history of kidney disease, CAD/MI, CVA, heart failure or a thyroid problem. There is no history of sleep apnea.  Hyperlipidemia This is a chronic problem. The current episode started more than 1 year ago. The problem is controlled. Recent lipid tests were reviewed and are normal. He has no history of diabetes. Pertinent negatives include no leg pain or shortness of breath. Current antihyperlipidemic treatment includes statins. The current treatment provides moderate improvement of lipids. Risk factors for coronary artery disease include dyslipidemia, hypertension and male sex.      Review of Systems  Constitutional: Negative.   HENT: Negative.   Respiratory: Negative.  Negative for shortness of breath.   Cardiovascular: Negative.  Negative for palpitations.  Gastrointestinal: Negative.   Endocrine: Negative.   Genitourinary: Negative.   Musculoskeletal: Negative.   Neurological: Negative.  Negative for headaches.  Hematological: Negative.   Psychiatric/Behavioral: Negative.   All other systems reviewed and are negative.      Objective:   Physical Exam  Constitutional: He is oriented to person, place, and time. He appears well-developed and well-nourished. No distress.  HENT:  Head: Normocephalic.  Right Ear: External ear normal.  Left Ear: External  ear normal.  Nose: Nose normal.  Mouth/Throat: Oropharynx is clear and moist.  Eyes: Pupils are equal, round, and reactive to light. Right eye exhibits no discharge. Left eye exhibits no discharge.  Neck: Normal range of motion. Neck supple. No thyromegaly present.  Cardiovascular: Normal rate, regular rhythm and intact distal pulses.   Murmur heard. Pulmonary/Chest: Effort normal and breath sounds normal. No respiratory distress. He has no wheezes.  Abdominal: Soft. Bowel sounds are normal. He exhibits no distension. There is no tenderness.  Genitourinary: Penis normal. No penile tenderness.  Musculoskeletal: Normal range of motion. He exhibits no edema or tenderness.  Neurological: He is alert and oriented to person, place, and time. He has normal reflexes. No cranial nerve deficit.  Skin: Skin is warm and dry. No rash noted. No erythema.  Psychiatric: He has a normal mood and affect. His behavior is normal. Judgment and thought content normal.  Vitals reviewed.   BP 151/74 mmHg  Pulse 83  Temp(Src) 98.5 F (36.9 C) (Oral)  Ht _0  (1.727 m)  Wt 176 lb 12.8 oz (80.196 kg)  BMI 26.89 kg/m2       Assessment & Plan:  1. Essential hypertension -PT's lisinopril increased to 40 mg from 20 mg -Daily blood pressure log given with instructions on how to fill out and told to bring to next visit -Dash diet information given -Exercise encouraged - Stress Management  -Continue current meds -RTO in 2 weeks - CMP14+EGFR - lisinopril-hydrochlorothiazide (ZESTORETIC) 20-12.5 MG per tablet; Take 2 tablets by mouth daily.  Dispense: 90 tablet; Refill: 3  2. Hyperlipidemia - CMP14+EGFR - Lipid panel  3. Vitamin D deficiency - CMP14+EGFR - Vit D  25 hydroxy (rtn osteoporosis  monitoring)  4. Asthma, currently inactive - CMP14+EGFR  5. Need for hepatitis C screening test - Hepatitis C antibody   Continue all meds Labs pending Health Maintenance reviewed Diet and exercise  encouraged RTO 2 weeks to recheck HTN  Evelina Dun, FNP

## 2014-10-06 LAB — CMP14+EGFR
A/G RATIO: 2 (ref 1.1–2.5)
ALBUMIN: 4.5 g/dL (ref 3.6–4.8)
ALT: 36 IU/L (ref 0–44)
AST: 28 IU/L (ref 0–40)
Alkaline Phosphatase: 56 IU/L (ref 39–117)
BILIRUBIN TOTAL: 0.6 mg/dL (ref 0.0–1.2)
BUN / CREAT RATIO: 17 (ref 10–22)
BUN: 15 mg/dL (ref 8–27)
CALCIUM: 9.4 mg/dL (ref 8.6–10.2)
CHLORIDE: 97 mmol/L (ref 97–108)
CO2: 22 mmol/L (ref 18–29)
Creatinine, Ser: 0.89 mg/dL (ref 0.76–1.27)
GFR calc Af Amer: 105 mL/min/{1.73_m2} (ref >59)
GFR, EST NON AFRICAN AMERICAN: 91 mL/min/{1.73_m2} (ref >59)
Globulin, Total: 2.3 g/dL (ref 1.5–4.5)
Glucose: 141 mg/dL — ABNORMAL HIGH (ref 65–99)
Potassium: 4.1 mmol/L (ref 3.5–5.2)
Sodium: 139 mmol/L (ref 134–144)
Total Protein: 6.8 g/dL (ref 6.0–8.5)

## 2014-10-06 LAB — VITAMIN D 25 HYDROXY (VIT D DEFICIENCY, FRACTURES): VIT D 25 HYDROXY: 40.6 ng/mL (ref 30.0–100.0)

## 2014-10-06 LAB — LIPID PANEL
CHOL/HDL RATIO: 2.1 ratio (ref 0.0–5.0)
Cholesterol, Total: 132 mg/dL (ref 100–199)
HDL: 63 mg/dL (ref >39)
LDL CALC: 57 mg/dL (ref 0–99)
Triglycerides: 60 mg/dL (ref 0–149)
VLDL CHOLESTEROL CAL: 12 mg/dL (ref 5–40)

## 2014-10-06 LAB — HEPATITIS C ANTIBODY

## 2014-10-11 IMAGING — US US SOFT TISSUE HEAD/NECK
1 series · 14 of 14 positions shown · non-contrast
Comparison: None.

CLINICAL DATA: Left-sided jaw swelling

EXAM:
ULTRASOUND OF HEAD/NECK SOFT TISSUES
TECHNIQUE: Ultrasound examination of the head and neck soft tissues was
performed in the area of clinical concern.

[Series 1: us soft tissue head/neck · 0.05mm/px · 14 acquisitions, 14 frames shown]
[im 1/14]
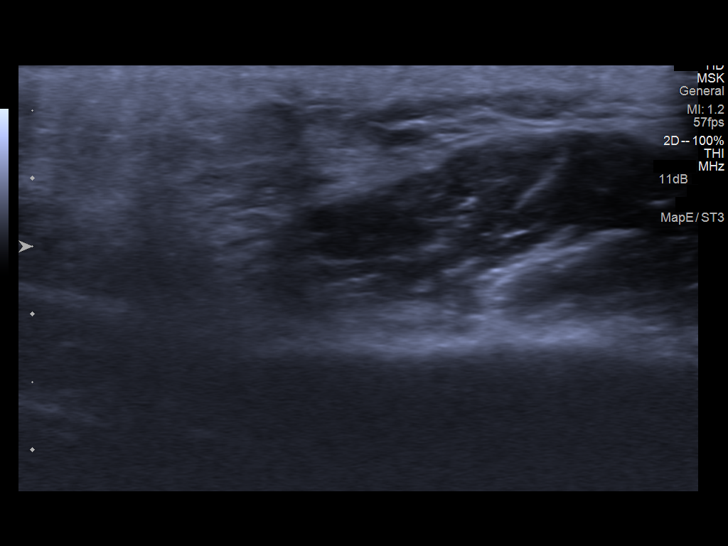
[im 2/14]
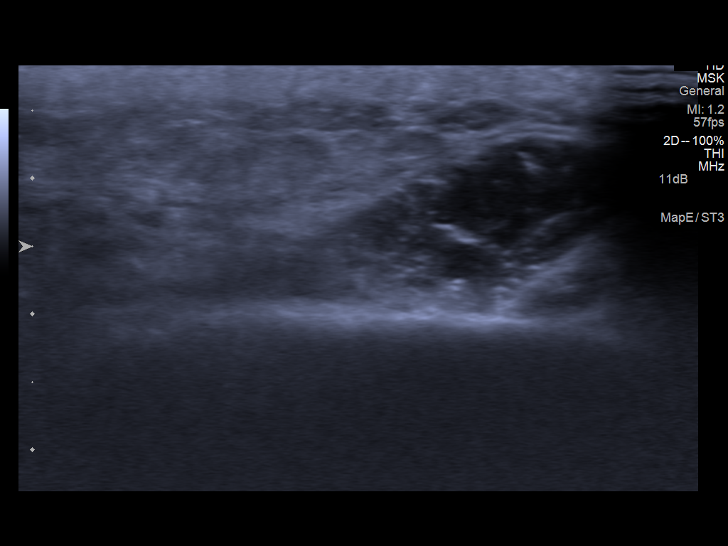
[im 3/14]
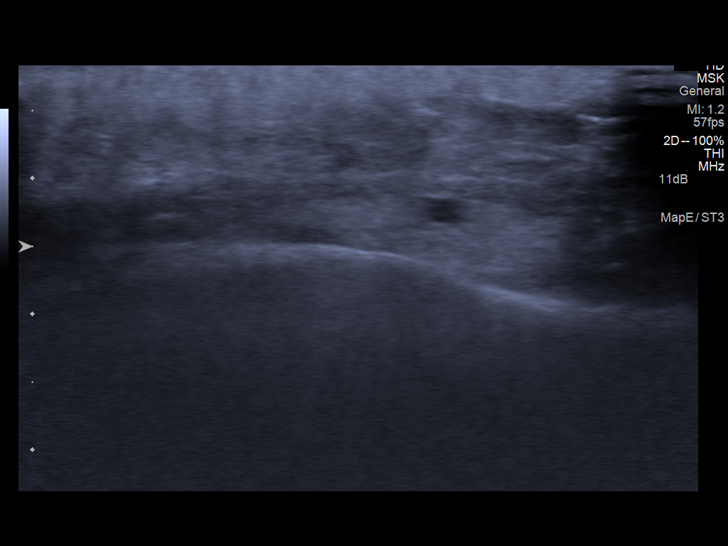
[im 4/14]
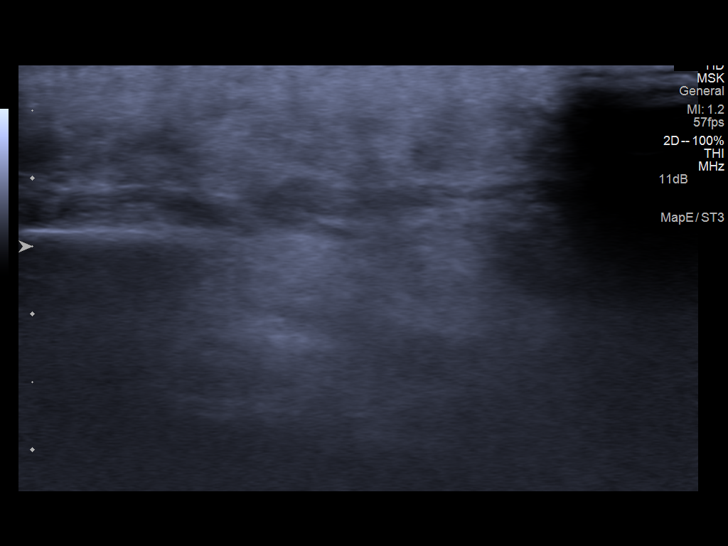
[im 5/14]
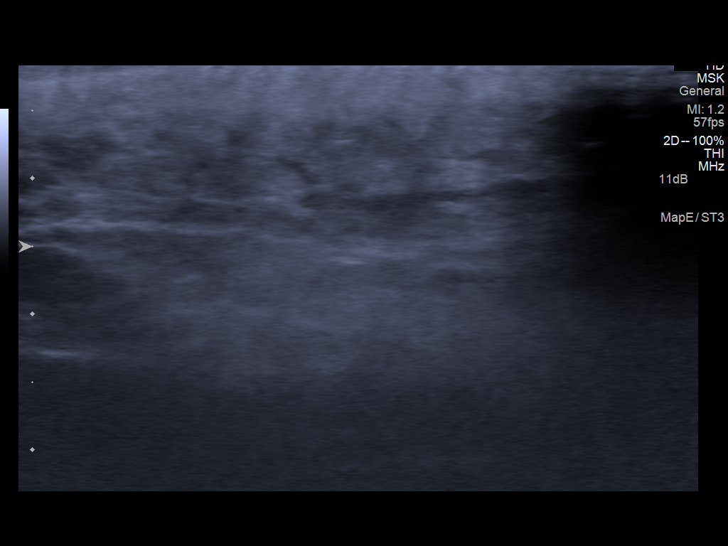
[im 6/14]
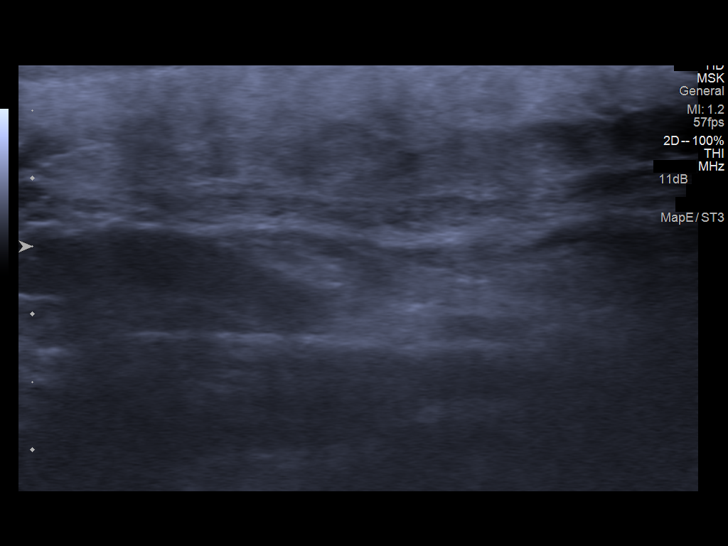
[im 7/14]
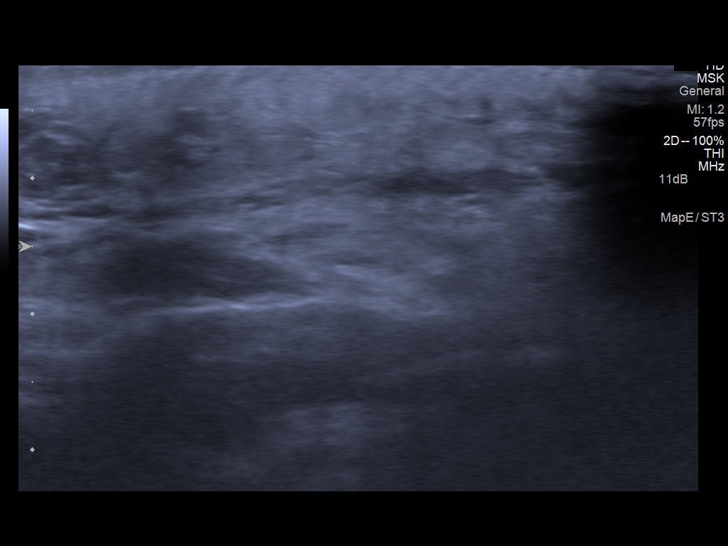
[im 8/14]
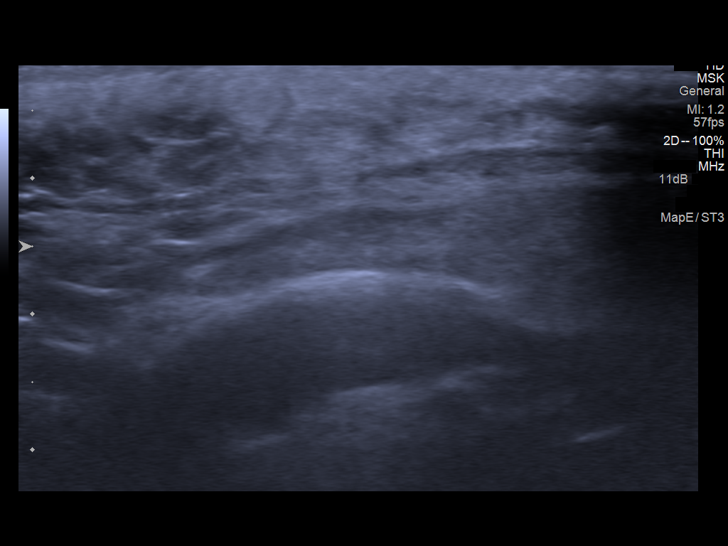
[im 9/14]
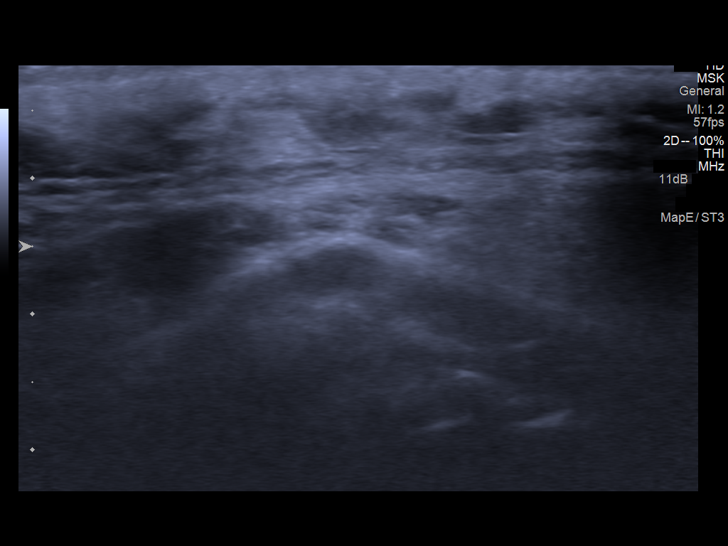
[im 10/14]
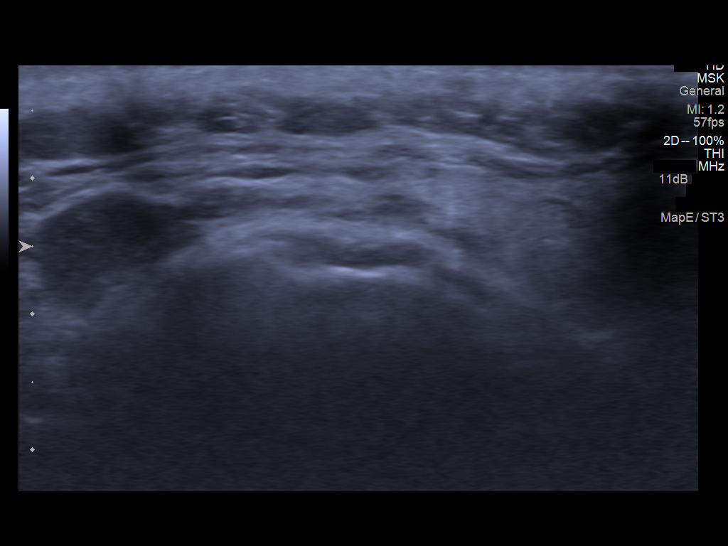
[im 11/14]
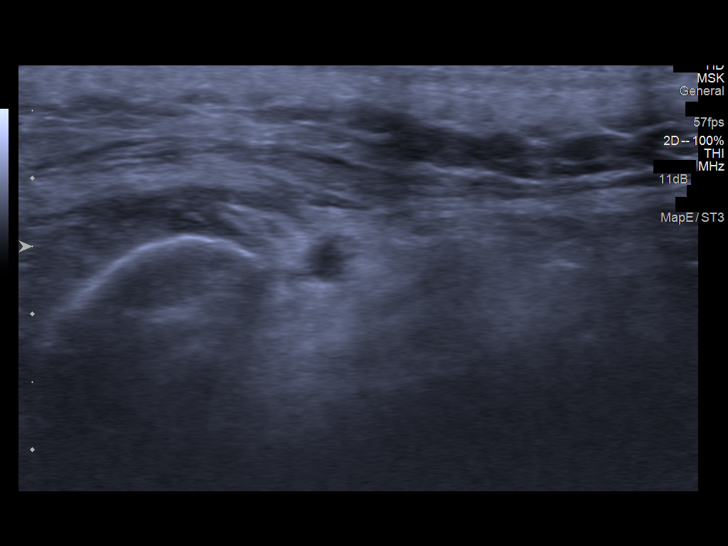
[im 12/14]
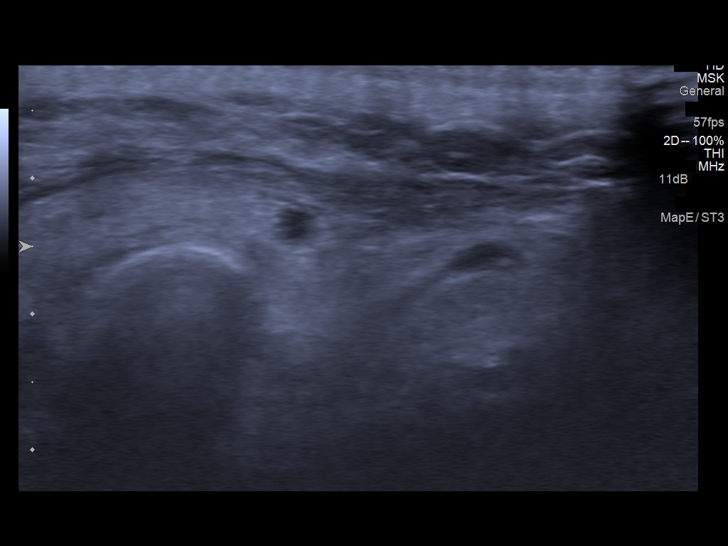
[im 13/14]
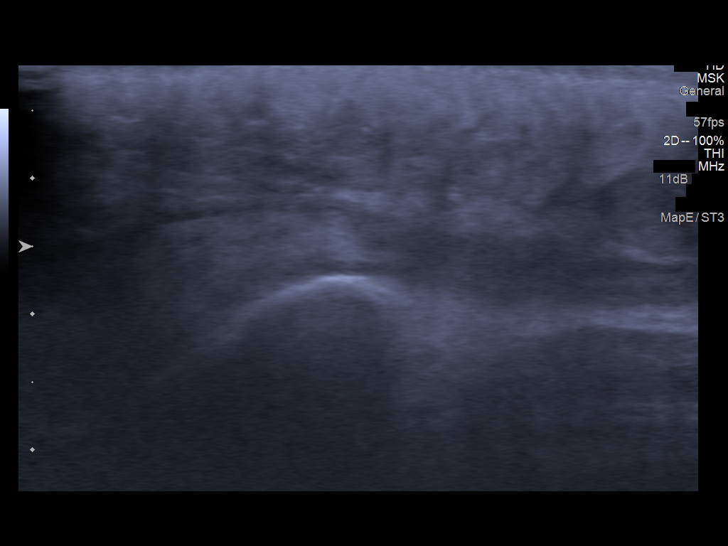
[im 14/14]
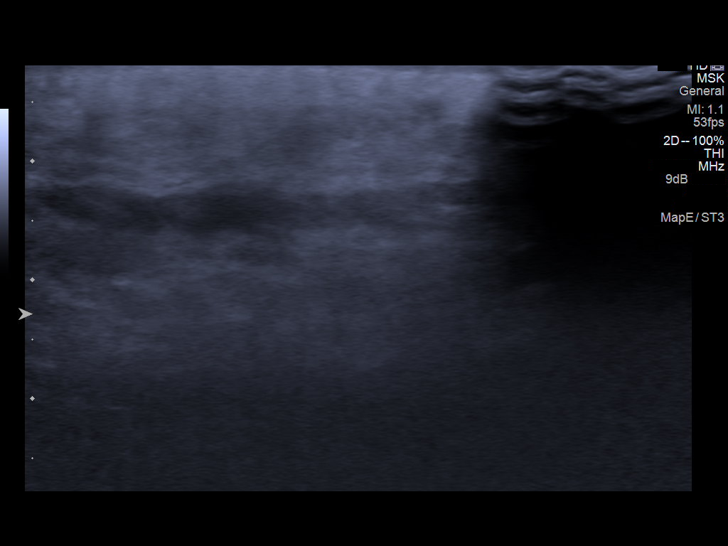

[14 of 14 positions shown; findings below may reference images not displayed]

FINDINGS: Provided grayscale images of the patient's area of concern are
negative for a discrete solid or cystic mass.
IMPRESSION: No ultrasound correlate for patient's area of concern involving the
left jaw. Further evaluation could be performed with either
maxillofacial CT or neck CT as clinically indicated.

## 2014-10-14 ENCOUNTER — Encounter: Payer: Self-pay | Admitting: *Deleted

## 2014-10-19 ENCOUNTER — Ambulatory Visit (INDEPENDENT_AMBULATORY_CARE_PROVIDER_SITE_OTHER): Payer: Managed Care, Other (non HMO) | Admitting: Family

## 2014-10-19 ENCOUNTER — Encounter: Payer: Self-pay | Admitting: Family

## 2014-10-19 VITALS — BP 130/73 | HR 89 | Temp 97.1°F | Ht 68.0 in | Wt 172.0 lb

## 2014-10-19 DIAGNOSIS — I10 Essential (primary) hypertension: Secondary | ICD-10-CM

## 2014-10-19 DIAGNOSIS — R739 Hyperglycemia, unspecified: Secondary | ICD-10-CM | POA: Diagnosis not present

## 2014-10-19 LAB — POCT GLYCOSYLATED HEMOGLOBIN (HGB A1C): HEMOGLOBIN A1C: 5.7

## 2014-10-19 NOTE — Progress Notes (Signed)
   Subjective:    Patient ID: Charles Beltran, male    DOB: [birth year], 63 y.o.   MRN: [id number]  Pt presents to the office today to recheck HTN. Pt's BP is at goal today! Hypertension This is a chronic problem. The current episode started more than 1 year ago. The problem has been resolved since onset. The problem is controlled. Pertinent negatives include no anxiety, headaches, malaise/fatigue, peripheral edema or shortness of breath. Risk factors for coronary artery disease include dyslipidemia and male gender. Past treatments include ACE inhibitors and diuretics. The current treatment provides significant improvement. There is no history of kidney disease, CAD/MI, CVA, heart failure or a thyroid problem. There is no history of sleep apnea.      Review of Systems  Constitutional: Negative.  Negative for malaise/fatigue.  HENT: Negative.   Respiratory: Negative.  Negative for shortness of breath.   Cardiovascular: Negative.   Gastrointestinal: Negative.   Endocrine: Negative.   Genitourinary: Negative.   Musculoskeletal: Negative.   Neurological: Negative.  Negative for headaches.  Hematological: Negative.   Psychiatric/Behavioral: Negative.   All other systems reviewed and are negative.      Objective:   Physical Exam  Constitutional: He is oriented to person, place, and time. He appears well-developed and well-nourished. No distress.  HENT:  Head: Normocephalic.  Eyes: Pupils are equal, round, and reactive to light. Right eye exhibits no discharge. Left eye exhibits no discharge.  Neck: Normal range of motion. Neck supple. No thyromegaly present.  Cardiovascular: Normal rate, regular rhythm and intact distal pulses.   Murmur heard. Pulmonary/Chest: Effort normal and breath sounds normal. No respiratory distress. He has no wheezes.  Abdominal: Soft. Bowel sounds are normal. He exhibits no distension. There is no tenderness. Hernia confirmed negative in the right inguinal area and  confirmed negative in the left inguinal area.  Genitourinary: Rectum normal, prostate normal and penis normal. Right testis shows no mass, no swelling and no tenderness. Left testis shows no mass, no swelling and no tenderness. No hypospadias, penile erythema or penile tenderness. No discharge found.  Musculoskeletal: Normal range of motion. He exhibits no edema or tenderness.  Neurological: He is alert and oriented to person, place, and time. He has normal reflexes. No cranial nerve deficit.  Skin: Skin is warm and dry. No rash noted. No erythema.  Psychiatric: He has a normal mood and affect. His behavior is normal. Judgment and thought content normal.  Vitals reviewed.     BP 130/73 mmHg  Pulse 89  Temp(Src) 97.1 F (36.2 C) (Oral)  Ht _0  (1.727 m)  Wt 172 lb (78.019 kg)  BMI 26.16 kg/m2     Assessment & Plan:  1. Essential hypertension -Dash diet information given -Exercise encouraged - Stress Management  -Continue current meds -RTO in 6 months - BMP8+EGFR  2. Blood glucose elevated - POCT glycosylated hemoglobin (Hb A1C)  Evelina Dun, FNP

## 2014-10-19 NOTE — Patient Instructions (Signed)

## 2014-10-20 ENCOUNTER — Other Ambulatory Visit: Payer: Self-pay | Admitting: Family

## 2014-10-20 LAB — BMP8+EGFR
BUN/Creatinine Ratio: 19 (ref 10–22)
BUN: 20 mg/dL (ref 8–27)
CO2: 24 mmol/L (ref 18–29)
Calcium: 9.8 mg/dL (ref 8.6–10.2)
Chloride: 99 mmol/L (ref 97–108)
Creatinine, Ser: 1.06 mg/dL (ref 0.76–1.27)
GFR calc Af Amer: 86 mL/min/{1.73_m2} (ref >59)
GFR calc non Af Amer: 74 mL/min/{1.73_m2} (ref >59)
GLUCOSE: 161 mg/dL — AB (ref 65–99)
Potassium: 4.5 mmol/L (ref 3.5–5.2)
Sodium: 140 mmol/L (ref 134–144)

## 2014-10-21 ENCOUNTER — Ambulatory Visit: Payer: Managed Care, Other (non HMO)

## 2014-10-21 NOTE — Progress Notes (Signed)
Patient aware.

## 2014-12-27 ENCOUNTER — Encounter: Payer: Self-pay | Admitting: Family

## 2014-12-27 ENCOUNTER — Ambulatory Visit (INDEPENDENT_AMBULATORY_CARE_PROVIDER_SITE_OTHER): Payer: Managed Care, Other (non HMO) | Admitting: Family

## 2014-12-27 ENCOUNTER — Ambulatory Visit: Payer: Managed Care, Other (non HMO) | Admitting: Family Medicine

## 2014-12-27 VITALS — BP 122/78 | HR 83 | Temp 97.6°F | Ht 68.0 in | Wt 172.2 lb

## 2014-12-27 DIAGNOSIS — A084 Viral intestinal infection, unspecified: Secondary | ICD-10-CM

## 2014-12-27 MED ORDER — ONDANSETRON HCL 4 MG PO TABS: 4.0000 mg | ORAL_TABLET | Freq: Three times a day (TID) | ORAL | Status: DC | PRN

## 2014-12-27 NOTE — Patient Instructions (Signed)

## 2014-12-27 NOTE — Progress Notes (Signed)
   Subjective:    Patient ID: Gayleen OremJames Ellingson, male    DOB: 02/07/1950, 64 y.o.   MRN: [id number]  Emesis  This is a new problem. The current episode started yesterday. The problem occurs 2 to 4 times per day. The problem has been waxing and waning. The emesis has an appearance of stomach contents. There has been no fever. Associated symptoms include abdominal pain, coughing, diarrhea and myalgias. Pertinent negatives include no chills, fever or headaches. He has tried bed rest and increased fluids for the symptoms. The treatment provided mild relief.      Review of Systems  Constitutional: Negative.  Negative for fever and chills.  HENT: Negative.   Respiratory: Positive for cough.   Cardiovascular: Negative.   Gastrointestinal: Positive for vomiting, abdominal pain and diarrhea.  Endocrine: Negative.   Genitourinary: Negative.   Musculoskeletal: Positive for myalgias.  Neurological: Negative.  Negative for headaches.  Hematological: Negative.   Psychiatric/Behavioral: Negative.   All other systems reviewed and are negative.      Objective:   Physical Exam  Constitutional: He is oriented to person, place, and time. He appears well-developed and well-nourished. No distress.  HENT:  Head: Normocephalic.  Eyes: Pupils are equal, round, and reactive to light. Right eye exhibits no discharge. Left eye exhibits no discharge.  Neck: Normal range of motion. Neck supple. No thyromegaly present.  Cardiovascular: Normal rate, regular rhythm and intact distal pulses.   Murmur heard. Pulmonary/Chest: Effort normal and breath sounds normal. No respiratory distress. He has no wheezes.  Abdominal: Soft. Bowel sounds are normal. He exhibits no distension. There is no tenderness.  Musculoskeletal: Normal range of motion. He exhibits no edema or tenderness.  Neurological: He is alert and oriented to person, place, and time. He has normal reflexes. No cranial nerve deficit.  Skin: Skin is warm and dry.  No rash noted. No erythema.  Psychiatric: He has a normal mood and affect. His behavior is normal. Judgment and thought content normal.  Vitals reviewed.     BP 122/78 mmHg  Pulse 83  Temp(Src) 97.6 F (36.4 C)  Ht 5\' 8"  (1.727 m)  Wt 172 lb 3.2 oz (78.109 kg)  BMI 26.19 kg/m2     Assessment & Plan:  1. Viral gastroenteritis -Force fluids -Bland diet Tylenol or Motrin prn for pain or fever -RTO prn  - ondansetron (ZOFRAN) 4 MG tablet; Take 1 tablet (4 mg total) by mouth every 8 (eight) hours as needed for nausea or vomiting.  Dispense: 20 tablet; Refill: 0  Jannifer Rodneyhristy Itzia Cunliffe, FNP

## 2015-01-21 ENCOUNTER — Ambulatory Visit (INDEPENDENT_AMBULATORY_CARE_PROVIDER_SITE_OTHER): Payer: Managed Care, Other (non HMO) | Admitting: Family

## 2015-01-21 ENCOUNTER — Encounter: Payer: Self-pay | Admitting: Family

## 2015-01-21 VITALS — BP 140/81 | HR 87 | Temp 97.6°F | Ht 68.0 in | Wt 176.2 lb

## 2015-01-21 DIAGNOSIS — I10 Essential (primary) hypertension: Secondary | ICD-10-CM | POA: Diagnosis not present

## 2015-01-21 DIAGNOSIS — E559 Vitamin D deficiency, unspecified: Secondary | ICD-10-CM

## 2015-01-21 DIAGNOSIS — E785 Hyperlipidemia, unspecified: Secondary | ICD-10-CM | POA: Diagnosis not present

## 2015-01-21 NOTE — Patient Instructions (Signed)

## 2015-01-21 NOTE — Progress Notes (Signed)
   Subjective:    Patient ID: Charles Beltran, male    DOB: 1950/02/25, 64 y.o.   MRN: [id number]  PT presents to the office today for chronic follow up.  Hypertension This is a chronic problem. The current episode started more than 1 year ago. The problem has been resolved since onset. The problem is controlled. Pertinent negatives include no anxiety, headaches, palpitations, peripheral edema or shortness of breath. Risk factors for coronary artery disease include dyslipidemia, male gender and sedentary lifestyle. Past treatments include ACE inhibitors and diuretics. The current treatment provides moderate improvement. There is no history of kidney disease, CAD/MI, CVA, heart failure or a thyroid problem. There is no history of sleep apnea.  Hyperlipidemia This is a chronic problem. The current episode started more than 1 year ago. The problem is controlled. Recent lipid tests were reviewed and are normal. He has no history of diabetes. Pertinent negatives include no leg pain or shortness of breath. Current antihyperlipidemic treatment includes statins. The current treatment provides moderate improvement of lipids. Risk factors for coronary artery disease include dyslipidemia, hypertension and male sex.      Review of Systems  Constitutional: Negative.   HENT: Negative.   Respiratory: Negative.  Negative for shortness of breath.   Cardiovascular: Negative.  Negative for palpitations.  Gastrointestinal: Negative.   Endocrine: Negative.   Genitourinary: Negative.   Musculoskeletal: Negative.   Neurological: Negative.  Negative for headaches.  Hematological: Negative.   Psychiatric/Behavioral: Negative.   All other systems reviewed and are negative.      Objective:   Physical Exam  Constitutional: He is oriented to person, place, and time. He appears well-developed and well-nourished. No distress.  HENT:  Head: Normocephalic.  Right Ear: External ear normal.  Left Ear: External ear normal.   Nose: Nose normal.  Mouth/Throat: Oropharynx is clear and moist.  Eyes: Pupils are equal, round, and reactive to light. Right eye exhibits no discharge. Left eye exhibits no discharge.  Neck: Normal range of motion. Neck supple. No thyromegaly present.  Cardiovascular: Normal rate, regular rhythm and intact distal pulses.   Murmur heard. Pulmonary/Chest: Effort normal and breath sounds normal. No respiratory distress. He has no wheezes.  Abdominal: Soft. Bowel sounds are normal. He exhibits no distension. There is no tenderness.  Genitourinary: Rectum normal, prostate normal and penis normal. No penile tenderness.  Musculoskeletal: Normal range of motion. He exhibits no edema or tenderness.  Neurological: He is alert and oriented to person, place, and time. He has normal reflexes. No cranial nerve deficit.  Skin: Skin is warm and dry. No rash noted. No erythema.  Psychiatric: He has a normal mood and affect. His behavior is normal. Judgment and thought content normal.  Vitals reviewed.     BP 140/81 mmHg  Pulse 87  Temp(Src) 97.6 F (36.4 C) (Oral)  Ht '5\' 8"'$  (1.727 m)  Wt 176 lb 3.2 oz (79.924 kg)  BMI 26.80 kg/m2     Assessment & Plan:  1. Essential hypertension - CMP14+EGFR  2. Vitamin D deficiency - CMP14+EGFR - VITAMIN D 25 Hydroxy (Vit-D Deficiency, Fractures)  3. Hyperlipidemia - CMP14+EGFR - Lipid panel   Continue all meds Labs pending Health Maintenance reviewed Diet and exercise encouraged RTO 3 months  Evelina Dun, FNP

## 2015-01-22 LAB — LIPID PANEL
CHOLESTEROL TOTAL: 118 mg/dL (ref 100–199)
Chol/HDL Ratio: 1.9 ratio units (ref 0.0–5.0)
HDL: 62 mg/dL (ref >39)
LDL CALC: 42 mg/dL (ref 0–99)
TRIGLYCERIDES: 70 mg/dL (ref 0–149)
VLDL CHOLESTEROL CAL: 14 mg/dL (ref 5–40)

## 2015-01-22 LAB — CMP14+EGFR
ALBUMIN: 4.5 g/dL (ref 3.6–4.8)
ALT: 29 IU/L (ref 0–44)
AST: 25 IU/L (ref 0–40)
Albumin/Globulin Ratio: 2 (ref 1.1–2.5)
Alkaline Phosphatase: 56 IU/L (ref 39–117)
BUN/Creatinine Ratio: 20 (ref 10–22)
BUN: 19 mg/dL (ref 8–27)
Bilirubin Total: 0.7 mg/dL (ref 0.0–1.2)
CALCIUM: 9.5 mg/dL (ref 8.6–10.2)
CO2: 25 mmol/L (ref 18–29)
CREATININE: 0.96 mg/dL (ref 0.76–1.27)
Chloride: 98 mmol/L (ref 96–106)
GFR calc Af Amer: 97 mL/min/{1.73_m2} (ref >59)
GFR calc non Af Amer: 84 mL/min/{1.73_m2} (ref >59)
GLOBULIN, TOTAL: 2.3 g/dL (ref 1.5–4.5)
GLUCOSE: 138 mg/dL — AB (ref 65–99)
Potassium: 5.1 mmol/L (ref 3.5–5.2)
Sodium: 140 mmol/L (ref 134–144)
TOTAL PROTEIN: 6.8 g/dL (ref 6.0–8.5)

## 2015-01-22 LAB — VITAMIN D 25 HYDROXY (VIT D DEFICIENCY, FRACTURES): Vit D, 25-Hydroxy: 36.6 ng/mL (ref 30.0–100.0)

## 2015-01-27 ENCOUNTER — Encounter: Payer: Self-pay | Admitting: *Deleted

## 2015-02-11 ENCOUNTER — Telehealth: Payer: Self-pay | Admitting: Family

## 2015-02-11 DIAGNOSIS — E785 Hyperlipidemia, unspecified: Secondary | ICD-10-CM

## 2015-02-11 DIAGNOSIS — I10 Essential (primary) hypertension: Secondary | ICD-10-CM

## 2015-02-11 MED ORDER — ATORVASTATIN CALCIUM 40 MG PO TABS: 40.0000 mg | ORAL_TABLET | Freq: Every day | ORAL | Status: DC

## 2015-02-11 MED ORDER — LISINOPRIL-HYDROCHLOROTHIAZIDE 20-12.5 MG PO TABS: 2.0000 | ORAL_TABLET | Freq: Every day | ORAL | Status: DC

## 2015-02-11 NOTE — Telephone Encounter (Signed)
done

## 2015-04-01 ENCOUNTER — Encounter: Payer: Self-pay | Admitting: Family

## 2015-04-01 ENCOUNTER — Ambulatory Visit (INDEPENDENT_AMBULATORY_CARE_PROVIDER_SITE_OTHER): Payer: Managed Care, Other (non HMO) | Admitting: Family

## 2015-04-01 VITALS — BP 133/69 | HR 86 | Temp 98.7°F | Ht 68.0 in | Wt 182.2 lb

## 2015-04-01 DIAGNOSIS — B354 Tinea corporis: Secondary | ICD-10-CM | POA: Diagnosis not present

## 2015-04-01 MED ORDER — NAFTIFINE HCL 1 % EX CREA: TOPICAL_CREAM | Freq: Every day | CUTANEOUS | Status: DC

## 2015-04-01 NOTE — Patient Instructions (Signed)

## 2015-04-01 NOTE — Progress Notes (Signed)
   Subjective:    Patient ID: Charles Beltran, male    DOB: [birth year], 65 y.o.   MRN: [id number]  Rash This is a new problem. The current episode started 1 to 4 weeks ago. The problem is unchanged. The affected locations include the right wrist and left hand. The rash is characterized by redness, peeling and dryness. He was exposed to nothing. Pertinent negatives include no congestion, diarrhea, joint pain, sore throat or vomiting. Past treatments include oral steroids. The treatment provided no relief.      Review of Systems  Constitutional: Negative.   HENT: Negative.  Negative for congestion and sore throat.   Respiratory: Negative.   Cardiovascular: Negative.   Gastrointestinal: Negative.  Negative for vomiting and diarrhea.  Endocrine: Negative.   Genitourinary: Negative.   Musculoskeletal: Negative.  Negative for joint pain.  Skin: Positive for rash.  Neurological: Negative.   Hematological: Negative.   Psychiatric/Behavioral: Negative.   All other systems reviewed and are negative.      Objective:   Physical Exam  Constitutional: He is oriented to person, place, and time. He appears well-developed and well-nourished. No distress.  HENT:  Head: Normocephalic.  Eyes: Pupils are equal, round, and reactive to light. Right eye exhibits no discharge. Left eye exhibits no discharge.  Neck: Normal range of motion. Neck supple. No thyromegaly present.  Cardiovascular: Normal rate, regular rhythm, normal heart sounds and intact distal pulses.   No murmur heard. Pulmonary/Chest: Effort normal and breath sounds normal. No respiratory distress. He has no wheezes.  Abdominal: Soft. Bowel sounds are normal. He exhibits no distension. There is no tenderness.  Musculoskeletal: Normal range of motion. He exhibits no edema or tenderness.  Neurological: He is alert and oriented to person, place, and time.  Skin: Skin is warm and dry. Rash noted. There is erythema.  Erythemas scaly, crusted  rash on right wrist, left hand, and left knee   Psychiatric: He has a normal mood and affect. His behavior is normal. Judgment and thought content normal.  Vitals reviewed.    BP 133/69 mmHg  Pulse 86  Temp(Src) 98.7 F (37.1 C) (Oral)  Ht  (1.727 m)  Wt 182 lb 3.2 oz (82.645 kg)  BMI 27.71 kg/m2      Assessment & Plan:  1. Tinea corporis -Do not scratch  -Good hand hygiene  -Wash your sheets -RTO prn  - naftifine (NAFTIN) 1 % cream; Apply topically daily.  Dispense: 30 g; Refill: 0  Jannifer Rodney, FNP

## 2015-04-21 ENCOUNTER — Encounter: Payer: Self-pay | Admitting: Family

## 2015-04-21 ENCOUNTER — Ambulatory Visit (INDEPENDENT_AMBULATORY_CARE_PROVIDER_SITE_OTHER): Payer: Managed Care, Other (non HMO) | Admitting: Family

## 2015-04-21 VITALS — BP 140/72 | HR 79 | Temp 97.1°F | Ht 68.0 in | Wt 178.0 lb

## 2015-04-21 DIAGNOSIS — E785 Hyperlipidemia, unspecified: Secondary | ICD-10-CM

## 2015-04-21 DIAGNOSIS — E559 Vitamin D deficiency, unspecified: Secondary | ICD-10-CM

## 2015-04-21 DIAGNOSIS — I10 Essential (primary) hypertension: Secondary | ICD-10-CM | POA: Diagnosis not present

## 2015-04-21 DIAGNOSIS — L259 Unspecified contact dermatitis, unspecified cause: Secondary | ICD-10-CM | POA: Diagnosis not present

## 2015-04-21 MED ORDER — TRIAMCINOLONE ACETONIDE 0.5 % EX OINT: 1.0000 "application " | TOPICAL_OINTMENT | Freq: Two times a day (BID) | CUTANEOUS | Status: DC

## 2015-04-21 MED ORDER — PREDNISONE 10 MG (21) PO TBPK: 10.0000 mg | ORAL_TABLET | Freq: Every day | ORAL | Status: DC

## 2015-04-21 NOTE — Patient Instructions (Signed)

## 2015-04-21 NOTE — Progress Notes (Signed)
Subjective:    Patient ID: Charles Beltran, male    DOB: Jun 08, 1950, 65 y.o.   MRN: [id number]  PT presents to the office today for chronic follow up.  Hypertension This is a chronic problem. The current episode started more than 1 year ago. The problem has been resolved since onset. The problem is controlled. Pertinent negatives include no anxiety, headaches, palpitations, peripheral edema or shortness of breath. Risk factors for coronary artery disease include dyslipidemia, male gender and sedentary lifestyle. Past treatments include ACE inhibitors and diuretics. The current treatment provides moderate improvement. There is no history of kidney disease, CAD/MI, CVA, heart failure or a thyroid problem. There is no history of sleep apnea.  Hyperlipidemia This is a chronic problem. The current episode started more than 1 year ago. The problem is controlled. Recent lipid tests were reviewed and are normal. He has no history of diabetes. Pertinent negatives include no leg pain or shortness of breath. Current antihyperlipidemic treatment includes statins. The current treatment provides moderate improvement of lipids. Risk factors for coronary artery disease include dyslipidemia, hypertension and male sex.  Rash This is a new problem. The current episode started more than 1 month ago. The problem is unchanged. The affected locations include the left arm and right wrist. The rash is characterized by dryness, itchiness and redness. He was exposed to nothing. Pertinent negatives include no shortness of breath. Past treatments include anti-itch cream and cold compress. The treatment provided mild relief.      Review of Systems  Constitutional: Negative.   HENT: Negative.   Respiratory: Negative.  Negative for shortness of breath.   Cardiovascular: Negative.  Negative for palpitations.  Gastrointestinal: Negative.   Endocrine: Negative.   Genitourinary: Negative.   Musculoskeletal: Negative.   Skin:  Positive for rash.  Neurological: Negative.  Negative for headaches.  Hematological: Negative.   Psychiatric/Behavioral: Negative.   All other systems reviewed and are negative.      Objective:   Physical Exam  Constitutional: He is oriented to person, place, and time. He appears well-developed and well-nourished. No distress.  HENT:  Head: Normocephalic.  Right Ear: External ear normal.  Left Ear: External ear normal.  Nose: Nose normal.  Mouth/Throat: Oropharynx is clear and moist.  Eyes: Pupils are equal, round, and reactive to light. Right eye exhibits no discharge. Left eye exhibits no discharge.  Neck: Normal range of motion. Neck supple. No thyromegaly present.  Cardiovascular: Normal rate, regular rhythm and intact distal pulses.   Murmur heard. Pulmonary/Chest: Effort normal and breath sounds normal. No respiratory distress. He has no wheezes.  Abdominal: Soft. Bowel sounds are normal. He exhibits no distension. There is no tenderness.  Genitourinary: Rectum normal, prostate normal and penis normal. No penile tenderness.  Musculoskeletal: Normal range of motion. He exhibits no edema or tenderness.  Neurological: He is alert and oriented to person, place, and time. He has normal reflexes. No cranial nerve deficit.  Skin: Skin is warm and dry. Rash noted. There is erythema.  Generalized erythemas dry rash in right wrist, left hand, left upper arm  Psychiatric: He has a normal mood and affect. His behavior is normal. Judgment and thought content normal.  Vitals reviewed.     BP 140/72 mmHg  Pulse 79  Temp(Src) 97.1 F (36.2 C) (Oral)  Ht '5\' 8"'  (1.727 m)  Wt 178 lb (80.74 kg)  BMI 27.07 kg/m2     Assessment & Plan:  1. Vitamin D deficiency - CMP14+EGFR - VITAMIN D  25 Hydroxy (Vit-D Deficiency, Fractures)  2. Hyperlipidemia - CMP14+EGFR - Lipid panel  3. Essential hypertension - CMP14+EGFR - VITAMIN D 25 Hydroxy (Vit-D Deficiency, Fractures)  4. Contact  dermatitis - triamcinolone ointment (KENALOG) 0.5 %; Apply 1 application topically 2 (two) times daily.  Dispense: 30 g; Refill: 0 - predniSONE (STERAPRED UNI-PAK 21 TAB) 10 MG (21) TBPK tablet; Take 1 tablet (10 mg total) by mouth daily. As directed x 6 days  Dispense: 21 tablet; Refill: 0   Continue all meds Labs pending Health Maintenance reviewed Diet and exercise encouraged RTO 3 months  Evelina Dun, FNP

## 2015-04-22 LAB — CMP14+EGFR
A/G RATIO: 2 (ref 1.2–2.2)
ALK PHOS: 54 IU/L (ref 39–117)
ALT: 36 IU/L (ref 0–44)
AST: 30 IU/L (ref 0–40)
Albumin: 4.5 g/dL (ref 3.6–4.8)
BUN/Creatinine Ratio: 20 (ref 10–22)
BUN: 20 mg/dL (ref 8–27)
Bilirubin Total: 0.8 mg/dL (ref 0.0–1.2)
CHLORIDE: 95 mmol/L — AB (ref 96–106)
CO2: 23 mmol/L (ref 18–29)
Calcium: 9.3 mg/dL (ref 8.6–10.2)
Creatinine, Ser: 0.99 mg/dL (ref 0.76–1.27)
GFR calc Af Amer: 93 mL/min/{1.73_m2} (ref >59)
GFR, EST NON AFRICAN AMERICAN: 80 mL/min/{1.73_m2} (ref >59)
Globulin, Total: 2.2 g/dL (ref 1.5–4.5)
Glucose: 136 mg/dL — ABNORMAL HIGH (ref 65–99)
POTASSIUM: 4.6 mmol/L (ref 3.5–5.2)
SODIUM: 137 mmol/L (ref 134–144)
Total Protein: 6.7 g/dL (ref 6.0–8.5)

## 2015-04-22 LAB — VITAMIN D 25 HYDROXY (VIT D DEFICIENCY, FRACTURES): Vit D, 25-Hydroxy: 41.3 ng/mL (ref 30.0–100.0)

## 2015-04-22 LAB — LIPID PANEL
CHOL/HDL RATIO: 2.2 ratio (ref 0.0–5.0)
CHOLESTEROL TOTAL: 130 mg/dL (ref 100–199)
HDL: 58 mg/dL (ref >39)
LDL Calculated: 59 mg/dL (ref 0–99)
TRIGLYCERIDES: 65 mg/dL (ref 0–149)
VLDL Cholesterol Cal: 13 mg/dL (ref 5–40)

## 2015-05-31 ENCOUNTER — Other Ambulatory Visit: Payer: Self-pay | Admitting: Family

## 2015-06-26 ENCOUNTER — Other Ambulatory Visit: Payer: Self-pay | Admitting: Family

## 2015-07-08 ENCOUNTER — Other Ambulatory Visit: Payer: Self-pay | Admitting: Family

## 2015-07-22 ENCOUNTER — Ambulatory Visit (INDEPENDENT_AMBULATORY_CARE_PROVIDER_SITE_OTHER): Payer: Managed Care, Other (non HMO) | Admitting: Family

## 2015-07-22 ENCOUNTER — Encounter: Payer: Self-pay | Admitting: Family

## 2015-07-22 VITALS — BP 138/74 | HR 85 | Temp 98.5°F | Ht 68.0 in | Wt 178.0 lb

## 2015-07-22 DIAGNOSIS — I10 Essential (primary) hypertension: Secondary | ICD-10-CM | POA: Diagnosis not present

## 2015-07-22 DIAGNOSIS — E785 Hyperlipidemia, unspecified: Secondary | ICD-10-CM

## 2015-07-22 DIAGNOSIS — E663 Overweight: Secondary | ICD-10-CM | POA: Insufficient documentation

## 2015-07-22 DIAGNOSIS — Z1211 Encounter for screening for malignant neoplasm of colon: Secondary | ICD-10-CM

## 2015-07-22 DIAGNOSIS — Z125 Encounter for screening for malignant neoplasm of prostate: Secondary | ICD-10-CM

## 2015-07-22 DIAGNOSIS — Z114 Encounter for screening for human immunodeficiency virus [HIV]: Secondary | ICD-10-CM

## 2015-07-22 DIAGNOSIS — Z0000 Encounter for general adult medical examination without abnormal findings: Secondary | ICD-10-CM | POA: Diagnosis not present

## 2015-07-22 DIAGNOSIS — E559 Vitamin D deficiency, unspecified: Secondary | ICD-10-CM

## 2015-07-22 NOTE — Addendum Note (Signed)
Addended by: Almeta MonasSTONE, JANIE M on: 07/22/2015 11:05 AM   Modules accepted: Kipp BroodSmartSet

## 2015-07-22 NOTE — Patient Instructions (Signed)

## 2015-07-22 NOTE — Progress Notes (Signed)
Subjective:    Patient ID: Charles Beltran, male    DOB: 1950/04/07, 65 y.o.   MRN: [id number]  PT presents to the office today for chronic follow up.  Hypertension This is a chronic problem. The current episode started more than 1 year ago. The problem has been resolved since onset. The problem is controlled. Pertinent negatives include no anxiety, headaches, palpitations, peripheral edema or shortness of breath. Risk factors for coronary artery disease include dyslipidemia, male gender and sedentary lifestyle. Past treatments include ACE inhibitors and diuretics. The current treatment provides moderate improvement. There is no history of kidney disease, CAD/MI, CVA, heart failure or a thyroid problem. There is no history of sleep apnea.  Hyperlipidemia This is a chronic problem. The current episode started more than 1 year ago. The problem is controlled. Recent lipid tests were reviewed and are normal. He has no history of diabetes. Pertinent negatives include no leg pain or shortness of breath. Current antihyperlipidemic treatment includes statins. The current treatment provides moderate improvement of lipids. Risk factors for coronary artery disease include dyslipidemia, hypertension and male sex.  Rash This is a new problem. The current episode started more than 1 month ago. The problem is unchanged. The affected locations include the left arm and right wrist. The rash is characterized by dryness, itchiness and redness. He was exposed to nothing. Pertinent negatives include no shortness of breath. Past treatments include anti-itch cream and cold compress. The treatment provided mild relief.      Review of Systems  Constitutional: Negative.   HENT: Negative.   Respiratory: Negative.  Negative for shortness of breath.   Cardiovascular: Negative.  Negative for palpitations.  Gastrointestinal: Negative.   Endocrine: Negative.   Genitourinary: Negative.   Musculoskeletal: Negative.   Skin:  Positive for rash.  Neurological: Negative.  Negative for headaches.  Hematological: Negative.   Psychiatric/Behavioral: Negative.   All other systems reviewed and are negative.      Objective:   Physical Exam  Constitutional: He is oriented to person, place, and time. He appears well-developed and well-nourished. No distress.  HENT:  Head: Normocephalic.  Right Ear: External ear normal.  Left Ear: External ear normal.  Nose: Nose normal.  Mouth/Throat: Oropharynx is clear and moist.  Eyes: Pupils are equal, round, and reactive to light. Right eye exhibits no discharge. Left eye exhibits no discharge.  Neck: Normal range of motion. Neck supple. No thyromegaly present.  Cardiovascular: Normal rate, regular rhythm and intact distal pulses.   Murmur heard. Pulmonary/Chest: Effort normal and breath sounds normal. No respiratory distress. He has no wheezes.  Abdominal: Soft. Bowel sounds are normal. He exhibits no distension. There is no tenderness.  Genitourinary: Rectum normal, prostate normal and penis normal. No penile tenderness.  Musculoskeletal: Normal range of motion. He exhibits no edema or tenderness.  Neurological: He is alert and oriented to person, place, and time. He has normal reflexes. No cranial nerve deficit.  Skin: Skin is warm and dry. No rash noted. No erythema.  Psychiatric: He has a normal mood and affect. His behavior is normal. Judgment and thought content normal.  Vitals reviewed.     BP 138/74 mmHg  Pulse 85  Temp(Src) 98.5 F (36.9 C) (Oral)  Ht '5\' 8"'  (1.727 m)  Wt 178 lb (80.74 kg)  BMI 27.07 kg/m2     Assessment & Plan:  1. Essential hypertension - CMP14+EGFR  2. Hyperlipidemia - CMP14+EGFR - Lipid panel  3. Vitamin D deficiency - CMP14+EGFR - VITAMIN  D 25 Hydroxy (Vit-D Deficiency, Fractures)  4. Overweight (BMI 25.0-29.9) - CMP14+EGFR  5. Annual physical exam - Anemia Profile B - CMP14+EGFR - Lipid panel - Thyroid Panel With  TSH - VITAMIN D 25 Hydroxy (Vit-D Deficiency, Fractures) - HIV antibody - EKG 12-Lead  6. Screening for HIV (human immunodeficiency virus) - CMP14+EGFR - HIV antibody  7. Prostate cancer screening - PSA, total and free  8. Colon cancer screening - Fecal occult blood, imunochemical; Future   Continue all meds Labs pending Health Maintenance reviewed Diet and exercise encouraged RTO 6 months  Evelina Dun, FNP

## 2015-07-23 LAB — ANEMIA PROFILE B
BASOS ABS: 0 10*3/uL (ref 0.0–0.2)
Basos: 0 %
EOS (ABSOLUTE): 0.1 10*3/uL (ref 0.0–0.4)
Eos: 1 %
Ferritin: 88 ng/mL (ref 30–400)
Hematocrit: 43 % (ref 37.5–51.0)
Hemoglobin: 15.2 g/dL (ref 12.6–17.7)
IMMATURE GRANS (ABS): 0 10*3/uL (ref 0.0–0.1)
IMMATURE GRANULOCYTES: 0 %
Iron Saturation: 39 % (ref 15–55)
Iron: 128 ug/dL (ref 38–169)
LYMPHS ABS: 0.6 10*3/uL — AB (ref 0.7–3.1)
LYMPHS: 6 %
MCH: 30.8 pg (ref 26.6–33.0)
MCHC: 35.3 g/dL (ref 31.5–35.7)
MCV: 87 fL (ref 79–97)
MONOS ABS: 0.7 10*3/uL (ref 0.1–0.9)
Monocytes: 8 %
NEUTROS PCT: 85 %
Neutrophils Absolute: 7.6 10*3/uL — ABNORMAL HIGH (ref 1.4–7.0)
PLATELETS: 171 10*3/uL (ref 150–379)
RBC: 4.94 x10E6/uL (ref 4.14–5.80)
RDW: 13.4 % (ref 12.3–15.4)
Retic Ct Pct: 1.4 % (ref 0.6–2.6)
TIBC: 327 ug/dL (ref 250–450)
UIBC: 199 ug/dL (ref 111–343)
Vitamin B-12: 671 pg/mL (ref 211–946)
WBC: 8.9 10*3/uL (ref 3.4–10.8)

## 2015-07-23 LAB — CMP14+EGFR
ALK PHOS: 58 IU/L (ref 39–117)
ALT: 43 IU/L (ref 0–44)
AST: 27 IU/L (ref 0–40)
Albumin/Globulin Ratio: 2.2 (ref 1.2–2.2)
Albumin: 4.7 g/dL (ref 3.6–4.8)
BILIRUBIN TOTAL: 0.6 mg/dL (ref 0.0–1.2)
BUN / CREAT RATIO: 18 (ref 10–24)
BUN: 17 mg/dL (ref 8–27)
CHLORIDE: 98 mmol/L (ref 96–106)
CO2: 26 mmol/L (ref 18–29)
Calcium: 9.7 mg/dL (ref 8.6–10.2)
Creatinine, Ser: 0.96 mg/dL (ref 0.76–1.27)
GFR calc Af Amer: 96 mL/min/{1.73_m2} (ref >59)
GFR calc non Af Amer: 83 mL/min/{1.73_m2} (ref >59)
GLUCOSE: 125 mg/dL — AB (ref 65–99)
Globulin, Total: 2.1 g/dL (ref 1.5–4.5)
Potassium: 4.7 mmol/L (ref 3.5–5.2)
SODIUM: 141 mmol/L (ref 134–144)
Total Protein: 6.8 g/dL (ref 6.0–8.5)

## 2015-07-23 LAB — LIPID PANEL
CHOLESTEROL TOTAL: 137 mg/dL (ref 100–199)
Chol/HDL Ratio: 2.3 ratio units (ref 0.0–5.0)
HDL: 60 mg/dL (ref >39)
LDL CALC: 58 mg/dL (ref 0–99)
Triglycerides: 96 mg/dL (ref 0–149)
VLDL Cholesterol Cal: 19 mg/dL (ref 5–40)

## 2015-07-23 LAB — THYROID PANEL WITH TSH
FREE THYROXINE INDEX: 2.2 (ref 1.2–4.9)
T3 Uptake Ratio: 27 % (ref 24–39)
T4 TOTAL: 8.2 ug/dL (ref 4.5–12.0)
TSH: 2.45 u[IU]/mL (ref 0.450–4.500)

## 2015-07-23 LAB — PSA, TOTAL AND FREE
PSA FREE PCT: 37.1 %
PSA, Free: 0.52 ng/mL
Prostate Specific Ag, Serum: 1.4 ng/mL (ref 0.0–4.0)

## 2015-07-23 LAB — VITAMIN D 25 HYDROXY (VIT D DEFICIENCY, FRACTURES): VIT D 25 HYDROXY: 41.5 ng/mL (ref 30.0–100.0)

## 2015-07-23 LAB — HIV ANTIBODY (ROUTINE TESTING W REFLEX): HIV Screen 4th Generation wRfx: NONREACTIVE

## 2015-08-11 ENCOUNTER — Encounter: Payer: Self-pay | Admitting: *Deleted

## 2015-08-18 ENCOUNTER — Ambulatory Visit (INDEPENDENT_AMBULATORY_CARE_PROVIDER_SITE_OTHER): Payer: Managed Care, Other (non HMO) | Admitting: *Deleted

## 2015-08-18 DIAGNOSIS — Z23 Encounter for immunization: Secondary | ICD-10-CM

## 2015-08-18 NOTE — Progress Notes (Signed)
Pt given Zostavax SubQ left upper arm and tolerated well.

## 2015-09-27 ENCOUNTER — Other Ambulatory Visit: Payer: Self-pay | Admitting: Family

## 2015-09-30 ENCOUNTER — Other Ambulatory Visit: Payer: Self-pay | Admitting: Family

## 2015-10-25 ENCOUNTER — Ambulatory Visit (INDEPENDENT_AMBULATORY_CARE_PROVIDER_SITE_OTHER): Payer: Managed Care, Other (non HMO) | Admitting: Family

## 2015-10-25 ENCOUNTER — Encounter: Payer: Self-pay | Admitting: Family

## 2015-10-25 VITALS — BP 139/81 | HR 83 | Temp 97.8°F | Ht 68.0 in | Wt 176.6 lb

## 2015-10-25 DIAGNOSIS — I10 Essential (primary) hypertension: Secondary | ICD-10-CM | POA: Diagnosis not present

## 2015-10-25 DIAGNOSIS — E559 Vitamin D deficiency, unspecified: Secondary | ICD-10-CM

## 2015-10-25 DIAGNOSIS — E663 Overweight: Secondary | ICD-10-CM | POA: Diagnosis not present

## 2015-10-25 DIAGNOSIS — R7301 Impaired fasting glucose: Secondary | ICD-10-CM

## 2015-10-25 DIAGNOSIS — E785 Hyperlipidemia, unspecified: Secondary | ICD-10-CM | POA: Diagnosis not present

## 2015-10-25 DIAGNOSIS — Z1211 Encounter for screening for malignant neoplasm of colon: Secondary | ICD-10-CM | POA: Diagnosis not present

## 2015-10-25 LAB — BAYER DCA HB A1C WAIVED: HB A1C: 5.6 % (ref <7.0)

## 2015-10-25 NOTE — Progress Notes (Signed)
   Subjective:    Patient ID: Charles Beltran, male    DOB: [birth year], 65 y.o.   MRN: [id number]  PT presents to the office today for chronic follow up.  Hypertension  This is a chronic problem. The current episode started more than 1 year ago. The problem has been resolved since onset. The problem is controlled. Pertinent negatives include no anxiety, headaches, palpitations or peripheral edema. Risk factors for coronary artery disease include dyslipidemia, male gender and sedentary lifestyle. Past treatments include ACE inhibitors and diuretics. The current treatment provides moderate improvement. There is no history of kidney disease, CAD/MI, CVA, heart failure or a thyroid problem. There is no history of sleep apnea.  Hyperlipidemia  This is a chronic problem. The current episode started more than 1 year ago. The problem is controlled. Recent lipid tests were reviewed and are normal. He has no history of diabetes. Pertinent negatives include no leg pain. Current antihyperlipidemic treatment includes statins. The current treatment provides moderate improvement of lipids. Risk factors for coronary artery disease include dyslipidemia, hypertension and male sex.      Review of Systems  Constitutional: Negative.   HENT: Negative.   Respiratory: Negative.   Cardiovascular: Negative.  Negative for palpitations.  Gastrointestinal: Negative.   Endocrine: Negative.   Genitourinary: Negative.   Musculoskeletal: Negative.   Neurological: Negative.  Negative for headaches.  Hematological: Negative.   Psychiatric/Behavioral: Negative.   All other systems reviewed and are negative.      Objective:   Physical Exam  Constitutional: He is oriented to person, place, and time. He appears well-developed and well-nourished. No distress.  HENT:  Head: Normocephalic.  Right Ear: External ear normal.  Left Ear: External ear normal.  Nose: Nose normal.  Mouth/Throat: Oropharynx is clear and moist.  Eyes:  Pupils are equal, round, and reactive to light. Right eye exhibits no discharge. Left eye exhibits no discharge.  Neck: Normal range of motion. Neck supple. No thyromegaly present.  Cardiovascular: Normal rate, regular rhythm and intact distal pulses.   Murmur heard. Pulmonary/Chest: Effort normal and breath sounds normal. No respiratory distress. He has no wheezes.  Abdominal: Soft. Bowel sounds are normal. He exhibits no distension. There is no tenderness.  Genitourinary: Rectum normal, prostate normal and penis normal. No penile tenderness.  Musculoskeletal: Normal range of motion. He exhibits no edema or tenderness.  Neurological: He is alert and oriented to person, place, and time. He has normal reflexes. No cranial nerve deficit.  Skin: Skin is warm and dry. No rash noted. No erythema.  Psychiatric: He has a normal mood and affect. His behavior is normal. Judgment and thought content normal.  Vitals reviewed.     BP 139/81   Pulse 83   Temp 97.8 F (36.6 C) (Oral)   Ht '5\' 8"'$  (1.727 m)   Wt 176 lb 9.6 oz (80.1 kg)   BMI 26.85 kg/m      Assessment & Plan:  1. Essential hypertension - CMP14+EGFR  2. Hyperlipidemia - CMP14+EGFR  3. Overweight (BMI 25.0-29.9) - CMP14+EGFR  4. Vitamin D deficiency - CMP14+EGFR  5. Colon cancer screening - Fecal occult blood, imunochemical; Future  6. Elevated fasting glucose - Bayer DCA Hb A1c Waived   Continue all meds Labs pending Health Maintenance reviewed Diet and exercise encouraged RTO 6 months  Evelina Dun, FNP

## 2015-10-25 NOTE — Patient Instructions (Signed)

## 2015-10-26 LAB — CMP14+EGFR
ALT: 40 IU/L (ref 0–44)
AST: 32 IU/L (ref 0–40)
Albumin/Globulin Ratio: 1.8 (ref 1.2–2.2)
Albumin: 4.6 g/dL (ref 3.6–4.8)
Alkaline Phosphatase: 60 IU/L (ref 39–117)
BILIRUBIN TOTAL: 0.6 mg/dL (ref 0.0–1.2)
BUN/Creatinine Ratio: 19 (ref 10–24)
BUN: 19 mg/dL (ref 8–27)
CHLORIDE: 96 mmol/L (ref 96–106)
CO2: 22 mmol/L (ref 18–29)
Calcium: 9.5 mg/dL (ref 8.6–10.2)
Creatinine, Ser: 1 mg/dL (ref 0.76–1.27)
GFR calc non Af Amer: 79 mL/min/{1.73_m2} (ref >59)
GFR, EST AFRICAN AMERICAN: 92 mL/min/{1.73_m2} (ref >59)
GLUCOSE: 142 mg/dL — AB (ref 65–99)
Globulin, Total: 2.5 g/dL (ref 1.5–4.5)
Potassium: 3.8 mmol/L (ref 3.5–5.2)
Sodium: 137 mmol/L (ref 134–144)
TOTAL PROTEIN: 7.1 g/dL (ref 6.0–8.5)

## 2015-10-27 IMAGING — CR DG FOOT COMPLETE 3+V*L*
3 series · 3 of 3 positions shown · non-contrast
Comparison: None.

CLINICAL DATA: Hips for.  Pain for several weeks.

EXAM:
LEFT FOOT - COMPLETE 3+ VIEW

[view not recorded (1 of 3)]
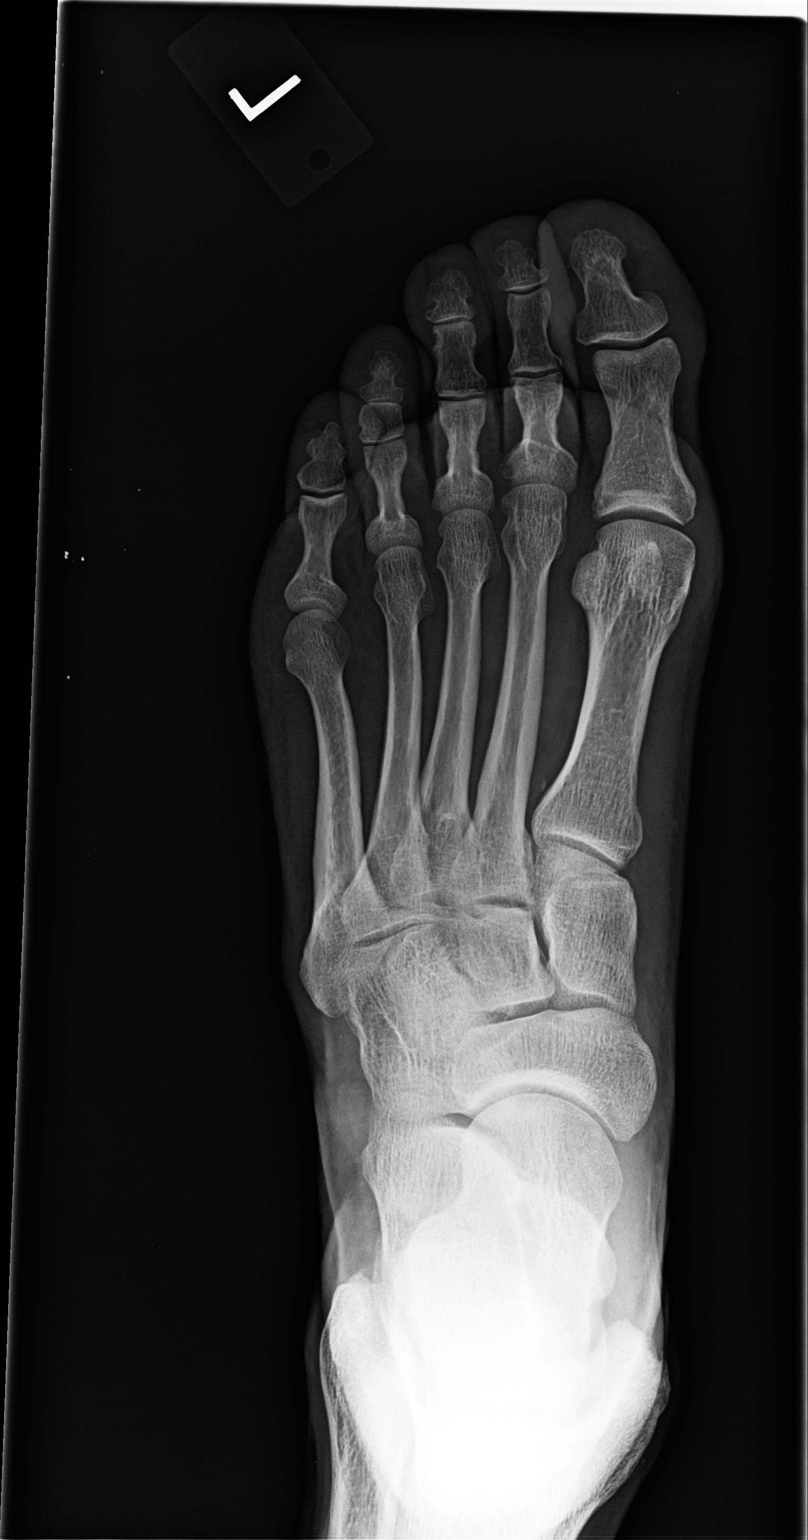

[view not recorded (2 of 3)]
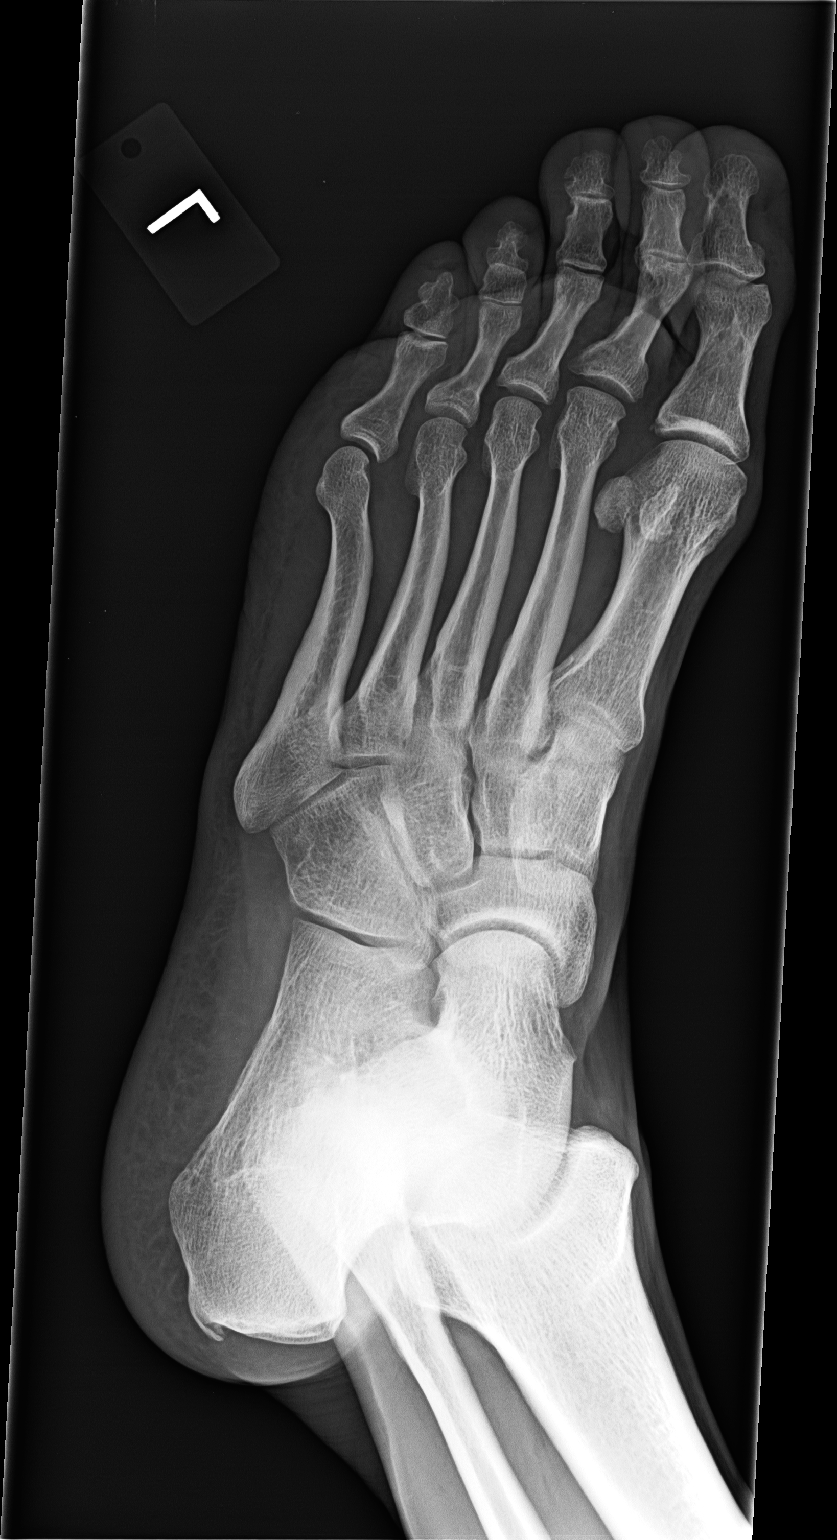

[view not recorded (3 of 3)]
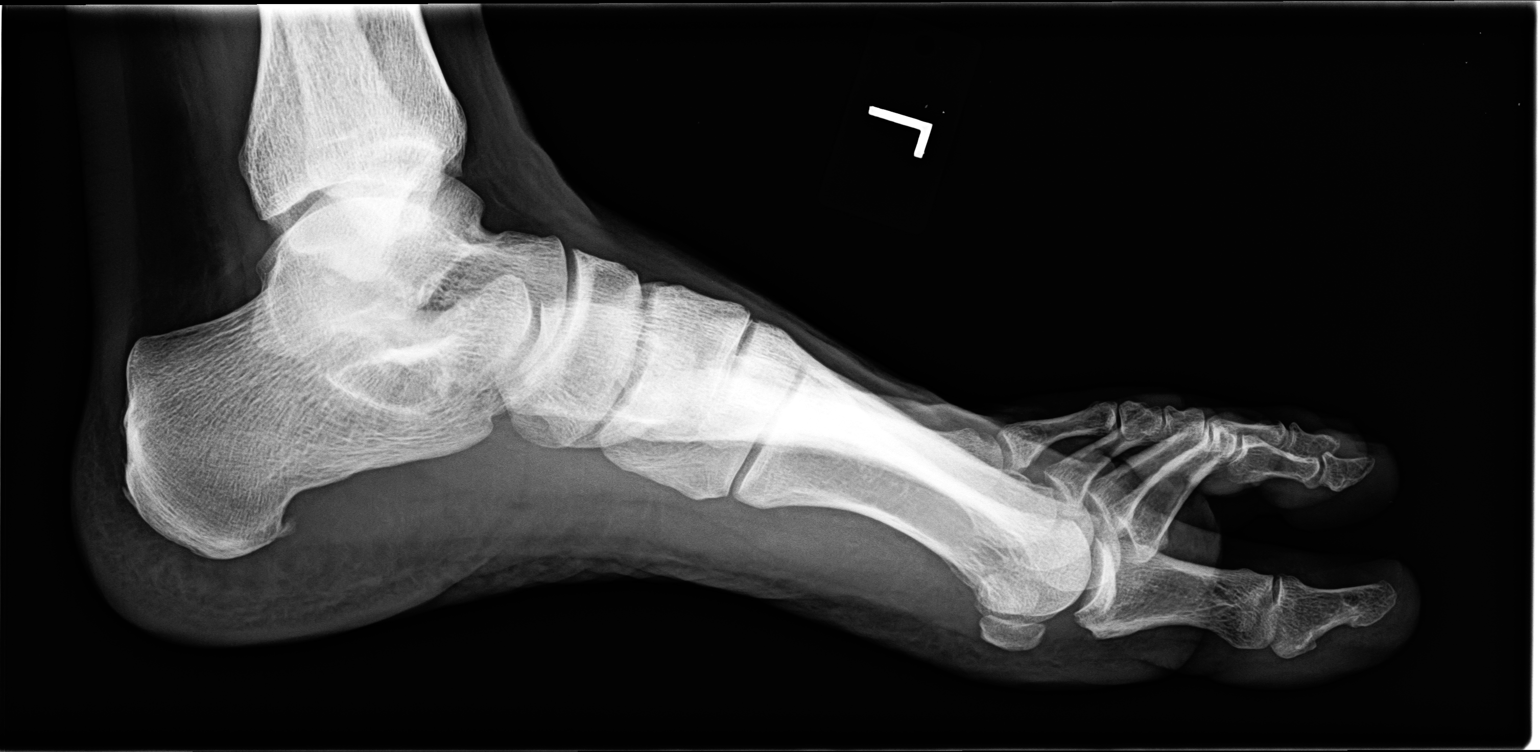

[3 of 3 positions shown; findings below may reference images not displayed]

FINDINGS: No acute soft tissue bony abnormality identified. Mild calcaneal
spurring noted. No radiopaque foreign body.
IMPRESSION: Mild calcaneal spurring, otherwise negative exam.

## 2015-12-16 ENCOUNTER — Other Ambulatory Visit: Payer: Self-pay | Admitting: Family

## 2015-12-16 ENCOUNTER — Telehealth: Payer: Self-pay | Admitting: Family

## 2015-12-16 NOTE — Telephone Encounter (Signed)
Called [facility] back and his lisinopril htcz was filled for 45 days and not 90, rx corrected.

## 2015-12-17 ENCOUNTER — Other Ambulatory Visit: Payer: Self-pay | Admitting: Family

## 2015-12-19 ENCOUNTER — Other Ambulatory Visit: Payer: Self-pay | Admitting: *Deleted

## 2015-12-23 ENCOUNTER — Encounter: Payer: Self-pay | Admitting: Family

## 2015-12-23 ENCOUNTER — Ambulatory Visit (INDEPENDENT_AMBULATORY_CARE_PROVIDER_SITE_OTHER): Payer: Managed Care, Other (non HMO) | Admitting: Family

## 2015-12-23 ENCOUNTER — Encounter (INDEPENDENT_AMBULATORY_CARE_PROVIDER_SITE_OTHER): Payer: Self-pay

## 2015-12-23 VITALS — BP 137/77 | HR 78 | Temp 97.1°F | Ht 68.0 in | Wt 180.8 lb

## 2015-12-23 DIAGNOSIS — R197 Diarrhea, unspecified: Secondary | ICD-10-CM | POA: Diagnosis not present

## 2015-12-23 MED ORDER — DIPHENOXYLATE-ATROPINE 2.5-0.025 MG PO TABS: 2.0000 | ORAL_TABLET | Freq: Four times a day (QID) | ORAL | 0 refills | Status: DC | PRN

## 2015-12-23 NOTE — Patient Instructions (Signed)

## 2015-12-23 NOTE — Progress Notes (Signed)
   Subjective:    Patient ID: Charles Beltran, male    DOB: 01/16/1951, 65 y.o.   MRN: [id number]  Diarrhea   This is a new problem. The current episode started 1 to 4 weeks ago (two weeks ago). The problem occurs 2 to 4 times per day. The problem has been unchanged. The stool consistency is described as watery. The patient states that diarrhea does not awaken him from sleep. Associated symptoms include abdominal pain ("small") and increased flatus. Pertinent negatives include no bloating, chills, coughing, fever, myalgias or vomiting. There are no known risk factors. He has tried anti-motility drug and change of diet for the symptoms. The treatment provided mild relief.      Review of Systems  Constitutional: Negative for chills and fever.  Respiratory: Negative for cough.   Gastrointestinal: Positive for abdominal pain ("small"), diarrhea and flatus. Negative for bloating and vomiting.  Musculoskeletal: Negative for myalgias.  All other systems reviewed and are negative.      Objective:   Physical Exam  Constitutional: He is oriented to person, place, and time. He appears well-developed and well-nourished. No distress.  HENT:  Head: Normocephalic.  Eyes: Pupils are equal, round, and reactive to light. Right eye exhibits no discharge. Left eye exhibits no discharge.  Neck: Normal range of motion. Neck supple. No thyromegaly present.  Cardiovascular: Normal rate, regular rhythm, normal heart sounds and intact distal pulses.   No murmur heard. Pulmonary/Chest: Effort normal and breath sounds normal. No respiratory distress. He has no wheezes.  Abdominal: Soft. Bowel sounds are normal. He exhibits no distension. There is no tenderness.  Normal BS, no tenderness present  Musculoskeletal: Normal range of motion. He exhibits no edema or tenderness.  Neurological: He is alert and oriented to person, place, and time.  Skin: Skin is warm and dry. No rash noted. No erythema.  Psychiatric: He has a  normal mood and affect. His behavior is normal. Judgment and thought content normal.  Vitals reviewed.     BP 137/77   Pulse 78   Temp 97.1 F (36.2 C) (Oral)   Ht 5\' 8"  (1.727 m)   Wt 180 lb 12.8 oz (82 kg)   BMI 27.49 kg/m      Assessment & Plan:  1. Diarrhea, unspecified type -Use Imodium and Lomotil as needed  -Bland diet -Force fluids -RTO if diarrhea worsens or does not improve- May need stool cultures? - diphenoxylate-atropine (LOMOTIL) 2.5-0.025 MG tablet; Take 2 tablets by mouth 4 (four) times daily as needed for diarrhea or loose stools.  Dispense: 45 tablet; Refill: 0  Jannifer Rodneyhristy Maille Halliwell, FNP

## 2015-12-28 ENCOUNTER — Other Ambulatory Visit: Payer: Self-pay | Admitting: Family

## 2015-12-28 DIAGNOSIS — R197 Diarrhea, unspecified: Secondary | ICD-10-CM

## 2016-01-24 ENCOUNTER — Encounter: Payer: Self-pay | Admitting: Family

## 2016-01-24 ENCOUNTER — Ambulatory Visit (INDEPENDENT_AMBULATORY_CARE_PROVIDER_SITE_OTHER): Payer: Managed Care, Other (non HMO) | Admitting: Family

## 2016-01-24 VITALS — BP 137/83 | HR 80 | Temp 97.5°F | Ht 68.0 in | Wt 176.2 lb

## 2016-01-24 DIAGNOSIS — E785 Hyperlipidemia, unspecified: Secondary | ICD-10-CM

## 2016-01-24 DIAGNOSIS — E559 Vitamin D deficiency, unspecified: Secondary | ICD-10-CM

## 2016-01-24 DIAGNOSIS — E663 Overweight: Secondary | ICD-10-CM | POA: Diagnosis not present

## 2016-01-24 DIAGNOSIS — I10 Essential (primary) hypertension: Secondary | ICD-10-CM

## 2016-01-24 NOTE — Progress Notes (Signed)
   Subjective:    Patient ID: Charles Beltran, male    DOB: 07-09-1950, 65 y.o.   MRN: [id number]  PT presents to the office today for chronic follow up.  Hypertension  This is a chronic problem. The current episode started more than 1 year ago. The problem has been resolved since onset. The problem is controlled. Pertinent negatives include no anxiety, headaches, palpitations or peripheral edema. Risk factors for coronary artery disease include dyslipidemia, male gender and sedentary lifestyle. Past treatments include ACE inhibitors and diuretics. The current treatment provides moderate improvement. There is no history of kidney disease, CAD/MI, CVA, heart failure or a thyroid problem. There is no history of sleep apnea.  Hyperlipidemia  This is a chronic problem. The current episode started more than 1 year ago. The problem is controlled. Recent lipid tests were reviewed and are normal. He has no history of diabetes. Pertinent negatives include no leg pain. Current antihyperlipidemic treatment includes statins. The current treatment provides moderate improvement of lipids. Risk factors for coronary artery disease include dyslipidemia, hypertension and male sex.      Review of Systems  Constitutional: Negative.   HENT: Negative.   Respiratory: Negative.   Cardiovascular: Negative.  Negative for palpitations.  Gastrointestinal: Negative.   Endocrine: Negative.   Genitourinary: Negative.   Musculoskeletal: Negative.   Neurological: Negative.  Negative for headaches.  Hematological: Negative.   Psychiatric/Behavioral: Negative.   All other systems reviewed and are negative.      Objective:   Physical Exam  Constitutional: He is oriented to person, place, and time. He appears well-developed and well-nourished. No distress.  HENT:  Head: Normocephalic.  Right Ear: External ear normal.  Left Ear: External ear normal.  Nose: Nose normal.  Mouth/Throat: Oropharynx is clear and moist.  Eyes:  Pupils are equal, round, and reactive to light. Right eye exhibits no discharge. Left eye exhibits no discharge.  Neck: Normal range of motion. Neck supple. No thyromegaly present.  Cardiovascular: Normal rate, regular rhythm and intact distal pulses.   Murmur heard. Pulmonary/Chest: Effort normal and breath sounds normal. No respiratory distress. He has no wheezes.  Abdominal: Soft. Bowel sounds are normal. He exhibits no distension. There is no tenderness.  Genitourinary: Rectum normal, prostate normal and penis normal. No penile tenderness.  Musculoskeletal: Normal range of motion. He exhibits no edema or tenderness.  Neurological: He is alert and oriented to person, place, and time. He has normal reflexes. No cranial nerve deficit.  Skin: Skin is warm and dry. No rash noted. No erythema.  Psychiatric: He has a normal mood and affect. His behavior is normal. Judgment and thought content normal.  Vitals reviewed.     BP 137/83   Pulse 80   Temp 97.5 F (36.4 C) (Oral)   Ht '5\' 8"'$  (1.727 m)   Wt 176 lb 3.2 oz (79.9 kg)   BMI 26.79 kg/m      Assessment & Plan:  1. Essential hypertension - CMP14+EGFR  2. Hyperlipidemia, unspecified hyperlipidemia type - CMP14+EGFR - Lipid panel  3. Overweight (BMI 25.0-29.9) - CMP14+EGFR  4. Vitamin D deficiency - CMP14+EGFR - VITAMIN D 25 Hydroxy (Vit-D Deficiency, Fractures)  Pt requesting Prostate and genital check today. Discussed we could not do that today.   Continue all meds Labs pending Health Maintenance reviewed Diet and exercise encouraged RTO 6 months  Evelina Dun, FNP

## 2016-01-24 NOTE — Patient Instructions (Signed)

## 2016-01-25 LAB — CMP14+EGFR
ALK PHOS: 57 IU/L (ref 39–117)
ALT: 52 IU/L — AB (ref 0–44)
AST: 41 IU/L — AB (ref 0–40)
Albumin/Globulin Ratio: 1.8 (ref 1.2–2.2)
Albumin: 4.6 g/dL (ref 3.6–4.8)
BUN/Creatinine Ratio: 19 (ref 10–24)
BUN: 18 mg/dL (ref 8–27)
Bilirubin Total: 0.5 mg/dL (ref 0.0–1.2)
CO2: 19 mmol/L (ref 18–29)
CREATININE: 0.93 mg/dL (ref 0.76–1.27)
Calcium: 9.5 mg/dL (ref 8.6–10.2)
Chloride: 97 mmol/L (ref 96–106)
GFR calc Af Amer: 100 mL/min/{1.73_m2} (ref >59)
GFR calc non Af Amer: 86 mL/min/{1.73_m2} (ref >59)
GLOBULIN, TOTAL: 2.6 g/dL (ref 1.5–4.5)
Glucose: 148 mg/dL — ABNORMAL HIGH (ref 65–99)
POTASSIUM: 4.9 mmol/L (ref 3.5–5.2)
SODIUM: 139 mmol/L (ref 134–144)
Total Protein: 7.2 g/dL (ref 6.0–8.5)

## 2016-01-25 LAB — VITAMIN D 25 HYDROXY (VIT D DEFICIENCY, FRACTURES): Vit D, 25-Hydroxy: 42.8 ng/mL (ref 30.0–100.0)

## 2016-01-25 LAB — LIPID PANEL
CHOLESTEROL TOTAL: 122 mg/dL (ref 100–199)
Chol/HDL Ratio: 2.6 ratio units (ref 0.0–5.0)
HDL: 47 mg/dL (ref >39)
LDL CALC: 61 mg/dL (ref 0–99)
TRIGLYCERIDES: 68 mg/dL (ref 0–149)
VLDL Cholesterol Cal: 14 mg/dL (ref 5–40)

## 2016-02-14 ENCOUNTER — Encounter: Payer: Self-pay | Admitting: *Deleted

## 2016-06-04 ENCOUNTER — Other Ambulatory Visit: Payer: Self-pay | Admitting: Family

## 2016-06-14 ENCOUNTER — Other Ambulatory Visit: Payer: Self-pay | Admitting: Family

## 2016-07-23 ENCOUNTER — Encounter: Payer: Self-pay | Admitting: Family

## 2016-07-23 ENCOUNTER — Ambulatory Visit (INDEPENDENT_AMBULATORY_CARE_PROVIDER_SITE_OTHER): Payer: PPO | Admitting: Family

## 2016-07-23 VITALS — BP 139/80 | HR 76 | Temp 96.8°F | Ht 68.0 in | Wt 183.0 lb

## 2016-07-23 DIAGNOSIS — E559 Vitamin D deficiency, unspecified: Secondary | ICD-10-CM

## 2016-07-23 DIAGNOSIS — I10 Essential (primary) hypertension: Secondary | ICD-10-CM | POA: Diagnosis not present

## 2016-07-23 DIAGNOSIS — E663 Overweight: Secondary | ICD-10-CM

## 2016-07-23 DIAGNOSIS — Z0000 Encounter for general adult medical examination without abnormal findings: Secondary | ICD-10-CM | POA: Diagnosis not present

## 2016-07-23 DIAGNOSIS — E785 Hyperlipidemia, unspecified: Secondary | ICD-10-CM | POA: Diagnosis not present

## 2016-07-23 DIAGNOSIS — Z1211 Encounter for screening for malignant neoplasm of colon: Secondary | ICD-10-CM

## 2016-07-23 NOTE — Patient Instructions (Signed)

## 2016-07-23 NOTE — Progress Notes (Signed)
Subjective:    Patient ID: Charles Beltran, male    DOB: 10-24-1950, 66 y.o.   MRN: [id number]  Pt presents to the office today for CPE today. Hypertension  This is a chronic problem. The current episode started more than 1 year ago. The problem has been resolved since onset. The problem is controlled. Pertinent negatives include no headaches, malaise/fatigue, peripheral edema or shortness of breath. Risk factors for coronary artery disease include dyslipidemia, obesity and sedentary lifestyle. The current treatment provides moderate improvement. There is no history of kidney disease, CAD/MI, CVA or heart failure.  Hyperlipidemia  This is a chronic problem. The current episode started more than 1 year ago. The problem is controlled. Recent lipid tests were reviewed and are normal. Pertinent negatives include no shortness of breath. Current antihyperlipidemic treatment includes statins. The current treatment provides moderate improvement of lipids. Risk factors for coronary artery disease include dyslipidemia and male sex.      Review of Systems  Constitutional: Negative for malaise/fatigue.  Respiratory: Negative for shortness of breath.   Neurological: Negative for headaches.  All other systems reviewed and are negative.      Objective:   Physical Exam  Constitutional: He is oriented to person, place, and time. He appears well-developed and well-nourished. No distress.  HENT:  Head: Normocephalic.  Right Ear: External ear normal.  Left Ear: External ear normal.  Nose: Nose normal.  Mouth/Throat: Oropharynx is clear and moist.  Left cerumen impaction   Eyes: Pupils are equal, round, and reactive to light. Right eye exhibits no discharge. Left eye exhibits no discharge.  Neck: Normal range of motion. Neck supple. No thyromegaly present.  Cardiovascular: Normal rate, regular rhythm and intact distal pulses.   Murmur heard. Pulmonary/Chest: Effort normal and breath sounds normal. No  respiratory distress. He has no wheezes.  Abdominal: Soft. Bowel sounds are normal. He exhibits no distension. There is no tenderness. Hernia confirmed negative in the right inguinal area and confirmed negative in the left inguinal area.  Genitourinary: Prostate normal and penis normal. Rectal exam shows no external hemorrhoid, no internal hemorrhoid, no fissure, no mass and no tenderness. Prostate is not enlarged. Right testis shows no mass, no swelling and no tenderness. Right testis is descended. Cremasteric reflex is not absent on the right side. Left testis shows no mass, no swelling and no tenderness. Left testis is descended. Cremasteric reflex is not absent on the left side.  Musculoskeletal: Normal range of motion. He exhibits no edema or tenderness.  Neurological: He is alert and oriented to person, place, and time.  Skin: Skin is warm and dry. No rash noted. No erythema.  Psychiatric: He has a normal mood and affect. His behavior is normal. Judgment and thought content normal.  Vitals reviewed.   BP 139/80   Pulse 76   Temp (!) 96.8 F (36 C) (Oral)   Ht _0  (1.727 m)   Wt 183 lb (83 kg)   BMI 27.83 kg/m      Assessment & Plan:  1. Annual physical exam - CMP14+EGFR - Lipid panel - CBC with Differential/Platelet - PSA, total and free - Thyroid Panel With TSH - VITAMIN D 25 Hydroxy (Vit-D Deficiency, Fractures)  2. Essential hypertension - CMP14+EGFR  3. Overweight (BMI 25.0-29.9) - CMP14+EGFR  4. Hyperlipidemia, unspecified hyperlipidemia type - CMP14+EGFR - Lipid panel  5. Vitamin D deficiency - CMP14+EGFR - VITAMIN D 25 Hydroxy (Vit-D Deficiency, Fractures)  6. Colon cancer screening - Fecal occult blood, imunochemical;  Future   Continue all meds Labs pending Health Maintenance reviewed Diet and exercise encouraged RTO 6 months   Evelina Dun, FNP

## 2016-07-24 LAB — CMP14+EGFR
ALBUMIN: 4.7 g/dL (ref 3.6–4.8)
ALK PHOS: 60 IU/L (ref 39–117)
ALT: 37 IU/L (ref 0–44)
AST: 24 IU/L (ref 0–40)
Albumin/Globulin Ratio: 1.7 (ref 1.2–2.2)
BUN/Creatinine Ratio: 21 (ref 10–24)
BUN: 21 mg/dL (ref 8–27)
Bilirubin Total: 0.7 mg/dL (ref 0.0–1.2)
CO2: 22 mmol/L (ref 20–29)
CREATININE: 1 mg/dL (ref 0.76–1.27)
Calcium: 9.7 mg/dL (ref 8.6–10.2)
Chloride: 98 mmol/L (ref 96–106)
GFR calc Af Amer: 91 mL/min/{1.73_m2} (ref >59)
GFR, EST NON AFRICAN AMERICAN: 79 mL/min/{1.73_m2} (ref >59)
GLOBULIN, TOTAL: 2.8 g/dL (ref 1.5–4.5)
GLUCOSE: 126 mg/dL — AB (ref 65–99)
Potassium: 4.6 mmol/L (ref 3.5–5.2)
SODIUM: 137 mmol/L (ref 134–144)
Total Protein: 7.5 g/dL (ref 6.0–8.5)

## 2016-07-24 LAB — THYROID PANEL WITH TSH
FREE THYROXINE INDEX: 2.2 (ref 1.2–4.9)
T3 UPTAKE RATIO: 26 % (ref 24–39)
T4, Total: 8.4 ug/dL (ref 4.5–12.0)
TSH: 2.1 u[IU]/mL (ref 0.450–4.500)

## 2016-07-24 LAB — LIPID PANEL
CHOL/HDL RATIO: 2.3 ratio (ref 0.0–5.0)
CHOLESTEROL TOTAL: 129 mg/dL (ref 100–199)
HDL: 55 mg/dL (ref >39)
LDL CALC: 60 mg/dL (ref 0–99)
TRIGLYCERIDES: 68 mg/dL (ref 0–149)
VLDL CHOLESTEROL CAL: 14 mg/dL (ref 5–40)

## 2016-07-24 LAB — CBC WITH DIFFERENTIAL/PLATELET
BASOS ABS: 0 10*3/uL (ref 0.0–0.2)
BASOS: 0 %
EOS (ABSOLUTE): 0.1 10*3/uL (ref 0.0–0.4)
Eos: 1 %
Hematocrit: 42.9 % (ref 37.5–51.0)
Hemoglobin: 15.1 g/dL (ref 13.0–17.7)
Immature Grans (Abs): 0 10*3/uL (ref 0.0–0.1)
Immature Granulocytes: 0 %
LYMPHS ABS: 0.7 10*3/uL (ref 0.7–3.1)
Lymphs: 7 %
MCH: 30.6 pg (ref 26.6–33.0)
MCHC: 35.2 g/dL (ref 31.5–35.7)
MCV: 87 fL (ref 79–97)
MONOS ABS: 1 10*3/uL — AB (ref 0.1–0.9)
Monocytes: 11 %
NEUTROS ABS: 7.5 10*3/uL — AB (ref 1.4–7.0)
Neutrophils: 81 %
PLATELETS: 179 10*3/uL (ref 150–379)
RBC: 4.93 x10E6/uL (ref 4.14–5.80)
RDW: 14.4 % (ref 12.3–15.4)
WBC: 9.2 10*3/uL (ref 3.4–10.8)

## 2016-07-24 LAB — PSA, TOTAL AND FREE
PROSTATE SPECIFIC AG, SERUM: 1.1 ng/mL (ref 0.0–4.0)
PSA, Free Pct: 33.6 %
PSA, Free: 0.37 ng/mL

## 2016-07-24 LAB — VITAMIN D 25 HYDROXY (VIT D DEFICIENCY, FRACTURES): VIT D 25 HYDROXY: 39.7 ng/mL (ref 30.0–100.0)

## 2016-07-26 ENCOUNTER — Other Ambulatory Visit: Payer: Self-pay | Admitting: Family

## 2016-07-26 DIAGNOSIS — E119 Type 2 diabetes mellitus without complications: Secondary | ICD-10-CM | POA: Insufficient documentation

## 2016-07-26 DIAGNOSIS — R7303 Prediabetes: Secondary | ICD-10-CM | POA: Insufficient documentation

## 2016-07-26 LAB — SPECIMEN STATUS REPORT

## 2016-07-26 LAB — HGB A1C W/O EAG: HEMOGLOBIN A1C: 5.8 % — AB (ref 4.8–5.6)

## 2016-07-31 ENCOUNTER — Encounter: Payer: Self-pay | Admitting: *Deleted

## 2016-08-28 ENCOUNTER — Encounter: Payer: Self-pay | Admitting: Family

## 2016-08-28 ENCOUNTER — Ambulatory Visit (INDEPENDENT_AMBULATORY_CARE_PROVIDER_SITE_OTHER): Payer: PPO | Admitting: Family

## 2016-08-28 VITALS — BP 139/79 | HR 86 | Temp 97.9°F | Ht 68.0 in | Wt 187.0 lb

## 2016-08-28 DIAGNOSIS — B349 Viral infection, unspecified: Secondary | ICD-10-CM

## 2016-08-28 DIAGNOSIS — R197 Diarrhea, unspecified: Secondary | ICD-10-CM | POA: Diagnosis not present

## 2016-08-28 MED ORDER — DIPHENOXYLATE-ATROPINE 2.5-0.025 MG PO TABS: 2.0000 | ORAL_TABLET | Freq: Four times a day (QID) | ORAL | 1 refills | Status: DC | PRN

## 2016-08-28 NOTE — Patient Instructions (Signed)

## 2016-08-28 NOTE — Progress Notes (Signed)
   Subjective:    Patient ID: Charles Beltran, male    DOB: 02/26/1950, 66 y.o.   MRN: [id number]  Diarrhea   This is a new problem. The current episode started in the past 7 days. The problem occurs 2 to 4 times per day. The problem has been unchanged. The patient states that diarrhea awakens him from sleep. Associated symptoms include abdominal pain, bloating and increased flatus. Pertinent negatives include no chills, coughing, fever or vomiting. Nothing aggravates the symptoms. There are no known risk factors. He has tried anti-motility drug for the symptoms. The treatment provided mild relief.      Review of Systems  Constitutional: Negative for chills and fever.  Respiratory: Negative for cough.   Gastrointestinal: Positive for abdominal pain, bloating, diarrhea and flatus. Negative for vomiting.  All other systems reviewed and are negative.      Objective:   Physical Exam  Constitutional: He is oriented to person, place, and time. He appears well-developed and well-nourished. No distress.  HENT:  Head: Normocephalic.  Neck: Normal range of motion. Neck supple. No thyromegaly present.  Cardiovascular: Normal rate, regular rhythm, normal heart sounds and intact distal pulses.   No murmur heard. Pulmonary/Chest: Effort normal and breath sounds normal. No respiratory distress. He has no wheezes.  Abdominal: Soft. Bowel sounds are normal. He exhibits no distension. There is no tenderness.  Musculoskeletal: Normal range of motion. He exhibits no edema or tenderness.  Neurological: He is alert and oriented to person, place, and time.  Skin: Skin is warm and dry. No rash noted. No erythema.  Psychiatric: He has a normal mood and affect. His behavior is normal. Judgment and thought content normal.  Vitals reviewed.    BP 139/79   Pulse 86   Temp 97.9 F (36.6 C) (Oral)   Ht 5\' 8"  (1.727 m)   Wt 187 lb (84.8 kg)   BMI 28.43 kg/m       Assessment & Plan:  1. Diarrhea,  unspecified type .Force fluids Bland diet Lomotil as needed RTO prn or if symptoms worsen or do not improve - diphenoxylate-atropine (LOMOTIL) 2.5-0.025 MG tablet; Take 2 tablets by mouth 4 (four) times daily as needed for diarrhea or loose stools.  Dispense: 30 tablet; Refill: 1  2. Viral illness - diphenoxylate-atropine (LOMOTIL) 2.5-0.025 MG tablet; Take 2 tablets by mouth 4 (four) times daily as needed for diarrhea or loose stools.  Dispense: 30 tablet; Refill: 1    Jannifer Rodneyhristy Murray Guzzetta, FNP

## 2016-09-27 ENCOUNTER — Ambulatory Visit (INDEPENDENT_AMBULATORY_CARE_PROVIDER_SITE_OTHER): Payer: PPO | Admitting: Family

## 2016-09-27 ENCOUNTER — Encounter: Payer: Self-pay | Admitting: Family

## 2016-09-27 VITALS — BP 136/71 | HR 88 | Temp 98.0°F | Ht 68.0 in | Wt 187.6 lb

## 2016-09-27 DIAGNOSIS — K591 Functional diarrhea: Secondary | ICD-10-CM | POA: Diagnosis not present

## 2016-09-27 DIAGNOSIS — H109 Unspecified conjunctivitis: Secondary | ICD-10-CM

## 2016-09-27 MED ORDER — POLYMYXIN B-TRIMETHOPRIM 10000-0.1 UNIT/ML-% OP SOLN: 1.0000 [drp] | OPHTHALMIC | 0 refills | Status: DC

## 2016-09-27 NOTE — Progress Notes (Signed)
   Subjective:    Patient ID: Charles Beltran, male    DOB: 1951/01/12, 66 y.o.   MRN: [id number]  Diarrhea   This is a recurrent problem. The current episode started more than 1 month ago. The problem occurs less than 2 times per day. The problem has been waxing and waning. The stool consistency is described as watery. Pertinent negatives include no bloating, chills, fever or headaches.  Conjunctivitis   The current episode started yesterday. The onset was sudden. The problem occurs frequently. The problem has been unchanged. The problem is moderate. Nothing relieves the symptoms. Nothing aggravates the symptoms. Associated symptoms include diarrhea, ear discharge, eye discharge and eye redness. Pertinent negatives include no fever, no eye itching, no photophobia, no headaches, no hearing loss, no rhinorrhea, no sore throat and no eye pain.      Review of Systems  Constitutional: Negative for chills and fever.  HENT: Positive for ear discharge. Negative for hearing loss, rhinorrhea and sore throat.   Eyes: Positive for discharge and redness. Negative for photophobia, pain and itching.  Gastrointestinal: Positive for diarrhea. Negative for bloating.  Neurological: Negative for headaches.  All other systems reviewed and are negative.      Objective:   Physical Exam  Constitutional: He is oriented to person, place, and time. He appears well-developed and well-nourished. No distress.  HENT:  Head: Normocephalic.  Right Ear: External ear normal.  Left Ear: External ear normal.  Mouth/Throat: Oropharynx is clear and moist.  Eyes: Pupils are equal, round, and reactive to light. Right eye exhibits no discharge. Left eye exhibits discharge and exudate. Left conjunctiva is injected.  Neck: Normal range of motion. Neck supple. No thyromegaly present.  Cardiovascular: Normal rate, regular rhythm, normal heart sounds and intact distal pulses.   No murmur heard. Pulmonary/Chest: Effort normal and  breath sounds normal. No respiratory distress. He has no wheezes.  Abdominal: Soft. Bowel sounds are normal. He exhibits no distension. There is no tenderness.  Musculoskeletal: Normal range of motion. He exhibits no edema or tenderness.  Neurological: He is alert and oriented to person, place, and time.  Skin: Skin is warm and dry. No rash noted. No erythema.  Psychiatric: He has a normal mood and affect. His behavior is normal. Judgment and thought content normal.  Vitals reviewed.     BP 136/71   Pulse 88   Temp 98 F (36.7 C) (Oral)   Ht 5\' 8"  (1.727 m)   Wt 187 lb 9.6 oz (85.1 kg)   BMI 28.52 kg/m      Assessment & Plan:  1. Functional diarrhea -PT start daily probiotic and daily fiber   2. Bacterial conjunctivitis of left eye Do not rub eye  Good hand hygiene discussed Cool compresses RTO prn  - trimethoprim-polymyxin b (POLYTRIM) ophthalmic solution; Place 1 drop into the left eye every 4 (four) hours.  Dispense: 10 mL; Refill: 0   Jannifer Rodney, FNP

## 2016-09-27 NOTE — Patient Instructions (Signed)

## 2016-12-04 ENCOUNTER — Other Ambulatory Visit: Payer: Self-pay | Admitting: Family

## 2017-01-24 ENCOUNTER — Ambulatory Visit: Payer: PPO | Admitting: Family

## 2017-01-24 ENCOUNTER — Encounter: Payer: Self-pay | Admitting: Family

## 2017-01-24 VITALS — BP 127/68 | HR 72 | Temp 97.2°F | Ht 68.0 in | Wt 188.0 lb

## 2017-01-24 DIAGNOSIS — E785 Hyperlipidemia, unspecified: Secondary | ICD-10-CM

## 2017-01-24 DIAGNOSIS — E663 Overweight: Secondary | ICD-10-CM | POA: Diagnosis not present

## 2017-01-24 DIAGNOSIS — I10 Essential (primary) hypertension: Secondary | ICD-10-CM

## 2017-01-24 DIAGNOSIS — K219 Gastro-esophageal reflux disease without esophagitis: Secondary | ICD-10-CM | POA: Diagnosis not present

## 2017-01-24 DIAGNOSIS — Z23 Encounter for immunization: Secondary | ICD-10-CM | POA: Diagnosis not present

## 2017-01-24 DIAGNOSIS — E559 Vitamin D deficiency, unspecified: Secondary | ICD-10-CM

## 2017-01-24 MED ORDER — OMEPRAZOLE 20 MG PO CPDR: 20.0000 mg | DELAYED_RELEASE_CAPSULE | Freq: Every day | ORAL | 3 refills | Status: DC

## 2017-01-24 NOTE — Patient Instructions (Signed)

## 2017-01-24 NOTE — Progress Notes (Signed)
Subjective:    Patient ID: Charles Beltran, male    DOB: [birth year], 66 y.o.   MRN: [id number]  Pt presents to the office today for chronic follow up. Pt states over the last few months patient has had vomiting after he starts eating. Denies any nausea, heartburn,abdominal pain or choking.  Hypertension  This is a chronic problem. The current episode started more than 1 year ago. The problem has been resolved since onset. The problem is controlled. Pertinent negatives include no headaches, malaise/fatigue, peripheral edema or shortness of breath. Risk factors for coronary artery disease include dyslipidemia, sedentary lifestyle and male gender. The current treatment provides moderate improvement. There is no history of kidney disease, CAD/MI, CVA or heart failure.  Hyperlipidemia  This is a chronic problem. The current episode started more than 1 year ago. The problem is controlled. Recent lipid tests were reviewed and are normal. Exacerbating diseases include obesity. Pertinent negatives include no shortness of breath. Current antihyperlipidemic treatment includes statins. The current treatment provides moderate improvement of lipids. Risk factors for coronary artery disease include dyslipidemia, male sex, hypertension and a sedentary lifestyle.      Review of Systems  Constitutional: Negative for malaise/fatigue.  Respiratory: Negative for shortness of breath.   Neurological: Negative for headaches.  All other systems reviewed and are negative.      Objective:   Physical Exam  Constitutional: He is oriented to person, place, and time. He appears well-developed and well-nourished. No distress.  HENT:  Head: Normocephalic.  Right Ear: External ear normal.  Left Ear: External ear normal.  Mouth/Throat: Oropharynx is clear and moist.  Eyes: Pupils are equal, round, and reactive to light. Right eye exhibits no discharge. Left eye exhibits no discharge.  Neck: Normal range of motion. Neck  supple. No thyromegaly present.  Cardiovascular: Normal rate, regular rhythm, normal heart sounds and intact distal pulses.  No murmur heard. Pulmonary/Chest: Effort normal and breath sounds normal. No respiratory distress. He has no wheezes.  Abdominal: Soft. Bowel sounds are normal. He exhibits no distension. There is no tenderness.  Musculoskeletal: Normal range of motion. He exhibits no edema or tenderness.  Neurological: He is alert and oriented to person, place, and time.  Skin: Skin is warm and dry. No rash noted. No erythema.  Psychiatric: He has a normal mood and affect. His behavior is normal. Judgment and thought content normal.  Vitals reviewed.     BP 127/68   Pulse 72   Temp (!) 97.2 F (36.2 C) (Oral)   Ht '5\' 8"'$  (1.727 m)   Wt 188 lb (85.3 kg)   BMI 28.59 kg/m      Assessment & Plan:  1. Essential hypertension - CMP14+EGFR  2. Hyperlipidemia, unspecified hyperlipidemia type - CMP14+EGFR - Lipid panel  3. Overweight (BMI 25.0-29.9) - CMP14+EGFR  4. Vitamin D deficiency - CMP14+EGFR - VITAMIN D 25 Hydroxy (Vit-D Deficiency, Fractures)  5. Gastroesophageal reflux disease, esophagitis presence not specified -Pt started on Prilosec 20 mg today Pt does not want to see GI, will try this first and if not improved may need referral  -Diet discussed- Avoid fried, spicy, citrus foods, caffeine and alcohol -Do not eat 2-3 hours before bedtime -Encouraged small frequent meals -Avoid NSAID's - CMP14+EGFR - omeprazole (PRILOSEC) 20 MG capsule; Take 1 capsule (20 mg total) by mouth daily.  Dispense: 90 capsule; Refill: 3   Continue all meds Labs pending Health Maintenance reviewed Diet and exercise encouraged RTO 6 months   [facility]  Lenna Gilford, [place]

## 2017-01-24 NOTE — Addendum Note (Signed)
Addended by: Almeta MonasSTONE, JANIE M on: 01/24/2017 10:27 AM   Modules accepted: Orders

## 2017-01-25 LAB — CMP14+EGFR
ALBUMIN: 4.7 g/dL (ref 3.6–4.8)
ALK PHOS: 59 IU/L (ref 39–117)
ALT: 39 IU/L (ref 0–44)
AST: 28 IU/L (ref 0–40)
Albumin/Globulin Ratio: 1.7 (ref 1.2–2.2)
BILIRUBIN TOTAL: 0.8 mg/dL (ref 0.0–1.2)
BUN / CREAT RATIO: 18 (ref 10–24)
BUN: 20 mg/dL (ref 8–27)
CO2: 22 mmol/L (ref 20–29)
CREATININE: 1.11 mg/dL (ref 0.76–1.27)
Calcium: 9.8 mg/dL (ref 8.6–10.2)
Chloride: 100 mmol/L (ref 96–106)
GFR calc non Af Amer: 69 mL/min/{1.73_m2} (ref >59)
GFR, EST AFRICAN AMERICAN: 80 mL/min/{1.73_m2} (ref >59)
GLOBULIN, TOTAL: 2.7 g/dL (ref 1.5–4.5)
GLUCOSE: 136 mg/dL — AB (ref 65–99)
Potassium: 4.3 mmol/L (ref 3.5–5.2)
SODIUM: 137 mmol/L (ref 134–144)
TOTAL PROTEIN: 7.4 g/dL (ref 6.0–8.5)

## 2017-01-25 LAB — LIPID PANEL
CHOL/HDL RATIO: 2.4 ratio (ref 0.0–5.0)
CHOLESTEROL TOTAL: 116 mg/dL (ref 100–199)
HDL: 49 mg/dL (ref >39)
LDL CALC: 49 mg/dL (ref 0–99)
Triglycerides: 89 mg/dL (ref 0–149)
VLDL CHOLESTEROL CAL: 18 mg/dL (ref 5–40)

## 2017-01-25 LAB — VITAMIN D 25 HYDROXY (VIT D DEFICIENCY, FRACTURES): VIT D 25 HYDROXY: 45.6 ng/mL (ref 30.0–100.0)

## 2017-02-26 ENCOUNTER — Other Ambulatory Visit: Payer: Self-pay | Admitting: *Deleted

## 2017-02-26 MED ORDER — LISINOPRIL-HYDROCHLOROTHIAZIDE 20-12.5 MG PO TABS: 2.0000 | ORAL_TABLET | Freq: Every day | ORAL | 1 refills | Status: DC

## 2017-02-26 MED ORDER — ATORVASTATIN CALCIUM 40 MG PO TABS: ORAL_TABLET | ORAL | 1 refills | Status: DC

## 2017-02-26 NOTE — Addendum Note (Signed)
Addended by: Julious PayerHOLT, Miriah Maruyama D on: 02/26/2017 08:48 AM   Modules accepted: Orders

## 2017-05-16 ENCOUNTER — Encounter: Payer: Self-pay | Admitting: *Deleted

## 2017-07-22 ENCOUNTER — Ambulatory Visit (INDEPENDENT_AMBULATORY_CARE_PROVIDER_SITE_OTHER): Payer: PPO | Admitting: Family

## 2017-07-22 ENCOUNTER — Ambulatory Visit (INDEPENDENT_AMBULATORY_CARE_PROVIDER_SITE_OTHER): Payer: PPO

## 2017-07-22 ENCOUNTER — Encounter: Payer: Self-pay | Admitting: Family

## 2017-07-22 VITALS — BP 133/77 | HR 84 | Temp 97.9°F | Ht 68.0 in | Wt 189.8 lb

## 2017-07-22 DIAGNOSIS — M25511 Pain in right shoulder: Secondary | ICD-10-CM

## 2017-07-22 MED ORDER — NAPROXEN 500 MG PO TABS: 500.0000 mg | ORAL_TABLET | Freq: Two times a day (BID) | ORAL | 0 refills | Status: DC

## 2017-07-22 MED ORDER — PREDNISONE 10 MG (21) PO TBPK: ORAL_TABLET | ORAL | 0 refills | Status: DC

## 2017-07-22 NOTE — Addendum Note (Signed)
Addended by: Jannifer RodneyHAWKS, Xitlalic Maslin A on: 07/22/2017 11:32 AM   Modules accepted: Orders

## 2017-07-22 NOTE — Progress Notes (Addendum)
   Subjective:    Patient ID: Charles Beltran, male    DOB: 07/06/1950, 67 y.o.   MRN: [id number]  Chief Complaint  Patient presents with  . right arm and shoulder pain    side effect from omeprazole?    HPI Pt presents to the office today with right arm and shoulder pain that started in March. He stated he had started taking omeprazole for three months prior to this arm pain and thought this caused this pain and stopped it. However, states his arm pain has continued.  He reports aching, intermittent pain of 5 out 10. States extension of his arm makes the pain worse. Denies any trauma or heavy lifting.    Review of Systems  All other systems reviewed and are negative.      Objective:   Physical Exam  Constitutional: He is oriented to person, place, and time. He appears well-developed and well-nourished. No distress.  HENT:  Head: Normocephalic.  Right Ear: External ear normal.  Left Ear: External ear normal.  Mouth/Throat: Oropharynx is clear and moist.  Eyes: Pupils are equal, round, and reactive to light. Right eye exhibits no discharge. Left eye exhibits no discharge.  Neck: Normal range of motion. Neck supple. No thyromegaly present.  Cardiovascular: Normal rate, regular rhythm, normal heart sounds and intact distal pulses.  No murmur heard. Pulmonary/Chest: Effort normal and breath sounds normal. No respiratory distress. He has no wheezes.  Abdominal: Soft. Bowel sounds are normal. He exhibits no distension. There is no tenderness.  Musculoskeletal: Normal range of motion. He exhibits no edema or tenderness.  Full ROM of shoulder, pain in right upper arm with extension  Neurological: He is alert and oriented to person, place, and time. He has normal reflexes. No cranial nerve deficit.  Skin: Skin is warm and dry. No rash noted. No erythema.  Psychiatric: He has a normal mood and affect. His behavior is normal. Judgment and thought content normal.  Vitals reviewed.    BP  133/77   Pulse 84   Temp 97.9 F (36.6 C) (Oral)   Ht 5\' 8"  (1.727 m)   Wt 189 lb 12.8 oz (86.1 kg)   BMI 28.86 kg/m       Assessment & Plan:  [name] was seen today for right arm and shoulder pain.  Diagnoses and all orders for this visit:  Acute pain of right shoulder -     predniSONE (STERAPRED UNI-PAK 21 TAB) 10 MG (21) TBPK tablet; Use as directed -     naproxen (NAPROSYN) 500 MG tablet; Take 1 tablet (500 mg total) by mouth 2 (two) times daily with a meal. -     DG Shoulder Right; Future   Rest Ice  ROM exercises encouraged Start naprosyn BID with food, no other NSAID's RTO as needed if symptoms worsen or do not improve and keep chronic follow up   Jannifer Rodneyhristy Mikeyla Music, FNP

## 2017-07-22 NOTE — Patient Instructions (Signed)
Shoulder Pain Many things can cause shoulder pain, including:  An injury to the area.  Overuse of the shoulder.  Arthritis.  The source of the pain can be:  Inflammation.  An injury to the shoulder joint.  An injury to a tendon, ligament, or bone.  Follow these instructions at home: Take these actions to help with your pain:  Squeeze a soft ball or a foam pad as much as possible. This helps to keep the shoulder from swelling. It also helps to strengthen the arm.  Take over-the-counter and prescription medicines only as told by your health care provider.  If directed, apply ice to the area: ? Put ice in a plastic bag. ? Place a towel between your skin and the bag. ? Leave the ice on for 20 minutes, 2-3 times per day. Stop applying ice if it does not help with the pain.  If you were given a shoulder sling or immobilizer: ? Wear it as told. ? Remove it to shower or bathe. ? Move your arm as little as possible, but keep your hand moving to prevent swelling.  Contact a health care provider if:  Your pain gets worse.  Your pain is not relieved with medicines.  New pain develops in your arm, hand, or fingers. Get help right away if:  Your arm, hand, or fingers: ? Tingle. ? Become numb. ? Become swollen. ? Become painful. ? Turn white or blue. This information is not intended to replace advice given to you by your health care provider. Make sure you discuss any questions you have with your health care provider. Document Released: 11/01/2004 Document Revised: 09/18/2015 Document Reviewed: 05/17/2014 Elsevier Interactive Patient Education  2018 [employer]  

## 2017-07-26 ENCOUNTER — Ambulatory Visit (INDEPENDENT_AMBULATORY_CARE_PROVIDER_SITE_OTHER): Payer: PPO | Admitting: Family

## 2017-07-26 ENCOUNTER — Encounter: Payer: Self-pay | Admitting: Family

## 2017-07-26 VITALS — BP 137/76 | HR 102 | Temp 97.4°F | Ht 68.0 in | Wt 191.6 lb

## 2017-07-26 DIAGNOSIS — Z0000 Encounter for general adult medical examination without abnormal findings: Secondary | ICD-10-CM

## 2017-07-26 DIAGNOSIS — E663 Overweight: Secondary | ICD-10-CM | POA: Diagnosis not present

## 2017-07-26 DIAGNOSIS — I10 Essential (primary) hypertension: Secondary | ICD-10-CM | POA: Diagnosis not present

## 2017-07-26 DIAGNOSIS — E559 Vitamin D deficiency, unspecified: Secondary | ICD-10-CM

## 2017-07-26 DIAGNOSIS — E785 Hyperlipidemia, unspecified: Secondary | ICD-10-CM | POA: Diagnosis not present

## 2017-07-26 DIAGNOSIS — K219 Gastro-esophageal reflux disease without esophagitis: Secondary | ICD-10-CM | POA: Insufficient documentation

## 2017-07-26 NOTE — Patient Instructions (Signed)

## 2017-07-26 NOTE — Progress Notes (Signed)
   Subjective:    Patient ID: Charles Beltran, male    DOB: [birth year], 67 y.o.   MRN: [id number]  Chief Complaint  Patient presents with  . Annual Exam    Hypertension  This is a chronic problem. The current episode started more than 1 year ago. The problem has been waxing and waning since onset. The problem is controlled. Pertinent negatives include no malaise/fatigue, peripheral edema or shortness of breath. Risk factors for coronary artery disease include obesity and male gender. The current treatment provides moderate improvement. There is no history of kidney disease, CVA or heart failure.  Hyperlipidemia  This is a chronic problem. The current episode started more than 1 year ago. The problem is controlled. Recent lipid tests were reviewed and are normal. Exacerbating diseases include obesity. Pertinent negatives include no shortness of breath. Current antihyperlipidemic treatment includes statins. The current treatment provides moderate improvement of lipids. Risk factors for coronary artery disease include dyslipidemia, male sex, hypertension and a sedentary lifestyle.      Review of Systems  Constitutional: Negative for malaise/fatigue.  Respiratory: Negative for shortness of breath.   All other systems reviewed and are negative.      Objective:   Physical Exam  Constitutional: He is oriented to person, place, and time. He appears well-developed and well-nourished. No distress.  HENT:  Head: Normocephalic.  Right Ear: External ear normal.  Left Ear: External ear normal.  Mouth/Throat: Oropharynx is clear and moist.  Left ear cerumen impaction    Eyes: Pupils are equal, round, and reactive to light. Right eye exhibits no discharge. Left eye exhibits no discharge.  Neck: Normal range of motion. Neck supple. No thyromegaly present.  Cardiovascular: Normal rate, regular rhythm and intact distal pulses.  Murmur heard. Pulmonary/Chest: Effort normal and breath sounds normal. No  respiratory distress. He has no wheezes.  Abdominal: Soft. Bowel sounds are normal. He exhibits no distension. There is no tenderness.  Musculoskeletal: Normal range of motion. He exhibits no edema or tenderness.  Neurological: He is alert and oriented to person, place, and time. He has normal reflexes. No cranial nerve deficit.  Skin: Skin is warm and dry. No rash noted. No erythema.  Psychiatric: He has a normal mood and affect. His behavior is normal. Judgment and thought content normal.  Vitals reviewed.     BP 137/76   Pulse (!) 102   Temp (!) 97.4 F (36.3 C) (Oral)   Ht '5\' 8"'$  (1.727 m)   Wt 191 lb 9.6 oz (86.9 kg)   BMI 29.13 kg/m      Assessment & Plan:  Charles Beltran comes in today with chief complaint of Annual Exam   Diagnosis and orders addressed:  1. Annual physical exam - CMP14+EGFR - CBC with Differential/Platelet - Lipid panel - PSA, total and free - TSH - VITAMIN D 25 Hydroxy (Vit-D Deficiency, Fractures)  2. Essential hypertension  - CMP14+EGFR - CBC with Differential/Platelet  3. Hyperlipidemia, unspecified hyperlipidemia type - CMP14+EGFR - CBC with Differential/Platelet - Lipid panel  4. Overweight (BMI 25.0-29.9) - CMP14+EGFR - CBC with Differential/Platelet  5. Vitamin D deficiency  - CMP14+EGFR - CBC with Differential/Platelet - VITAMIN D 25 Hydroxy (Vit-D Deficiency, Fractures)  6. Gastroesophageal reflux disease, esophagitis presence not specified    Labs pending Health Maintenance reviewed Diet and exercise encouraged  Follow up plan: 6 months    Evelina Dun, FNP

## 2017-07-27 LAB — CMP14+EGFR
A/G RATIO: 1.7 (ref 1.2–2.2)
ALBUMIN: 4.4 g/dL (ref 3.6–4.8)
ALK PHOS: 54 IU/L (ref 39–117)
ALT: 30 IU/L (ref 0–44)
AST: 16 IU/L (ref 0–40)
BILIRUBIN TOTAL: 0.6 mg/dL (ref 0.0–1.2)
BUN / CREAT RATIO: 24 (ref 10–24)
BUN: 25 mg/dL (ref 8–27)
CO2: 23 mmol/L (ref 20–29)
Calcium: 9.4 mg/dL (ref 8.6–10.2)
Chloride: 98 mmol/L (ref 96–106)
Creatinine, Ser: 1.04 mg/dL (ref 0.76–1.27)
GFR calc Af Amer: 86 mL/min/{1.73_m2} (ref >59)
GFR calc non Af Amer: 74 mL/min/{1.73_m2} (ref >59)
Globulin, Total: 2.6 g/dL (ref 1.5–4.5)
Glucose: 96 mg/dL (ref 65–99)
POTASSIUM: 4.8 mmol/L (ref 3.5–5.2)
SODIUM: 136 mmol/L (ref 134–144)
Total Protein: 7 g/dL (ref 6.0–8.5)

## 2017-07-27 LAB — LIPID PANEL
CHOLESTEROL TOTAL: 132 mg/dL (ref 100–199)
Chol/HDL Ratio: 1.9 ratio (ref 0.0–5.0)
HDL: 68 mg/dL (ref >39)
LDL Calculated: 52 mg/dL (ref 0–99)
Triglycerides: 62 mg/dL (ref 0–149)
VLDL CHOLESTEROL CAL: 12 mg/dL (ref 5–40)

## 2017-07-27 LAB — CBC WITH DIFFERENTIAL/PLATELET
Basophils Absolute: 0 10*3/uL (ref 0.0–0.2)
Basos: 0 %
EOS (ABSOLUTE): 0 10*3/uL (ref 0.0–0.4)
EOS: 0 %
HEMOGLOBIN: 14.2 g/dL (ref 13.0–17.7)
Hematocrit: 42.2 % (ref 37.5–51.0)
Immature Grans (Abs): 0 10*3/uL (ref 0.0–0.1)
Immature Granulocytes: 0 %
LYMPHS ABS: 2.1 10*3/uL (ref 0.7–3.1)
Lymphs: 20 %
MCH: 30.2 pg (ref 26.6–33.0)
MCHC: 33.6 g/dL (ref 31.5–35.7)
MCV: 90 fL (ref 79–97)
MONOS ABS: 0.2 10*3/uL (ref 0.1–0.9)
Monocytes: 2 %
Neutrophils Absolute: 8.4 10*3/uL — ABNORMAL HIGH (ref 1.4–7.0)
Neutrophils: 78 %
Platelets: 192 10*3/uL (ref 150–450)
RBC: 4.7 x10E6/uL (ref 4.14–5.80)
RDW: 14 % (ref 12.3–15.4)
WBC: 10.8 10*3/uL (ref 3.4–10.8)

## 2017-07-27 LAB — PSA, TOTAL AND FREE
PSA FREE PCT: 33 %
PSA, Free: 0.33 ng/mL
Prostate Specific Ag, Serum: 1 ng/mL (ref 0.0–4.0)

## 2017-07-27 LAB — TSH: TSH: 1.69 u[IU]/mL (ref 0.450–4.500)

## 2017-07-27 LAB — VITAMIN D 25 HYDROXY (VIT D DEFICIENCY, FRACTURES): VIT D 25 HYDROXY: 38.5 ng/mL (ref 30.0–100.0)

## 2017-07-29 ENCOUNTER — Ambulatory Visit (INDEPENDENT_AMBULATORY_CARE_PROVIDER_SITE_OTHER): Payer: PPO | Admitting: Family

## 2017-07-29 ENCOUNTER — Encounter: Payer: Self-pay | Admitting: Family

## 2017-07-29 VITALS — BP 145/89 | HR 93 | Temp 97.4°F | Ht 68.0 in | Wt 189.4 lb

## 2017-07-29 DIAGNOSIS — K219 Gastro-esophageal reflux disease without esophagitis: Secondary | ICD-10-CM | POA: Diagnosis not present

## 2017-07-29 DIAGNOSIS — K921 Melena: Secondary | ICD-10-CM | POA: Diagnosis not present

## 2017-07-29 DIAGNOSIS — R1084 Generalized abdominal pain: Secondary | ICD-10-CM | POA: Diagnosis not present

## 2017-07-29 MED ORDER — OMEPRAZOLE 20 MG PO CPDR: 20.0000 mg | DELAYED_RELEASE_CAPSULE | Freq: Every day | ORAL | 3 refills | Status: DC

## 2017-07-29 NOTE — Progress Notes (Signed)
Subjective:    Patient ID: Charles Beltran, male    DOB: 06/23/1950, 67 y.o.   MRN: [id number]  Chief Complaint  Patient presents with  . Abdominal Pain    black stool   Pt presents to the office today with intermittent abdominal pain that he reports has been going on for a few weeks. We saw each other last week and he states he forgot to mention this.   He reports he had stopped his PPI about 2-3 months ago because he thought this was causing his right shoulder pain. We discussed this and believe his shoulder pain is caused by arthritis.   He reports he is having black stools that he noticed this morning. CBC drawn last week was normal.  Abdominal Pain  This is a new problem. The current episode started 1 to 4 weeks ago. The onset quality is gradual. The problem occurs intermittently. The problem has been waxing and waning. The pain is located in the generalized abdominal region. The pain is at a severity of 3/10. The pain is mild. The quality of the pain is dull. The abdominal pain does not radiate. Associated symptoms include diarrhea and flatus. Pertinent negatives include no belching, constipation, dysuria, headaches, hematochezia, hematuria, nausea or vomiting. Nothing aggravates the pain. The pain is relieved by nothing. He has tried nothing for the symptoms. The treatment provided no relief.      Review of Systems  Gastrointestinal: Positive for abdominal pain, diarrhea and flatus. Negative for constipation, hematochezia, nausea and vomiting.  Genitourinary: Negative for dysuria and hematuria.  Neurological: Negative for headaches.  All other systems reviewed and are negative.      Objective:   Physical Exam  Constitutional: He is oriented to person, place, and time. He appears well-developed and well-nourished. No distress.  HENT:  Head: Normocephalic.  Right Ear: External ear normal.  Left Ear: External ear normal.  Eyes: Pupils are equal, round, and reactive to light.  Right eye exhibits no discharge. Left eye exhibits no discharge.  Neck: Normal range of motion. Neck supple. No thyromegaly present.  Cardiovascular: Normal rate, regular rhythm, normal heart sounds and intact distal pulses.  No murmur heard. Pulmonary/Chest: Effort normal and breath sounds normal. No respiratory distress. He has no wheezes.  Abdominal: Soft. Bowel sounds are normal. He exhibits no distension. There is no tenderness.  Musculoskeletal: Normal range of motion. He exhibits no edema or tenderness.  Neurological: He is alert and oriented to person, place, and time. He has normal reflexes. No cranial nerve deficit.  Skin: Skin is warm and dry. No rash noted. No erythema.  Psychiatric: He has a normal mood and affect. His behavior is normal. Judgment and thought content normal.  Vitals reviewed.    BP (!) 145/89   Pulse 93   Temp (!) 97.4 F (36.3 C) (Oral)   Ht 5\' 8"  (1.727 m)   Wt 189 lb 6.4 oz (85.9 kg)   BMI 28.80 kg/m      Assessment & Plan:  Charles Beltran comes in today with chief complaint of Abdominal Pain (black stool)   Diagnosis and orders addressed:  1. Generalized abdominal pain - Fecal occult blood, imunochemical; Future  2. Gastroesophageal reflux disease, esophagitis presence not specified -Will restart Omeprazole today -Diet discussed- Avoid fried, spicy, citrus foods, caffeine and alcohol -Do not eat 2-3 hours before bedtime -Encouraged small frequent meals -Avoid NSAID's -RTO prn - omeprazole (PRILOSEC) 20 MG capsule; Take 1 capsule (20 mg total)  by mouth daily.  Dispense: 90 capsule; Refill: 3  3. Black stool - Fecal occult blood, imunochemical; Future   Jannifer Rodney, FNP

## 2017-07-29 NOTE — Patient Instructions (Signed)

## 2017-08-07 ENCOUNTER — Ambulatory Visit: Payer: PPO

## 2017-08-27 ENCOUNTER — Ambulatory Visit (INDEPENDENT_AMBULATORY_CARE_PROVIDER_SITE_OTHER): Payer: PPO | Admitting: Family

## 2017-08-27 ENCOUNTER — Encounter: Payer: Self-pay | Admitting: Family

## 2017-08-27 VITALS — BP 138/76 | HR 78 | Temp 97.5°F | Ht 68.0 in | Wt 191.4 lb

## 2017-08-27 DIAGNOSIS — R42 Dizziness and giddiness: Secondary | ICD-10-CM | POA: Diagnosis not present

## 2017-08-27 MED ORDER — MECLIZINE HCL 25 MG PO TABS: 25.0000 mg | ORAL_TABLET | Freq: Three times a day (TID) | ORAL | 0 refills | Status: DC | PRN

## 2017-08-27 MED ORDER — FLUTICASONE PROPIONATE 50 MCG/ACT NA SUSP: 2.0000 | Freq: Every day | NASAL | 6 refills | Status: DC

## 2017-08-27 NOTE — Progress Notes (Signed)
   Subjective:    Patient ID: Charles Beltran, male    DOB: 12/05/1950, 67 y.o.   MRHaskel Beltran: [id number]  Chief Complaint  Patient presents with  . Dizziness    discuss medication    Dizziness  This is a new problem. The current episode started in the past 7 days. The problem occurs intermittently. The problem has been unchanged. Associated symptoms include headaches and vertigo. Pertinent negatives include no congestion, coughing, fever, sore throat, vomiting or weakness. Nothing aggravates the symptoms. He has tried rest and relaxation for the symptoms. The treatment provided mild relief.      Review of Systems  Constitutional: Negative for fever.  HENT: Negative for congestion and sore throat.   Respiratory: Negative for cough.   Gastrointestinal: Negative for vomiting.  Neurological: Positive for dizziness, vertigo and headaches. Negative for weakness.  All other systems reviewed and are negative.      Objective:   Physical Exam  Constitutional: He is oriented to person, place, and time. He appears well-developed and well-nourished. No distress.  HENT:  Head: Normocephalic.  Right Ear: External ear normal.  Left Ear: External ear normal.  Nose: Rhinorrhea present.  Mouth/Throat: Posterior oropharyngeal erythema present.  Eyes: Pupils are equal, round, and reactive to light. Right eye exhibits no discharge. Left eye exhibits no discharge.  Neck: Normal range of motion. Neck supple. No thyromegaly present.  Cardiovascular: Normal rate, regular rhythm and intact distal pulses.  Murmur heard. Pulmonary/Chest: Effort normal and breath sounds normal. No respiratory distress. He has no wheezes.  Abdominal: Soft. Bowel sounds are normal. He exhibits no distension. There is no tenderness.  Musculoskeletal: Normal range of motion. He exhibits no edema or tenderness.  Neurological: He is alert and oriented to person, place, and time. He has normal reflexes. No cranial nerve deficit.  Skin:  Skin is warm and dry. No rash noted. No erythema.  Psychiatric: He has a normal mood and affect. His behavior is normal. Judgment and thought content normal.  Vitals reviewed.    BP 138/76   Pulse 78   Temp (!) 97.5 F (36.4 C) (Oral)   Ht 5\' 8"  (1.727 m)   Wt 191 lb 6.4 oz (86.8 kg)   BMI 29.10 kg/m      Assessment & Plan:  Charles SchroederJames M Beltran comes in today with chief complaint of Dizziness (discuss medication)   Diagnosis and orders addressed:  1. Vertigo Fall preventions discussed Epley exercises discussed- Handout given RTO if symptoms worsen or do not improve Labs improved  - meclizine (ANTIVERT) 25 MG tablet; Take 1 tablet (25 mg total) by mouth 3 (three) times daily as needed for dizziness.  Dispense: 30 tablet; Refill: 0 - fluticasone (FLONASE) 50 MCG/ACT nasal spray; Place 2 sprays into both nostrils daily.  Dispense: 16 g; Refill: 6   Follow up plan: Keep follow up appt   Jannifer Rodneyhristy Martiza Speth, FNP

## 2017-08-27 NOTE — Patient Instructions (Signed)
Vertigo °Vertigo is the feeling that you or your surroundings are moving when they are not. Vertigo can be dangerous if it occurs while you are doing something that could endanger you or others, such as driving. °What are the causes? °This condition is caused by a disturbance in the signals that are sent by your body’s sensory systems to your brain. Different causes of a disturbance can lead to vertigo, including: °· Infections, especially in the inner ear. °· A bad reaction to a drug, or misuse of alcohol and medicines. °· Withdrawal from drugs or alcohol. °· Quickly changing positions, as when lying down or rolling over in bed. °· Migraine headaches. °· Decreased blood flow to the brain. °· Decreased blood pressure. °· Increased pressure in the brain from a head or neck injury, stroke, infection, tumor, or bleeding. °· Central nervous system disorders. ° °What are the signs or symptoms? °Symptoms of this condition usually occur when you move your head or your eyes in different directions. Symptoms may start suddenly, and they usually last for less than a minute. Symptoms may include: °· Loss of balance and falling. °· Feeling like you are spinning or moving. °· Feeling like your surroundings are spinning or moving. °· Nausea and vomiting. °· Blurred vision or double vision. °· Difficulty hearing. °· Slurred speech. °· Dizziness. °· Involuntary eye movement (nystagmus). ° °Symptoms can be mild and cause only slight annoyance, or they can be severe and interfere with daily life. Episodes of vertigo may return (recur) over time, and they are often triggered by certain movements. Symptoms may improve over time. °How is this diagnosed? °This condition may be diagnosed based on medical history and the quality of your nystagmus. Your health care provider may test your eye movements by asking you to quickly change positions to trigger the nystagmus. This may be called the Dix-Hallpike test, head thrust test, or roll test.  You may be referred to a health care provider who specializes in ear, nose, and throat (ENT) problems (otolaryngologist) or a provider who specializes in disorders of the central nervous system (neurologist). °You may have additional testing, including: °· A physical exam. °· Blood tests. °· MRI. °· A CT scan. °· An electrocardiogram (ECG). This records electrical activity in your heart. °· An electroencephalogram (EEG). This records electrical activity in your brain. °· Hearing tests. ° °How is this treated? °Treatment for this condition depends on the cause and the severity of the symptoms. Treatment options include: °· Medicines to treat nausea or vertigo. These are usually used for severe cases. Some medicines that are used to treat other conditions may also reduce or eliminate vertigo symptoms. These include: °? Medicines that control allergies (antihistamines). °? Medicines that control seizures (anticonvulsants). °? Medicines that relieve depression (antidepressants). °? Medicines that relieve anxiety (sedatives). °· Head movements to adjust your inner ear back to normal. If your vertigo is caused by an ear problem, your health care provider may recommend certain movements to correct the problem. °· Surgery. This is rare. ° °Follow these instructions at home: °Safety °· Move slowly.Avoid sudden body or head movements. °· Avoid driving. °· Avoid operating heavy machinery. °· Avoid doing any tasks that would cause danger to you or others if you would have a vertigo episode during the task. °· If you have trouble walking or keeping your balance, try using a cane for stability. If you feel dizzy or unstable, sit down right away. °· Return to your normal activities as told by your   health care provider. Ask your health care provider what activities are safe for you. °General instructions °· Take over-the-counter and prescription medicines only as told by your health care provider. °· Avoid certain positions or  movements as told by your health care provider. °· Drink enough fluid to keep your urine clear or pale yellow. °· Keep all follow-up visits as told by your health care provider. This is important. °Contact a health care provider if: °· Your medicines do not relieve your vertigo or they make it worse. °· You have a fever. °· Your condition gets worse or you develop new symptoms. °· Your family or friends notice any behavioral changes. °· Your nausea or vomiting gets worse. °· You have numbness or a “pins and needles” sensation in part of your body. °Get help right away if: °· You have difficulty moving or speaking. °· You are always dizzy. °· You faint. °· You develop severe headaches. °· You have weakness in your hands, arms, or legs. °· You have changes in your hearing or vision. °· You develop a stiff neck. °· You develop sensitivity to light. °This information is not intended to replace advice given to you by your health care provider. Make sure you discuss any questions you have with your health care provider. °Document Released: 11/01/2004 Document Revised: 07/06/2015 Document Reviewed: 05/17/2014 °Elsevier Interactive Patient Education © 2018 [employer] ° °

## 2017-08-30 ENCOUNTER — Other Ambulatory Visit: Payer: Self-pay | Admitting: Family

## 2017-08-31 ENCOUNTER — Other Ambulatory Visit: Payer: Self-pay | Admitting: Family

## 2017-09-23 ENCOUNTER — Ambulatory Visit (INDEPENDENT_AMBULATORY_CARE_PROVIDER_SITE_OTHER): Payer: PPO | Admitting: Pediatrics

## 2017-09-23 ENCOUNTER — Encounter: Payer: Self-pay | Admitting: Pediatrics

## 2017-09-23 VITALS — BP 138/78 | HR 81 | Temp 98.0°F | Ht 68.0 in | Wt 193.4 lb

## 2017-09-23 DIAGNOSIS — R079 Chest pain, unspecified: Secondary | ICD-10-CM | POA: Diagnosis not present

## 2017-09-23 NOTE — Progress Notes (Signed)
  Subjective:   Patient ID: Charles Beltran, male    DOB: 01/12/1951, 67 y.o.   MRN: [id number] CC: Chest Pain (sporadic episodes)  HPI: Charles Beltran is a 67 y.o. male   Going back several months has had 3-4 very brief episodes of chest pain that last for a second then resolve. No lingering symptoms. No associated SOB, CP, chest tightness, nausea or vomiting.  Yesterday morning was most recent episode, occurred when he was lifting his right arm up, felt a sharp pain across the front of his chest.  Lasted no more than a second.  He does not remember what he was doing when the other episodes came on.  He says he is minimally active.  He lives in an apartment building does have to walk up flights of stairs regularly.  He has no shortness of breath, chest pain, chest tightness with exertion.  No family history of heart problems.  Personal history of hyperlipidemia, hypertension.  Relevant past medical, surgical, family and social history reviewed. Allergies and medications reviewed and updated. Social History   Tobacco Use  Smoking Status Never Smoker  Smokeless Tobacco Never Used   ROS: Per HPI   Objective:    BP 138/78   Pulse 81   Temp 98 F (36.7 C) (Oral)   Ht 5\' 8"  (1.727 m)   Wt 193 lb 6 oz (87.7 kg)   BMI 29.40 kg/m   Wt Readings from Last 3 Encounters:  09/23/17 193 lb 6 oz (87.7 kg)  08/27/17 191 lb 6.4 oz (86.8 kg)  07/29/17 189 lb 6.4 oz (85.9 kg)    Gen: NAD, alert, cooperative with exam, NCAT EYES: EOMI, no conjunctival injection, or no icterus ENT:   OP without erythema LYMPH: no cervical LAD CV: NRRR, normal S1/S2, no murmur, distal pulses 2+ b/l Resp: CTABL, no wheezes, normal WOB Abd: +BS, soft, NTND. no guarding or organomegaly Ext: No edema, warm Neuro: Alert and oriented, strength equal b/l UE and LE, coordination grossly normal MSK: normal muscle bulk  Assessment & Plan:  Charles Beltran was seen today for chest pain.  Diagnoses and all orders for this  visit:  Chest pain, unspecified type Atypical chest pain.  Risk factors include hyperlipidemia, hypertension.  Recommended stress test, with referral to cardiology.  EKG similar to prior, normal sinus rhythm,.  Patient declined.  Any new symptoms, patient needs to be seen.  Return precautions discussed. -     EKG 12-Lead   Follow up plan: Return in about 4 months (around 01/23/2018), or if symptoms worsen or fail to improve. Rex Krasarol Theodis Kinsel, MD [facility]

## 2017-11-19 ENCOUNTER — Encounter: Payer: Self-pay | Admitting: *Deleted

## 2018-01-28 ENCOUNTER — Ambulatory Visit: Payer: PPO | Admitting: Family

## 2018-01-30 ENCOUNTER — Encounter: Payer: Self-pay | Admitting: Family

## 2018-01-30 ENCOUNTER — Ambulatory Visit (INDEPENDENT_AMBULATORY_CARE_PROVIDER_SITE_OTHER): Payer: PPO | Admitting: Family

## 2018-01-30 VITALS — BP 139/87 | HR 97 | Temp 98.4°F | Ht 68.0 in | Wt 196.4 lb

## 2018-01-30 DIAGNOSIS — Z1211 Encounter for screening for malignant neoplasm of colon: Secondary | ICD-10-CM | POA: Diagnosis not present

## 2018-01-30 DIAGNOSIS — E785 Hyperlipidemia, unspecified: Secondary | ICD-10-CM

## 2018-01-30 DIAGNOSIS — E663 Overweight: Secondary | ICD-10-CM

## 2018-01-30 DIAGNOSIS — K219 Gastro-esophageal reflux disease without esophagitis: Secondary | ICD-10-CM | POA: Diagnosis not present

## 2018-01-30 DIAGNOSIS — Z1212 Encounter for screening for malignant neoplasm of rectum: Secondary | ICD-10-CM | POA: Diagnosis not present

## 2018-01-30 DIAGNOSIS — E559 Vitamin D deficiency, unspecified: Secondary | ICD-10-CM

## 2018-01-30 DIAGNOSIS — I10 Essential (primary) hypertension: Secondary | ICD-10-CM

## 2018-01-30 NOTE — Patient Instructions (Signed)

## 2018-01-30 NOTE — Progress Notes (Signed)
Subjective:    Patient ID: Charles Beltran, male    DOB: Jun 12, 1950, 67 y.o.   MRN: [id number]  Chief Complaint  Patient presents with  . Medical Management of Chronic Issues   Pt presents to the office today for chronic follow up.  Hypertension  This is a chronic problem. The current episode started more than 1 year ago. The problem has been resolved since onset. The problem is controlled. Pertinent negatives include no malaise/fatigue, peripheral edema or shortness of breath. Risk factors for coronary artery disease include dyslipidemia, male gender and obesity. The current treatment provides moderate improvement. There is no history of kidney disease, CAD/MI, CVA or heart failure.  Gastroesophageal Reflux  He reports no belching, no coughing or no heartburn. This is a chronic problem. The current episode started more than 1 year ago. The problem occurs rarely. The problem has been resolved. He has tried a PPI for the symptoms. The treatment provided moderate relief.  Hyperlipidemia  This is a chronic problem. The current episode started more than 1 year ago. The problem is controlled. Recent lipid tests were reviewed and are normal. Pertinent negatives include no shortness of breath. Current antihyperlipidemic treatment includes statins. The current treatment provides moderate improvement of lipids. Risk factors for coronary artery disease include dyslipidemia, male sex, hypertension and a sedentary lifestyle.      Review of Systems  Constitutional: Negative for malaise/fatigue.  Respiratory: Negative for cough and shortness of breath.   Gastrointestinal: Negative for heartburn.  All other systems reviewed and are negative.      Objective:   Physical Exam Vitals signs reviewed.  Constitutional:      General: He is not in acute distress.    Appearance: He is well-developed.  HENT:     Head: Normocephalic.     Right Ear: External ear normal.     Left Ear: External ear normal.    Eyes:     General:        Right eye: No discharge.        Left eye: No discharge.     Pupils: Pupils are equal, round, and reactive to light.  Neck:     Musculoskeletal: Normal range of motion and neck supple.     Thyroid: No thyromegaly.  Cardiovascular:     Rate and Rhythm: Normal rate and regular rhythm.     Heart sounds: Murmur present.  Pulmonary:     Effort: Pulmonary effort is normal. No respiratory distress.     Breath sounds: Normal breath sounds. No wheezing.  Abdominal:     General: Bowel sounds are normal. There is no distension.     Palpations: Abdomen is soft.     Tenderness: There is no abdominal tenderness.  Musculoskeletal: Normal range of motion.        General: No tenderness.  Skin:    General: Skin is warm and dry.     Findings: No erythema or rash.  Neurological:     Mental Status: He is alert and oriented to person, place, and time.     Cranial Nerves: No cranial nerve deficit.     Deep Tendon Reflexes: Reflexes are normal and symmetric.  Psychiatric:        Mood and Affect: Mood is anxious.        Behavior: Behavior normal.        Thought Content: Thought content normal.        Judgment: Judgment normal.  BP 139/87   Pulse 97   Temp 98.4 F (36.9 C) (Oral)   Ht '5\' 8"'  (1.727 m)   Wt 196 lb 6.4 oz (89.1 kg)   BMI 29.86 kg/m      Assessment & Plan:  Charles Beltran comes in today with chief complaint of Medical Management of Chronic Issues   Diagnosis and orders addressed:  1. Essential hypertension - CMP14+EGFR  2. Gastroesophageal reflux disease, esophagitis presence not specified - CMP14+EGFR  3. Hyperlipidemia, unspecified hyperlipidemia type - CMP14+EGFR  4. Overweight (BMI 25.0-29.9) - CMP14+EGFR  5. Vitamin D deficiency - CMP14+EGFR  6. Colon cancer screening - CMP14+EGFR - Cologuard  7. Screening for malignant neoplasm of the rectum - CMP14+EGFR - Cologuard   Labs pending Health Maintenance reviewed Diet  and exercise encouraged  Follow up plan: 6 months    Evelina Dun, FNP

## 2018-01-31 LAB — CMP14+EGFR
ALK PHOS: 61 IU/L (ref 39–117)
ALT: 34 IU/L (ref 0–44)
AST: 27 IU/L (ref 0–40)
Albumin/Globulin Ratio: 2 (ref 1.2–2.2)
Albumin: 4.7 g/dL (ref 3.6–4.8)
BUN/Creatinine Ratio: 21 (ref 10–24)
BUN: 23 mg/dL (ref 8–27)
Bilirubin Total: 0.6 mg/dL (ref 0.0–1.2)
CO2: 22 mmol/L (ref 20–29)
Calcium: 10 mg/dL (ref 8.6–10.2)
Chloride: 98 mmol/L (ref 96–106)
Creatinine, Ser: 1.12 mg/dL (ref 0.76–1.27)
GFR calc Af Amer: 79 mL/min/{1.73_m2} (ref >59)
GFR calc non Af Amer: 68 mL/min/{1.73_m2} (ref >59)
GLUCOSE: 130 mg/dL — AB (ref 65–99)
Globulin, Total: 2.4 g/dL (ref 1.5–4.5)
Potassium: 4.4 mmol/L (ref 3.5–5.2)
Sodium: 136 mmol/L (ref 134–144)
Total Protein: 7.1 g/dL (ref 6.0–8.5)

## 2018-02-21 ENCOUNTER — Other Ambulatory Visit: Payer: Self-pay | Admitting: Family

## 2018-06-10 ENCOUNTER — Telehealth: Payer: Self-pay | Admitting: Family

## 2018-07-14 ENCOUNTER — Telehealth: Payer: Self-pay | Admitting: Family

## 2018-07-14 DIAGNOSIS — Z0000 Encounter for general adult medical examination without abnormal findings: Secondary | ICD-10-CM

## 2018-07-14 NOTE — Telephone Encounter (Signed)
Labs ordered.

## 2018-07-23 ENCOUNTER — Encounter: Payer: Self-pay | Admitting: *Deleted

## 2018-07-23 ENCOUNTER — Ambulatory Visit (INDEPENDENT_AMBULATORY_CARE_PROVIDER_SITE_OTHER): Payer: PPO | Admitting: *Deleted

## 2018-07-23 DIAGNOSIS — Z0000 Encounter for general adult medical examination without abnormal findings: Secondary | ICD-10-CM | POA: Diagnosis not present

## 2018-07-23 NOTE — Progress Notes (Signed)
MEDICARE ANNUAL WELLNESS VISIT  07/23/2018  Telephone Visit Disclaimer This Medicare AWV was conducted by telephone due to national recommendations for restrictions regarding the COVID-19 Pandemic (e.g. social distancing).  I verified, using two identifiers, that I am speaking with Charles Beltran or their authorized healthcare agent. I discussed the limitations, risks, security, and privacy concerns of performing an evaluation and management service by telephone and the potential availability of an in-person appointment in the future. The patient expressed understanding and agreed to proceed.   Subjective:  Charles Beltran is a 68 y.o. male patient of Hawks, Theador Hawthorne, FNP who had a Medicare Annual Wellness Visit today via telephone. Cordaryl is retired from [employer],  and lives alone. He does not have children or pets.  He reports that he is somewhat socially active and does interact with friends/family regularly. He is minimally physically active and enjoys working outside.  Patient Care Team: Sharion Balloon, FNP as PCP - General (Family Medicine)  Advanced Directives 07/23/2018 03/08/2014  Does Patient Have a Medical Advance Directive? No No  Would patient like information on creating a medical advance directive? No - Patient declined Yes - Educational materials given    Hospital Utilization Over the Past 12 Months: # of hospitalizations or ER visits: 0 # of surgeries: 0  Review of Systems    Patient reports that his overall health is unchanged compared to last year.    Review of Systems:   All systems negative.  Pain Assessment Pain Score: 0-No pain     Current Medications & Allergies (verified) Allergies as of 07/23/2018      Reactions   Penicillins Shortness Of Breath      Medication List       Accurate as of July 23, 2018 10:43 AM. If you have any questions, ask your nurse or doctor.        ASPIRIN 81 PO Take 1 tablet by mouth daily.   atorvastatin 40 MG  tablet Commonly known as: LIPITOR TAKE 1 TABLET BY MOUTH EVERY DAY   fish oil-omega-3 fatty acids 1000 MG capsule Take 1 g by mouth. One bid   fluticasone 50 MCG/ACT nasal spray Commonly known as: FLONASE Place 2 sprays into both nostrils daily.   lisinopril-hydrochlorothiazide 20-12.5 MG tablet Commonly known as: ZESTORETIC TAKE 2 TABLETS BY MOUTH EVERY DAY   omeprazole 20 MG capsule Commonly known as: PRILOSEC Take 1 capsule (20 mg total) by mouth daily.   Vitamin D 50 MCG (2000 UT) tablet Take 2,000 Units by mouth daily.       History (reviewed): Past Medical History:  Diagnosis Date  . Childhood asthma   . Hyperlipidemia   . Hypertension    No past surgical history on file. Family History  Problem Relation Age of Onset  . Epilepsy Mother   . Cancer Father        throat  . Alcohol abuse Father   . Heart attack Brother   . Stroke Brother    Social History   Socioeconomic History  . Marital status: Single    Spouse name: Not on file  . Number of children: Not on file  . Years of education: Not on file  . Highest education level: Not on file  Occupational History  . Not on file  Social Needs  . Financial resource strain: Not hard at all  . Food insecurity    Worry: Never true    Inability: Never true  .  Transportation needs    Medical: No    Non-medical: No  Tobacco Use  . Smoking status: Never Smoker  . Smokeless tobacco: Never Used  Substance and Sexual Activity  . Alcohol use: No  . Drug use: No  . Sexual activity: Not on file  Lifestyle  . Physical activity    Days per week: 0 days    Minutes per session: Not on file  . Stress: Not at all  Relationships  . Social Musicianconnections    Talks on phone: Three times a week    Gets together: Once a week    Attends religious service: Never    Active member of club or organization: No    Attends meetings of clubs or organizations: Never    Relationship status: Not on file  Other Topics Concern  .  Not on file  Social History Narrative  . Not on file    Activities of Daily Living In your present state of health, do you have any difficulty performing the following activities: 07/23/2018  Hearing? N  Vision? N  Difficulty concentrating or making decisions? N  Walking or climbing stairs? N  Dressing or bathing? N  Doing errands, shopping? N  Preparing Food and eating ? N  Using the Toilet? N  In the past six months, have you accidently leaked urine? N  Do you have problems with loss of bowel control? N  Managing your Medications? N  Managing your Finances? N  Housekeeping or managing your Housekeeping? N  Some recent data might be hidden    Patient Literacy    Exercise Current Exercise Habits: The patient does not participate in regular exercise at present  Diet Patient reports consuming 3 meals a day and 1 snack(s) a day Patient reports that his primary diet is: Regular Patient reports that she does have regular access to food.   Depression Screen PHQ 2/9 Scores 07/23/2018 01/30/2018 09/23/2017 08/27/2017 07/29/2017 07/26/2017 07/22/2017  PHQ - 2 Score 0 0 0 0 0 0 0     Fall Risk Fall Risk  07/23/2018 01/30/2018 09/23/2017 08/27/2017 07/29/2017  Falls in the past year? 0 0 No No No  Number falls in past yr: - - - - -  Injury with Fall? - - - - -     Objective:  [name] seemed alert and oriented and he participated appropriately during our telephone visit.  Blood Pressure Weight BMI  BP Readings from Last 3 Encounters:  01/30/18 139/87  09/23/17 138/78  08/27/17 138/76   Wt Readings from Last 3 Encounters:  01/30/18 196 lb 6.4 oz (89.1 kg)  09/23/17 193 lb 6 oz (87.7 kg)  08/27/17 191 lb 6.4 oz (86.8 kg)   BMI Readings from Last 1 Encounters:  01/30/18 29.86 kg/m    *Unable to obtain current vital signs, weight, and BMI due to telephone visit type  Hearing/Vision  . [name] did not seem to have difficulty with hearing/understanding during the telephone  conversation . Reports that he has had a formal eye exam by an eye care professional within the past year . Reports that he has not had a formal hearing evaluation within the past year *Unable to fully assess hearing and vision during telephone visit type  Cognitive Function: 6CIT Screen 07/23/2018  What Year? 0 points  What month? 0 points  What time? 0 points  Count back from 20 0 points  Months in reverse 0 points  Repeat phrase 0 points  Total Score  0   (Normal:0-7, Significant for Dysfunction: >8)  Normal Cognitive Function Screening: Yes   Immunization & Health Maintenance Record Immunization History  Administered Date(s) Administered  . DTaP 02/05/2001  . Influenza Whole 10/26/2009  . Influenza, High Dose Seasonal PF 10/04/2016  . Influenza,inj,Quad PF,6+ Mos 10/31/2012, 11/03/2013, 09/19/2015  . Influenza-Unspecified 10/27/2014  . Pneumococcal Conjugate-13 02/10/2013  . Pneumococcal Polysaccharide-23 01/24/2017  . Tdap 12/14/2011  . Zoster 08/18/2015    Health Maintenance  Topic Date Due  . COLON CANCER SCREENING ANNUAL FOBT  06/18/2015  . INFLUENZA VACCINE  09/06/2018  . TETANUS/TDAP  12/13/2021  . COLONOSCOPY  08/15/2023  . Hepatitis C Screening  Completed  . PNA vac Low Risk Adult  Completed       Assessment  This is a routine wellness examination for Haskel SchroederJames M Keach.  Health Maintenance: Due or Overdue Health Maintenance Due  Topic Date Due  . COLON CANCER SCREENING ANNUAL FOBT  06/18/2015    Haskel SchroederJames M Pheasant does not need a referral for Community Assistance: Care Management:   no Social Work:    no Prescription Assistance:  no Nutrition/Diabetes Education:  no   Plan:  Personalized Goals Goals Addressed            This Visit's Progress   . Exercise 150 min/wk Moderate Activity       Walking is a great option.      Personalized Health Maintenance & Screening Recommendations  Advanced directives: has NO advanced directive - not interested  in additional information  Shingrix vaccine series  Lung Cancer Screening Recommended: no (Low Dose CT Chest recommended if Age 41-80 years, 30 pack-year currently smoking OR have quit w/in past 15 years) Hepatitis C Screening recommended: completed 10/05/2014   Advanced Directives: Written information was not prepared per patient's request.  Referrals & Orders N/A  Follow-up Plan . Follow-up with Junie SpencerHawks, Christy A, FNP as planned . Work on your goal of increasing your walking to 150 minutes total per week   I have personally reviewed and noted the following in the patient's chart:   . Medical and social history . Use of alcohol, tobacco or illicit drugs  . Current medications and supplements . Functional ability and status . Nutritional status . Physical activity . Advanced directives . List of other physicians . Hospitalizations, surgeries, and ER visits in previous 12 months . Vitals . Screenings to include cognitive, depression, and falls . Referrals and appointments  In addition, I have reviewed and discussed with Haskel SchroederJames M Leibensperger certain preventive protocols, quality metrics, and best practice recommendations. A written personalized care plan for preventive services as well as general preventive health recommendations is available and can be mailed to the patient at his request.      Lilia Arguemy Helder Crisafulli, RN  07/23/2018

## 2018-07-23 NOTE — Patient Instructions (Signed)
  Charles Beltran , Thank you for taking time to talk with me for  your Medicare Wellness Visit. I appreciate your ongoing commitment to your health goals. Please review the following plan we discussed and let me know if I can assist you in the future.   These are the goals we discussed: Goals    . Exercise 150 min/wk Moderate Activity     Walking is a great option.       This is a list of the screening recommended for you and due dates:  Health Maintenance  Topic Date Due  . Stool Blood Test  06/18/2015  . Flu Shot  09/06/2018  . Tetanus Vaccine  12/13/2021  . Colon Cancer Screening  08/15/2023  .  Hepatitis C: One time screening is recommended by Center for Disease Control  (CDC) for  adults born from [birth year] through 1964.   Completed  . Pneumonia vaccines  Completed

## 2018-07-29 ENCOUNTER — Other Ambulatory Visit: Payer: PPO

## 2018-07-29 ENCOUNTER — Other Ambulatory Visit: Payer: Self-pay

## 2018-07-29 DIAGNOSIS — E785 Hyperlipidemia, unspecified: Secondary | ICD-10-CM | POA: Diagnosis not present

## 2018-07-29 DIAGNOSIS — Z0000 Encounter for general adult medical examination without abnormal findings: Secondary | ICD-10-CM | POA: Diagnosis not present

## 2018-07-29 DIAGNOSIS — Z125 Encounter for screening for malignant neoplasm of prostate: Secondary | ICD-10-CM | POA: Diagnosis not present

## 2018-07-29 DIAGNOSIS — I10 Essential (primary) hypertension: Secondary | ICD-10-CM | POA: Diagnosis not present

## 2018-07-29 LAB — LIPID PANEL

## 2018-07-30 LAB — CBC WITH DIFFERENTIAL/PLATELET
Basophils Absolute: 0 10*3/uL (ref 0.0–0.2)
Basos: 0 %
EOS (ABSOLUTE): 0.3 10*3/uL (ref 0.0–0.4)
Eos: 4 %
Hematocrit: 43.5 % (ref 37.5–51.0)
Hemoglobin: 14.7 g/dL (ref 13.0–17.7)
Immature Grans (Abs): 0.1 10*3/uL (ref 0.0–0.1)
Immature Granulocytes: 1 %
Lymphocytes Absolute: 1.2 10*3/uL (ref 0.7–3.1)
Lymphs: 18 %
MCH: 30.2 pg (ref 26.6–33.0)
MCHC: 33.8 g/dL (ref 31.5–35.7)
MCV: 89 fL (ref 79–97)
Monocytes Absolute: 1.1 10*3/uL — ABNORMAL HIGH (ref 0.1–0.9)
Monocytes: 17 %
Neutrophils Absolute: 4 10*3/uL (ref 1.4–7.0)
Neutrophils: 60 %
Platelets: 155 10*3/uL (ref 150–450)
RBC: 4.87 x10E6/uL (ref 4.14–5.80)
RDW: 12.8 % (ref 11.6–15.4)
WBC: 6.8 10*3/uL (ref 3.4–10.8)

## 2018-07-30 LAB — CMP14+EGFR
ALT: 31 IU/L (ref 0–44)
AST: 20 IU/L (ref 0–40)
Albumin/Globulin Ratio: 2 (ref 1.2–2.2)
Albumin: 4.7 g/dL (ref 3.8–4.8)
Alkaline Phosphatase: 54 IU/L (ref 39–117)
BUN/Creatinine Ratio: 19 (ref 10–24)
BUN: 23 mg/dL (ref 8–27)
Bilirubin Total: 0.5 mg/dL (ref 0.0–1.2)
CO2: 22 mmol/L (ref 20–29)
Calcium: 9.8 mg/dL (ref 8.6–10.2)
Chloride: 97 mmol/L (ref 96–106)
Creatinine, Ser: 1.21 mg/dL (ref 0.76–1.27)
GFR calc Af Amer: 71 mL/min/{1.73_m2} (ref >59)
GFR calc non Af Amer: 62 mL/min/{1.73_m2} (ref >59)
Globulin, Total: 2.3 g/dL (ref 1.5–4.5)
Glucose: 110 mg/dL — ABNORMAL HIGH (ref 65–99)
Potassium: 4.6 mmol/L (ref 3.5–5.2)
Sodium: 136 mmol/L (ref 134–144)
Total Protein: 7 g/dL (ref 6.0–8.5)

## 2018-07-30 LAB — LIPID PANEL
Chol/HDL Ratio: 2.7 ratio (ref 0.0–5.0)
Cholesterol, Total: 131 mg/dL (ref 100–199)
HDL: 49 mg/dL (ref >39)
LDL Calculated: 66 mg/dL (ref 0–99)
Triglycerides: 80 mg/dL (ref 0–149)
VLDL Cholesterol Cal: 16 mg/dL (ref 5–40)

## 2018-07-30 LAB — TSH: TSH: 2.45 u[IU]/mL (ref 0.450–4.500)

## 2018-07-30 LAB — PSA, TOTAL AND FREE
PSA, Free Pct: 36.4 %
PSA, Free: 0.4 ng/mL
Prostate Specific Ag, Serum: 1.1 ng/mL (ref 0.0–4.0)

## 2018-08-05 ENCOUNTER — Encounter: Payer: Self-pay | Admitting: Family

## 2018-08-05 ENCOUNTER — Other Ambulatory Visit: Payer: Self-pay

## 2018-08-05 ENCOUNTER — Ambulatory Visit (INDEPENDENT_AMBULATORY_CARE_PROVIDER_SITE_OTHER): Payer: PPO | Admitting: Family

## 2018-08-05 VITALS — BP 157/78 | HR 90 | Temp 97.9°F | Ht 68.0 in | Wt 195.6 lb

## 2018-08-05 DIAGNOSIS — K219 Gastro-esophageal reflux disease without esophagitis: Secondary | ICD-10-CM

## 2018-08-05 DIAGNOSIS — H6123 Impacted cerumen, bilateral: Secondary | ICD-10-CM

## 2018-08-05 DIAGNOSIS — E785 Hyperlipidemia, unspecified: Secondary | ICD-10-CM | POA: Diagnosis not present

## 2018-08-05 DIAGNOSIS — E663 Overweight: Secondary | ICD-10-CM

## 2018-08-05 DIAGNOSIS — E559 Vitamin D deficiency, unspecified: Secondary | ICD-10-CM

## 2018-08-05 DIAGNOSIS — Z0000 Encounter for general adult medical examination without abnormal findings: Secondary | ICD-10-CM

## 2018-08-05 DIAGNOSIS — I10 Essential (primary) hypertension: Secondary | ICD-10-CM | POA: Diagnosis not present

## 2018-08-05 DIAGNOSIS — Z1211 Encounter for screening for malignant neoplasm of colon: Secondary | ICD-10-CM | POA: Diagnosis not present

## 2018-08-05 DIAGNOSIS — Z0001 Encounter for general adult medical examination with abnormal findings: Secondary | ICD-10-CM | POA: Diagnosis not present

## 2018-08-05 NOTE — Patient Instructions (Signed)
Earwax Buildup, Adult The ears produce a substance called earwax that helps keep bacteria out of the ear and protects the skin in the ear canal. Occasionally, earwax can build up in the ear and cause discomfort or hearing loss. What increases the risk? This condition is more likely to develop in people who:  Are male.  Are elderly.  Naturally produce more earwax.  Clean their ears often with cotton swabs.  Use earplugs often.  Use in-ear headphones often.  Wear hearing aids.  Have narrow ear canals.  Have earwax that is overly thick or sticky.  Have eczema.  Are dehydrated.  Have excess hair in the ear canal. What are the signs or symptoms? Symptoms of this condition include:  Reduced or muffled hearing.  A feeling of fullness in the ear or feeling that the ear is plugged.  Fluid coming from the ear.  Ear pain.  Ear itch.  Ringing in the ear.  Coughing.  An obvious piece of earwax that can be seen inside the ear canal. How is this diagnosed? This condition may be diagnosed based on:  Your symptoms.  Your medical history.  An ear exam. During the exam, your health care provider will look into your ear with an instrument called an otoscope. You may have tests, including a hearing test. How is this treated? This condition may be treated by:  Using ear drops to soften the earwax.  Having the earwax removed by a health care provider. The health care provider may: ? Flush the ear with water. ? Use an instrument that has a loop on the end (curette). ? Use a suction device.  Surgery to remove the wax buildup. This may be done in severe cases. Follow these instructions at home:   Take over-the-counter and prescription medicines only as told by your health care provider.  Do not put any objects, including cotton swabs, into your ear. You can clean the opening of your ear canal with a washcloth or facial tissue.  Follow instructions from your health care  provider about cleaning your ears. Do not over-clean your ears.  Drink enough fluid to keep your urine clear or pale yellow. This will help to thin the earwax.  Keep all follow-up visits as told by your health care provider. If earwax builds up in your ears often or if you use hearing aids, consider seeing your health care provider for routine, preventive ear cleanings. Ask your health care provider how often you should schedule your cleanings.  If you have hearing aids, clean them according to instructions from the manufacturer and your health care provider. Contact a health care provider if:  You have ear pain.  You develop a fever.  You have blood, pus, or other fluid coming from your ear.  You have hearing loss.  You have ringing in your ears that does not go away.  Your symptoms do not improve with treatment.  You feel like the room is spinning (vertigo). Summary  Earwax can build up in the ear and cause discomfort or hearing loss.  The most common symptoms of this condition include reduced or muffled hearing and a feeling of fullness in the ear or feeling that the ear is plugged.  This condition may be diagnosed based on your symptoms, your medical history, and an ear exam.  This condition may be treated by using ear drops to soften the earwax or by having the earwax removed by a health care provider.  Do not put any   objects, including cotton swabs, into your ear. You can clean the opening of your ear canal with a washcloth or facial tissue. This information is not intended to replace advice given to you by your health care provider. Make sure you discuss any questions you have with your health care provider. Document Released: 03/01/2004 Document Revised: 01/04/2017 Document Reviewed: 04/04/2016 Elsevier Patient Education  2020 Elsevier Inc.  

## 2018-08-05 NOTE — Progress Notes (Signed)
Subjective:    Patient ID: Charles Beltran, male    DOB: 05/19/1950, 68 y.o.   MRN: [id number]  Chief Complaint  Patient presents with  . Annual Exam   Pt presents to the office today for CPE.  Hypertension This is a chronic problem. The current episode started more than 1 year ago. The problem has been waxing and waning since onset. The problem is controlled. Pertinent negatives include no headaches, malaise/fatigue, peripheral edema or shortness of breath. Risk factors for coronary artery disease include dyslipidemia, obesity and male gender. The current treatment provides moderate improvement. There is no history of kidney disease, CAD/MI or heart failure.  Gastroesophageal Reflux He complains of heartburn. He reports no belching. This is a chronic problem. The current episode started more than 1 year ago. The problem occurs rarely. The problem has been waxing and waning. He has tried a PPI for the symptoms. The treatment provided moderate relief.  Hyperlipidemia This is a chronic problem. The current episode started more than 1 year ago. The problem is controlled. Recent lipid tests were reviewed and are normal. Pertinent negatives include no shortness of breath. Current antihyperlipidemic treatment includes statins. The current treatment provides moderate improvement of lipids. Risk factors for coronary artery disease include male sex, hypertension and a sedentary lifestyle.      Review of Systems  Constitutional: Negative for malaise/fatigue.  Respiratory: Negative for shortness of breath.   Gastrointestinal: Positive for heartburn.  Neurological: Negative for headaches.  All other systems reviewed and are negative.      Objective:   Physical Exam Vitals signs reviewed.  Constitutional:      General: He is not in acute distress.    Appearance: He is well-developed.  HENT:     Head: Normocephalic.     Right Ear: Tympanic membrane normal. There is impacted cerumen.     Left  Ear: Tympanic membrane normal. There is impacted cerumen.  Eyes:     General:        Right eye: No discharge.        Left eye: No discharge.     Pupils: Pupils are equal, round, and reactive to light.  Neck:     Musculoskeletal: Normal range of motion and neck supple.     Thyroid: No thyromegaly.  Cardiovascular:     Rate and Rhythm: Regular rhythm. Tachycardia present.     Heart sounds: Murmur present.  Pulmonary:     Effort: Pulmonary effort is normal. No respiratory distress.     Breath sounds: Normal breath sounds. No wheezing.  Abdominal:     General: Bowel sounds are normal. There is no distension.     Palpations: Abdomen is soft.     Tenderness: There is no abdominal tenderness.  Genitourinary:    Penis: Normal.      Scrotum/Testes:        Right: Mass, tenderness, swelling, testicular hydrocele or varicocele not present. Right testis is descended. Cremasteric reflex is present.         Left: Mass, tenderness, swelling, testicular hydrocele or varicocele not present. Left testis is descended. Cremasteric reflex is present.      Prostate: Normal.     Rectum: Normal.  Musculoskeletal: Normal range of motion.        General: No tenderness.  Skin:    General: Skin is warm and dry.     Findings: No erythema or rash.  Neurological:     Mental Status: He is alert and oriented  to person, place, and time.     Cranial Nerves: No cranial nerve deficit.     Deep Tendon Reflexes: Reflexes are normal and symmetric.  Psychiatric:        Behavior: Behavior normal.        Thought Content: Thought content normal.        Judgment: Judgment normal.    Bilateral ears washed with warm water and peroxide by nurse, TM- WNL  BP (!) 157/78   Pulse 90   Temp 97.9 F (36.6 C) (Oral)   Ht 5\' 8"  (1.727 m)   Wt 195 lb 9.6 oz (88.7 kg)   BMI 29.74 kg/m      Assessment & Plan:  Charles Beltran comes in today with chief complaint of Annual Exam   Diagnosis and orders addressed:  1.  Annual physical exam  2. Essential hypertension  3. Gastroesophageal reflux disease, esophagitis presence not specified  4. Hyperlipidemia, unspecified hyperlipidemia type  5. Overweight (BMI 25.0-29.9)  6. Vitamin D deficiency  8. Colon cancer screening - Fecal occult blood, imunochemical; Future   Labs reviewed  Health Maintenance reviewed Diet and exercise encouraged  Follow up plan: 6 months    Evelina Dun, FNP

## 2018-08-24 ENCOUNTER — Other Ambulatory Visit: Payer: Self-pay | Admitting: Family

## 2018-09-10 ENCOUNTER — Other Ambulatory Visit: Payer: Self-pay | Admitting: Family

## 2018-09-10 DIAGNOSIS — K219 Gastro-esophageal reflux disease without esophagitis: Secondary | ICD-10-CM

## 2018-12-19 ENCOUNTER — Encounter: Payer: Self-pay | Admitting: *Deleted

## 2019-01-02 DIAGNOSIS — R918 Other nonspecific abnormal finding of lung field: Secondary | ICD-10-CM | POA: Diagnosis not present

## 2019-01-02 DIAGNOSIS — R05 Cough: Secondary | ICD-10-CM | POA: Diagnosis not present

## 2019-01-02 DIAGNOSIS — I700 Atherosclerosis of aorta: Secondary | ICD-10-CM | POA: Diagnosis not present

## 2019-01-27 ENCOUNTER — Ambulatory Visit: Payer: PPO | Admitting: Family

## 2019-02-05 ENCOUNTER — Ambulatory Visit: Payer: PPO | Admitting: Family

## 2019-02-21 ENCOUNTER — Other Ambulatory Visit: Payer: Self-pay | Admitting: Family

## 2019-02-24 ENCOUNTER — Telehealth: Payer: Self-pay | Admitting: Family

## 2019-02-24 MED ORDER — ATORVASTATIN CALCIUM 40 MG PO TABS: ORAL_TABLET | ORAL | 5 refills | Status: DC

## 2019-02-24 MED ORDER — LISINOPRIL-HYDROCHLOROTHIAZIDE 20-12.5 MG PO TABS: 2.0000 | ORAL_TABLET | Freq: Every day | ORAL | 5 refills | Status: DC

## 2019-02-24 NOTE — Telephone Encounter (Signed)
What is the name of the medication? Charles Beltran & Charles Beltran  Have you contacted your pharmacy to request a refill? Yes  Which pharmacy would you like this sent to? [facility]   Patient notified that their request is being sent to the clinical staff for review and that they should receive a call once it is complete. If they do not receive a call within 24 hours they can check with their pharmacy or our office.   [name] pt. He picked up only 30 day supply on both meds & he usually gets 90 supply on both meds. Please call pt to let him know why.

## 2019-02-25 ENCOUNTER — Telehealth: Payer: Self-pay | Admitting: Family

## 2019-02-25 NOTE — Telephone Encounter (Signed)
Patient scheduled follow up/med refill appt with Christy on 03/10/2019. Wants to know if he needs to do blood work for this visit as well.

## 2019-02-26 NOTE — Telephone Encounter (Signed)
Left message, we will do lab work for your February visit.

## 2019-02-26 NOTE — Telephone Encounter (Signed)
Yes we can do lab work on that visit.

## 2019-03-05 ENCOUNTER — Other Ambulatory Visit: Payer: Self-pay

## 2019-03-05 ENCOUNTER — Other Ambulatory Visit: Payer: PPO

## 2019-03-05 ENCOUNTER — Other Ambulatory Visit: Payer: Self-pay | Admitting: Family

## 2019-03-05 DIAGNOSIS — K219 Gastro-esophageal reflux disease without esophagitis: Secondary | ICD-10-CM

## 2019-03-05 DIAGNOSIS — I10 Essential (primary) hypertension: Secondary | ICD-10-CM | POA: Diagnosis not present

## 2019-03-06 LAB — CBC WITH DIFFERENTIAL/PLATELET
Basophils Absolute: 0 10*3/uL (ref 0.0–0.2)
Basos: 0 %
EOS (ABSOLUTE): 0.3 10*3/uL (ref 0.0–0.4)
Eos: 4 %
Hematocrit: 46.2 % (ref 37.5–51.0)
Hemoglobin: 15.9 g/dL (ref 13.0–17.7)
Immature Grans (Abs): 0 10*3/uL (ref 0.0–0.1)
Immature Granulocytes: 0 %
Lymphocytes Absolute: 1.7 10*3/uL (ref 0.7–3.1)
Lymphs: 22 %
MCH: 30.3 pg (ref 26.6–33.0)
MCHC: 34.4 g/dL (ref 31.5–35.7)
MCV: 88 fL (ref 79–97)
Monocytes Absolute: 0.8 10*3/uL (ref 0.1–0.9)
Monocytes: 10 %
Neutrophils Absolute: 5.1 10*3/uL (ref 1.4–7.0)
Neutrophils: 64 %
Platelets: 193 10*3/uL (ref 150–450)
RBC: 5.24 x10E6/uL (ref 4.14–5.80)
RDW: 12.4 % (ref 11.6–15.4)
WBC: 8.1 10*3/uL (ref 3.4–10.8)

## 2019-03-06 LAB — CMP14+EGFR
ALT: 30 IU/L (ref 0–44)
AST: 25 IU/L (ref 0–40)
Albumin/Globulin Ratio: 2.1 (ref 1.2–2.2)
Albumin: 4.7 g/dL (ref 3.8–4.8)
Alkaline Phosphatase: 66 IU/L (ref 39–117)
BUN/Creatinine Ratio: 17 (ref 10–24)
BUN: 22 mg/dL (ref 8–27)
Bilirubin Total: 0.6 mg/dL (ref 0.0–1.2)
CO2: 24 mmol/L (ref 20–29)
Calcium: 9.7 mg/dL (ref 8.6–10.2)
Chloride: 98 mmol/L (ref 96–106)
Creatinine, Ser: 1.33 mg/dL — ABNORMAL HIGH (ref 0.76–1.27)
GFR calc Af Amer: 63 mL/min/{1.73_m2} (ref >59)
GFR calc non Af Amer: 55 mL/min/{1.73_m2} — ABNORMAL LOW (ref >59)
Globulin, Total: 2.2 g/dL (ref 1.5–4.5)
Glucose: 148 mg/dL — ABNORMAL HIGH (ref 65–99)
Potassium: 4.1 mmol/L (ref 3.5–5.2)
Sodium: 138 mmol/L (ref 134–144)
Total Protein: 6.9 g/dL (ref 6.0–8.5)

## 2019-03-09 ENCOUNTER — Other Ambulatory Visit: Payer: Self-pay

## 2019-03-10 ENCOUNTER — Encounter: Payer: Self-pay | Admitting: Family

## 2019-03-10 ENCOUNTER — Ambulatory Visit (INDEPENDENT_AMBULATORY_CARE_PROVIDER_SITE_OTHER): Payer: PPO | Admitting: Family

## 2019-03-10 VITALS — BP 139/71 | HR 85 | Temp 99.1°F | Ht 68.0 in | Wt 195.0 lb

## 2019-03-10 DIAGNOSIS — E663 Overweight: Secondary | ICD-10-CM

## 2019-03-10 DIAGNOSIS — I10 Essential (primary) hypertension: Secondary | ICD-10-CM

## 2019-03-10 DIAGNOSIS — E785 Hyperlipidemia, unspecified: Secondary | ICD-10-CM

## 2019-03-10 DIAGNOSIS — R011 Cardiac murmur, unspecified: Secondary | ICD-10-CM | POA: Diagnosis not present

## 2019-03-10 DIAGNOSIS — K219 Gastro-esophageal reflux disease without esophagitis: Secondary | ICD-10-CM | POA: Diagnosis not present

## 2019-03-10 MED ORDER — ATORVASTATIN CALCIUM 40 MG PO TABS: ORAL_TABLET | ORAL | 3 refills | Status: DC

## 2019-03-10 MED ORDER — LISINOPRIL-HYDROCHLOROTHIAZIDE 20-12.5 MG PO TABS: 2.0000 | ORAL_TABLET | Freq: Every day | ORAL | 4 refills | Status: DC

## 2019-03-10 MED ORDER — OMEPRAZOLE 20 MG PO CPDR: DELAYED_RELEASE_CAPSULE | ORAL | 4 refills | Status: DC

## 2019-03-10 NOTE — Progress Notes (Signed)
Subjective:    Patient ID: Charles Beltran, male    DOB: 04/28/1950, 69 y.o.   MRN: [id number]  Chief Complaint  Patient presents with  . Medical Management of Chronic Issues   PT presents to the office today for chronic follow up. PT had lab work drawn on 03/05/19. His creatinine at that time was slightly elevated. We will recheck today. Pt denies any NSAID use.  Hypertension This is a chronic problem. The current episode started more than 1 year ago. The problem has been resolved since onset. The problem is controlled. Pertinent negatives include no malaise/fatigue, peripheral edema or shortness of breath. Risk factors for coronary artery disease include male gender, obesity and sedentary lifestyle. The current treatment provides moderate improvement.  Hyperlipidemia This is a chronic problem. The current episode started more than 1 year ago. The problem is controlled. Recent lipid tests were reviewed and are normal. Pertinent negatives include no shortness of breath. Current antihyperlipidemic treatment includes statins. The current treatment provides moderate improvement of lipids.  Gastroesophageal Reflux He complains of belching and heartburn. This is a chronic problem. The current episode started more than 1 year ago. The problem occurs rarely. The problem has been waxing and waning. He has tried a PPI for the symptoms. The treatment provided moderate relief.      Review of Systems  Constitutional: Negative for malaise/fatigue.  Respiratory: Negative for shortness of breath.   Gastrointestinal: Positive for heartburn.  All other systems reviewed and are negative.      Objective:   Physical Exam Vitals reviewed.  Constitutional:      General: He is not in acute distress.    Appearance: He is well-developed.  HENT:     Head: Normocephalic.     Right Ear: Tympanic membrane normal.     Left Ear: Tympanic membrane normal.  Eyes:     General:        Right eye: No discharge.          Left eye: No discharge.     Pupils: Pupils are equal, round, and reactive to light.  Neck:     Thyroid: No thyromegaly.  Cardiovascular:     Rate and Rhythm: Normal rate and regular rhythm.     Heart sounds: Murmur present.  Pulmonary:     Effort: Pulmonary effort is normal. No respiratory distress.     Breath sounds: Normal breath sounds. No wheezing.  Abdominal:     General: Bowel sounds are normal. There is no distension.     Palpations: Abdomen is soft.     Tenderness: There is no abdominal tenderness.  Musculoskeletal:        General: No tenderness. Normal range of motion.     Cervical back: Normal range of motion and neck supple.  Skin:    General: Skin is warm and dry.     Findings: No erythema or rash.  Neurological:     Mental Status: He is alert and oriented to person, place, and time.     Cranial Nerves: No cranial nerve deficit.     Deep Tendon Reflexes: Reflexes are normal and symmetric.  Psychiatric:        Behavior: Behavior normal.        Thought Content: Thought content normal.        Judgment: Judgment normal.       BP (!) 141/73   Pulse 81   Temp 99.1 F (37.3 C) (Temporal)   Ht '5\' 8"'  (1.727  m)   Wt 195 lb (88.5 kg)   SpO2 99%   BMI 29.65 kg/m      Assessment & Plan:  Charles Beltran comes in today with chief complaint of Medical Management of Chronic Issues   Diagnosis and orders addressed:  1. Essential hypertension - lisinopril-hydrochlorothiazide (ZESTORETIC) 20-12.5 MG tablet; Take 2 tablets by mouth daily.  Dispense: 180 tablet; Refill: 4 - BMP8+EGFR  2. Gastroesophageal reflux disease, unspecified whether esophagitis present - omeprazole (PRILOSEC) 20 MG capsule; TAKE 1 CAPSULE BY MOUTH EVERY DAY  Dispense: 90 capsule; Refill: 4 - BMP8+EGFR  3. Hyperlipidemia, unspecified hyperlipidemia type - atorvastatin (LIPITOR) 40 MG tablet; TAKE 1 TABLET BY MOUTH EVERY DAY  Dispense: 90 tablet; Refill: 3 - BMP8+EGFR  4. Overweight (BMI  25.0-29.9) - BMP8+EGFR  5. Murmur Pt has not seen Cardiologists about this. Has had it since he was in his 63's. Does not wish to see Cardiologists at this time.    Labs pending Health Maintenance reviewed Diet and exercise encouraged  Follow up plan: 6 months   .Evelina Dun, FNP

## 2019-03-10 NOTE — Patient Instructions (Signed)

## 2019-03-11 LAB — BMP8+EGFR
BUN/Creatinine Ratio: 19 (ref 10–24)
BUN: 23 mg/dL (ref 8–27)
CO2: 22 mmol/L (ref 20–29)
Calcium: 9.9 mg/dL (ref 8.6–10.2)
Chloride: 98 mmol/L (ref 96–106)
Creatinine, Ser: 1.2 mg/dL (ref 0.76–1.27)
GFR calc Af Amer: 71 mL/min/{1.73_m2} (ref >59)
GFR calc non Af Amer: 62 mL/min/{1.73_m2} (ref >59)
Glucose: 104 mg/dL — ABNORMAL HIGH (ref 65–99)
Potassium: 4.5 mmol/L (ref 3.5–5.2)
Sodium: 137 mmol/L (ref 134–144)

## 2019-03-12 ENCOUNTER — Encounter: Payer: Self-pay | Admitting: Family Medicine

## 2019-03-17 ENCOUNTER — Encounter: Payer: Self-pay | Admitting: *Deleted

## 2019-03-24 ENCOUNTER — Telehealth: Payer: Self-pay | Admitting: Family

## 2019-03-27 NOTE — Telephone Encounter (Signed)
Lab results mailed to patient.

## 2019-05-07 ENCOUNTER — Telehealth: Payer: Self-pay | Admitting: Family

## 2019-05-07 ENCOUNTER — Ambulatory Visit (INDEPENDENT_AMBULATORY_CARE_PROVIDER_SITE_OTHER): Payer: PPO | Admitting: Family

## 2019-05-07 DIAGNOSIS — Z5329 Procedure and treatment not carried out because of patient's decision for other reasons: Secondary | ICD-10-CM

## 2019-05-07 NOTE — Telephone Encounter (Signed)
Needs appt

## 2019-05-07 NOTE — Progress Notes (Signed)
Attempted to call patient multiple times, goes straight to VM. Left Vm, pt called back, but when I call him it goes straight to VM.

## 2019-05-15 ENCOUNTER — Emergency Department (HOSPITAL_COMMUNITY)
Admission: EM | Admit: 2019-05-15 | Discharge: 2019-05-15 | Disposition: A | Discharge status: Home / Self Care | Payer: PPO | Attending: Emergency Medicine | Admitting: Emergency Medicine

## 2019-05-15 ENCOUNTER — Encounter (HOSPITAL_COMMUNITY): Payer: Self-pay | Admitting: *Deleted

## 2019-05-15 ENCOUNTER — Other Ambulatory Visit: Payer: Self-pay

## 2019-05-15 ENCOUNTER — Emergency Department (HOSPITAL_COMMUNITY): Payer: PPO

## 2019-05-15 DIAGNOSIS — I10 Essential (primary) hypertension: Secondary | ICD-10-CM | POA: Diagnosis not present

## 2019-05-15 DIAGNOSIS — R197 Diarrhea, unspecified: Secondary | ICD-10-CM | POA: Diagnosis not present

## 2019-05-15 DIAGNOSIS — R1084 Generalized abdominal pain: Secondary | ICD-10-CM | POA: Insufficient documentation

## 2019-05-15 DIAGNOSIS — K449 Diaphragmatic hernia without obstruction or gangrene: Secondary | ICD-10-CM

## 2019-05-15 DIAGNOSIS — R05 Cough: Secondary | ICD-10-CM | POA: Diagnosis not present

## 2019-05-15 DIAGNOSIS — Z79899 Other long term (current) drug therapy: Secondary | ICD-10-CM | POA: Insufficient documentation

## 2019-05-15 DIAGNOSIS — Z7982 Long term (current) use of aspirin: Secondary | ICD-10-CM | POA: Diagnosis not present

## 2019-05-15 DIAGNOSIS — R109 Unspecified abdominal pain: Secondary | ICD-10-CM | POA: Diagnosis not present

## 2019-05-15 LAB — COMPREHENSIVE METABOLIC PANEL
ALT: 38 U/L (ref 0–44)
AST: 27 U/L (ref 15–41)
Albumin: 4.4 g/dL (ref 3.5–5.0)
Alkaline Phosphatase: 64 U/L (ref 38–126)
Anion gap: 10 (ref 5–15)
BUN: 22 mg/dL (ref 8–23)
CO2: 25 mmol/L (ref 22–32)
Calcium: 9.5 mg/dL (ref 8.9–10.3)
Chloride: 97 mmol/L — ABNORMAL LOW (ref 98–111)
Creatinine, Ser: 1.02 mg/dL (ref 0.61–1.24)
GFR calc Af Amer: >60 mL/min (ref >60)
GFR calc non Af Amer: >60 mL/min (ref >60)
Glucose, Bld: 128 mg/dL — ABNORMAL HIGH (ref 70–99)
Potassium: 4.1 mmol/L (ref 3.5–5.1)
Sodium: 132 mmol/L — ABNORMAL LOW (ref 135–145)
Total Bilirubin: 1 mg/dL (ref 0.3–1.2)
Total Protein: 7.6 g/dL (ref 6.5–8.1)

## 2019-05-15 LAB — CBC WITH DIFFERENTIAL/PLATELET
Abs Immature Granulocytes: 0.05 10*3/uL (ref 0.00–0.07)
Basophils Absolute: 0 10*3/uL (ref 0.0–0.1)
Basophils Relative: 0 %
Eosinophils Absolute: 0 10*3/uL (ref 0.0–0.5)
Eosinophils Relative: 0 %
HCT: 42.4 % (ref 39.0–52.0)
Hemoglobin: 14.7 g/dL (ref 13.0–17.0)
Immature Granulocytes: 0 %
Lymphocytes Relative: 5 %
Lymphs Abs: 0.7 10*3/uL (ref 0.7–4.0)
MCH: 30.6 pg (ref 26.0–34.0)
MCHC: 34.7 g/dL (ref 30.0–36.0)
MCV: 88.3 fL (ref 80.0–100.0)
Monocytes Absolute: 1.2 10*3/uL — ABNORMAL HIGH (ref 0.1–1.0)
Monocytes Relative: 9 %
Neutro Abs: 10.9 10*3/uL — ABNORMAL HIGH (ref 1.7–7.7)
Neutrophils Relative %: 86 %
Platelets: 172 10*3/uL (ref 150–400)
RBC: 4.8 MIL/uL (ref 4.22–5.81)
RDW: 12.4 % (ref 11.5–15.5)
WBC: 12.8 10*3/uL — ABNORMAL HIGH (ref 4.0–10.5)
nRBC: 0 % (ref 0.0–0.2)

## 2019-05-15 LAB — LIPASE, BLOOD: Lipase: 29 U/L (ref 11–51)

## 2019-05-15 LAB — C DIFFICILE QUICK SCREEN W PCR REFLEX
C Diff antigen: NEGATIVE
C Diff interpretation: NOT DETECTED
C Diff toxin: NEGATIVE

## 2019-05-15 MED ORDER — ONDANSETRON 4 MG PO TBDP: 4.0000 mg | ORAL_TABLET | Freq: Three times a day (TID) | ORAL | 0 refills | Status: DC | PRN

## 2019-05-15 MED ORDER — DICYCLOMINE HCL 10 MG PO CAPS
20.0000 mg | ORAL_CAPSULE | Freq: Once | ORAL | Status: AC
  Administered 2019-05-15: 20 mg via ORAL
  Filled 2019-05-15: qty 2

## 2019-05-15 MED ORDER — IOHEXOL 300 MG/ML  SOLN
100.0000 mL | Freq: Once | INTRAMUSCULAR | Status: AC | PRN
  Administered 2019-05-15: 100 mL via INTRAVENOUS

## 2019-05-15 MED ORDER — ONDANSETRON HCL 4 MG/2ML IJ SOLN
4.0000 mg | Freq: Once | INTRAMUSCULAR | Status: AC
  Administered 2019-05-15: 4 mg via INTRAVENOUS
  Filled 2019-05-15: qty 2

## 2019-05-15 NOTE — ED Triage Notes (Signed)
C/o abdominal pain onset this am. States he stopped at [facility] urgent care and was referred here for CT scan

## 2019-05-15 NOTE — ED Notes (Signed)
ED Provider at bedside. 

## 2019-05-15 NOTE — Discharge Instructions (Addendum)
Your CT scan showed no acute abnormalities of the abdomen.  Considering the Bentyl improved your symptoms I suspect that this is likely gastroenteritis that will improve with time, fluids, Bentyl and Zofran.  I have prescribed you Zofran for nausea in case you begin to experience this.  It is good to have this on hand.  Do not need to take it if you not feel nauseous.  Please eat bland foods and rice.   If you continue to have symptoms greater than 1 week I recommend following up with gastroenterology.  Please call the number that I have provided you to make this appointment.  Generally found to have a small hiatal hernia on CT scan.  You may discuss this with your primary care doctor on follow-up.

## 2019-05-15 NOTE — ED Provider Notes (Signed)
[facility] EMERGENCY DEPARTMENT Provider Note   CSN: [id number] Arrival date & time: 05/15/19  1104     History Chief Complaint  Patient presents with  . Abdominal Pain    Charles Beltran is a 69 y.o. male.  HPI Patient is a 69 year old male with past medical history significant for hyperlipidemia, hypertension, prediabetes, obesity, reflux  Patient states that for the past 3 weeks he has had watery diarrhea.  Patient states he has a history of constipation/diarrhea however he states this is been purely diarrhea.  He states that it is watery and associated with cramping that is occasional.  He does state that since last night he has had much worsening, severe, constant, waxing and waning abdominal pain.  He states that it is lower abdomen worse on the right side.  He denies any history of abdominal surgeries.  States that he continues to pass flatus and stool.  No blood in his stool.   Patient denies any fever, chills, fatigue, cough, constipation, nausea, vomiting, body aches.  Denies any urinary frequency, urgency, hematuria or dysuria.  Patient states that he has not noticed any worsening of his symptoms with eating.  He states that he is only had some soup since the pain started.      Past Medical History:  Diagnosis Date  . Childhood asthma   . Hyperlipidemia   . Hypertension     Patient Active Problem List   Diagnosis Date Noted  . GERD (gastroesophageal reflux disease) 07/26/2017  . Prediabetes 07/26/2016  . Overweight (BMI 25.0-29.9) 07/22/2015  . Vitamin D deficiency 10/05/2014  . Heel spur 03/08/2014  . HTN (hypertension) 05/01/2010  . Hyperlipidemia 05/01/2010    History reviewed. No pertinent surgical history.     Family History  Problem Relation Age of Onset  . Epilepsy Mother   . Cancer Father        throat  . Alcohol abuse Father   . Heart attack Brother   . Stroke Brother     Social History   Tobacco Use  . Smoking status: Never Smoker  .  Smokeless tobacco: Never Used  Substance Use Topics  . Alcohol use: No  . Drug use: No    Home Medications Prior to Admission medications   Medication Sig Start Date End Date Taking? Authorizing Provider  ASPIRIN 81 PO Take 1 tablet by mouth daily.    [provider]  atorvastatin (LIPITOR) 40 MG tablet TAKE 1 TABLET BY MOUTH EVERY DAY 03/10/19   Evelina Dun A, FNP  Cholecalciferol (VITAMIN D) 2000 UNITS tablet Take 2,000 Units by mouth daily.      [provider]  fish oil-omega-3 fatty acids 1000 MG capsule Take 1 g by mouth. One bid     [provider]  lisinopril-hydrochlorothiazide (ZESTORETIC) 20-12.5 MG tablet Take 2 tablets by mouth daily. 03/10/19   Sharion Balloon, FNP  omeprazole (PRILOSEC) 20 MG capsule TAKE 1 CAPSULE BY MOUTH EVERY DAY 03/10/19   Evelina Dun A, FNP  ondansetron (ZOFRAN ODT) 4 MG disintegrating tablet Take 1 tablet (4 mg total) by mouth every 8 (eight) hours as needed for nausea or vomiting. 05/15/19   Tedd Sias, PA    Allergies    Penicillins  Review of Systems   Review of Systems  Constitutional: Negative for chills and fever.  HENT: Negative for congestion and ear pain.   Eyes: Negative for pain.  Respiratory: Negative for cough and shortness of breath.   Cardiovascular:  Negative for chest pain.  Gastrointestinal: Positive for abdominal pain. Negative for blood in stool, constipation, diarrhea, nausea and vomiting.  Endocrine: Negative for polydipsia and polyuria.  Genitourinary: Negative for difficulty urinating, dysuria and hematuria.  Musculoskeletal: Negative for myalgias.  Neurological: Negative for dizziness and headaches.    Physical Exam Updated Vital Signs BP (!) 150/68 (BP Location: Right Arm)   Pulse 67   Temp 97.9 F (36.6 C)   Resp 12   Ht 5\' 9"  (1.753 m)   Wt 86.2 kg   SpO2 100%   BMI 28.06 kg/m   Physical Exam Vitals and nursing note reviewed.  Constitutional:      General: He is not in  acute distress.    Comments: Patient is pleasant, 69 year old male in no acute distress sitting comfortably in bed.  Able answer questions appropriately follow commands.  HENT:     Head: Normocephalic and atraumatic.     Nose: Nose normal.     Mouth/Throat:     Mouth: Mucous membranes are moist.  Eyes:     General: No scleral icterus.    Extraocular Movements: Extraocular movements intact.     Pupils: Pupils are equal, round, and reactive to light.  Cardiovascular:     Rate and Rhythm: Normal rate and regular rhythm.     Pulses: Normal pulses.     Heart sounds: Normal heart sounds.  Pulmonary:     Effort: Pulmonary effort is normal. No respiratory distress.     Breath sounds: No wheezing.  Abdominal:     Palpations: Abdomen is soft.     Tenderness: There is abdominal tenderness in the right lower quadrant. There is rebound. There is no right CVA tenderness, left CVA tenderness or guarding.     Hernia: No hernia is present.     Comments: Patient with tenderness to palpation of the right lower quadrant.  Mild rebound.  Negative Rovsing, negative psoas.  Negative Murphy.  Musculoskeletal:     Cervical back: Normal range of motion.     Right lower leg: No edema.     Left lower leg: No edema.  Skin:    General: Skin is warm and dry.     Capillary Refill: Capillary refill takes less than 2 seconds.  Neurological:     Mental Status: He is alert and oriented to person, place, and time. Mental status is at baseline.  Psychiatric:        Mood and Affect: Mood normal.        Behavior: Behavior normal.     ED Results / Procedures / Treatments   Labs (all labs ordered are listed, but only abnormal results are displayed) Labs Reviewed  CBC WITH DIFFERENTIAL/PLATELET - Abnormal; Notable for the following components:      Result Value   WBC 12.8 (*)    Neutro Abs 10.9 (*)    Monocytes Absolute 1.2 (*)    All other components within normal limits  COMPREHENSIVE METABOLIC PANEL -  Abnormal; Notable for the following components:   Sodium 132 (*)    Chloride 97 (*)    Glucose, Bld 128 (*)    All other components within normal limits  C DIFFICILE QUICK SCREEN W PCR REFLEX  GASTROINTESTINAL PANEL BY PCR, STOOL (REPLACES STOOL CULTURE)  LIPASE, BLOOD    EKG None  Radiology CT ABDOMEN PELVIS W CONTRAST  Result Date: 05/15/2019 CLINICAL DATA:  RIGHT lower quadrant abdominal pain with symptom onset this morning, diarrhea, appendicitis versus enteritis since;  history asthma, hypertension EXAM: CT ABDOMEN AND PELVIS WITH CONTRAST TECHNIQUE: Multidetector CT imaging of the abdomen and pelvis was performed using the standard protocol following bolus administration of intravenous contrast. Sagittal and coronal MPR images reconstructed from axial data set. CONTRAST:  OMNIPAQUE IOHEXOL 300 MG/ML SOLN IV. No oral contrast. COMPARISON:  None FINDINGS: Lower chest: Lung bases clear Hepatobiliary: Gallbladder and liver normal appearance Pancreas: Atrophic pancreas without mass Spleen: Normal appearance.  Small splenule adjacent to spleen. Adrenals/Urinary Tract: Adrenal glands, kidneys, ureters, bladder normal appearance Stomach/Bowel: Normal appendix. Small hiatal hernia. Stomach and bowel loops otherwise normal appearance. Vascular/Lymphatic: Atherosclerotic calcifications aorta and iliac arteries without aneurysm. Minimal coronary arterial calcification. No adenopathy. Reproductive: Prostatic enlargement with gland measuring 5.3 x 4.4 x 4.3 cm Other: No free air or free fluid. No definite hernia or inflammatory process Musculoskeletal: No acute osseous findings. IMPRESSION: Prostatic enlargement. Small hiatal hernia. No acute intra-abdominal or intrapelvic abnormalities. Electronically Signed   By: Ulyses Southward M.D.   On: 05/15/2019 14:55    Procedures Procedures (including critical care time)  Medications Ordered in ED Medications  dicyclomine (BENTYL) capsule 20 mg (has no  administration in time range)  ondansetron (ZOFRAN) injection 4 mg (4 mg Intravenous Given 05/15/19 1256)  dicyclomine (BENTYL) capsule 20 mg (20 mg Oral Given 05/15/19 1300)  iohexol (OMNIPAQUE) 300 MG/ML solution 100 mL (100 mLs Intravenous Contrast Given 05/15/19 1436)    ED Course  I have reviewed the triage vital signs and the nursing notes.  Pertinent labs & imaging results that were available during my care of the patient were reviewed by me and considered in my medical decision making (see chart for details).  Clinical Course as of May 15 1607  Fri May 15, 2019  1246 CBC with mild nonspecific leukocytosis with mild left shift.  CBC with Differential/Platelet(!) [WF]  1341 CMP with mild hyponatremia is likely secondary to increased water intake which patient states that he has been taking frequently.  He also has diarrhea which could be accounting for his low sodium and chloride.  Potassium is within normal limits.   [WF]  1341 Lipase within normal limits.  C. difficile PCR, GI panel PCR pending.   [WF]  1341 VS within normal and was performed.  Mild hypertension.   [WF]    Clinical Course User Index [WF] Gailen Shelter, [place]   CT scan independently viewed by myself.  There is no acute abnormality.  Incidental hiatal hernia and prostatic enlargement found.  This is not unremarkable considering patient's age.  Vital signs are within normal limits.  Patient states that his pain is completely limited after Bentyl.  Will discharge home with Bentyl and Zofran.  Patient instructed on precautions.  Repeat abdominal exam is without acute abnormality.  No tenderness to palpation.  MDM Rules/Calculators/A&P                      I discussed this case with my attending physician who cosigned this note including patient's presenting symptoms, physical exam, and planned diagnostics and interventions. Attending physician stated agreement with plan or made changes to plan which were implemented.    Attending physician assessed patient at bedside.  Patient discharged with Bentyl and Zofran in case he experiences nausea.  Final Clinical Impression(s) / ED Diagnoses Final diagnoses:  Generalized abdominal pain  Hiatal hernia    Rx / DC Orders ED Discharge Orders         Ordered  ondansetron (ZOFRAN ODT) 4 MG disintegrating tablet  Every 8 hours PRN     05/15/19 1609           Gailen Shelter, [place] 05/15/19 1610    Bethann Berkshire, MD 05/16/19 [phone]

## 2019-05-16 LAB — GASTROINTESTINAL PANEL BY PCR, STOOL (REPLACES STOOL CULTURE)

## 2019-07-27 ENCOUNTER — Ambulatory Visit (INDEPENDENT_AMBULATORY_CARE_PROVIDER_SITE_OTHER): Payer: PPO | Admitting: *Deleted

## 2019-07-27 DIAGNOSIS — Z0000 Encounter for general adult medical examination without abnormal findings: Secondary | ICD-10-CM

## 2019-07-27 NOTE — Progress Notes (Addendum)
MEDICARE ANNUAL WELLNESS VISIT  07/27/2019  Telephone Visit Disclaimer This Medicare AWV was conducted by telephone due to national recommendations for restrictions regarding the COVID-19 Pandemic (e.g. social distancing).  I verified, using two identifiers, that I am speaking with Charles Beltran or their authorized healthcare agent. I discussed the limitations, risks, security, and privacy concerns of performing an evaluation and management service by telephone and the potential availability of an in-person appointment in the future. The patient expressed understanding and agreed to proceed.   Patient:  Home  Provider:  [facility]   Subjective:  Charles Beltran is a 69 y.o. male patient of Hawks, Edilia Bo, FNP who had a Medicare Annual Wellness Visit today via telephone. [name] is Retired and lives alone. he has 0 children. he reports that he is socially active and does interact with friends/family regularly. he is minimally physically active and enjoys working outside.  Patient Care Team: Junie Spencer, FNP as PCP - General (Family Medicine)  Advanced Directives 07/27/2019 05/15/2019 07/23/2018 03/08/2014  Does Patient Have a Medical Advance Directive? No No No No  Would patient like information on creating a medical advance directive? No - Patient declined - No - Patient declined Yes - Educational materials given    Hospital Utilization Over the Past 12 Months: # of hospitalizations or ER visits: 0 # of surgeries: 1  Review of Systems    Patient reports that his overall health is unchanged compared to last year.  History obtained from chart review  Patient Reported Readings (BP, Pulse, CBG, Weight, etc) none  Pain Assessment Pain : No/denies pain     Current Medications & Allergies (verified) Allergies as of 07/27/2019       Reactions   Penicillins Shortness Of Breath        Medication List        Accurate as of July 27, 2019 10:38 AM. If you have any questions, ask  your nurse or doctor.          STOP taking these medications    ondansetron 4 MG disintegrating tablet Commonly known as: Zofran ODT       TAKE these medications    ASPIRIN 81 PO Take 1 tablet by mouth daily.   atorvastatin 40 MG tablet Commonly known as: LIPITOR TAKE 1 TABLET BY MOUTH EVERY DAY   fish oil-omega-3 fatty acids 1000 MG capsule Take 1 g by mouth. One bid   lisinopril-hydrochlorothiazide 20-12.5 MG tablet Commonly known as: ZESTORETIC Take 2 tablets by mouth daily.   omeprazole 20 MG capsule Commonly known as: PRILOSEC TAKE 1 CAPSULE BY MOUTH EVERY DAY   Vitamin D 50 MCG (2000 UT) tablet Take 2,000 Units by mouth daily.        History (reviewed): Past Medical History:  Diagnosis Date  . Childhood asthma   . Hyperlipidemia   . Hypertension    History reviewed. No pertinent surgical history. Family History  Problem Relation Age of Onset  . Epilepsy Mother   . Cancer Father        throat  . Alcohol abuse Father   . Heart attack Brother   . Stroke Brother    Social History   Socioeconomic History  . Marital status: Single    Spouse name: Not on file  . Number of children: 0  . Years of education: 16  . Highest education level: Bachelor's degree (e.g., BA, AB, BS)  Occupational History  . Occupation: Retired  Tobacco Use  .  Smoking status: Never Smoker  . Smokeless tobacco: Never Used  Vaping Use  . Vaping Use: Never used  Substance and Sexual Activity  . Alcohol use: No  . Drug use: No  . Sexual activity: Not Currently  Other Topics Concern  . Not on file  Social History Narrative  . Not on file   Social Determinants of Health   Financial Resource Strain: Low Risk   . Difficulty of Paying Living Expenses: Not hard at all  Food Insecurity: No Food Insecurity  . Worried About Programme researcher, broadcasting/film/video in the Last Year: Never true  . Ran Out of Food in the Last Year: Never true  Transportation Needs: No Transportation Needs  .  Lack of Transportation (Medical): No  . Lack of Transportation (Non-Medical): No  Physical Activity: Inactive  . Days of Exercise per Week: 0 days  . Minutes of Exercise per Session: 0 min  Stress: No Stress Concern Present  . Feeling of Stress : Not at all  Social Connections: Socially Isolated  . Frequency of Communication with Friends and Family: More than three times a week  . Frequency of Social Gatherings with Friends and Family: More than three times a week  . Attends Religious Services: Never  . Active Member of Clubs or Organizations: No  . Attends Banker Meetings: Never  . Marital Status: Never married    Activities of Daily Living In your present state of health, do you have any difficulty performing the following activities: 07/27/2019  Hearing? N  Vision? N  Difficulty concentrating or making decisions? N  Walking or climbing stairs? N  Dressing or bathing? N  Doing errands, shopping? N  Preparing Food and eating ? N  Using the Toilet? N  In the past six months, have you accidently leaked urine? N  Do you have problems with loss of bowel control? N  Managing your Medications? N  Managing your Finances? N  Housekeeping or managing your Housekeeping? N  Some recent data might be hidden    Patient Education/ Literacy How often do you need to have someone help you when you read instructions, pamphlets, or other written materials from your doctor or pharmacy?: 1 - Never What is the last grade level you completed in school?: Bachelors Degree  Exercise Current Exercise Habits: The patient does not participate in regular exercise at present, Exercise limited by: None identified  Diet Patient reports consuming 3 meals a day and 1 snack(s) a day Patient reports that his primary diet is: Regular Patient reports that she does have regular access to food.   Depression Screen PHQ 2/9 Scores 07/27/2019 03/10/2019 08/05/2018 07/23/2018 01/30/2018 09/23/2017  08/27/2017  PHQ - 2 Score 0 0 0 0 0 0 0     Fall Risk Fall Risk  07/27/2019 03/10/2019 08/05/2018 07/23/2018 01/30/2018  Falls in the past year? 0 0 0 0 0  Number falls in past yr: - - - - -  Injury with Fall? - - - - -     Objective:  Charles Beltran seemed alert and oriented and he participated appropriately during our telephone visit.  Blood Pressure Weight BMI  BP Readings from Last 3 Encounters:  05/15/19 (!) 150/68  03/10/19 139/71  08/05/18 (!) 157/78   Wt Readings from Last 3 Encounters:  05/15/19 190 lb (86.2 kg)  03/10/19 195 lb (88.5 kg)  08/05/18 195 lb 9.6 oz (88.7 kg)   BMI Readings from Last 1 Encounters:  05/15/19  28.06 kg/m    *Unable to obtain current vital signs, weight, and BMI due to telephone visit type  Hearing/Vision  . Francisco did not seem to have difficulty with hearing/understanding during the telephone conversation . Reports that he has not had a formal eye exam by an eye care professional within the past year . Reports that he has not had a formal hearing evaluation within the past year *Unable to fully assess hearing and vision during telephone visit type  Cognitive Function: 6CIT Screen 07/27/2019 07/23/2018  What Year? 0 points 0 points  What month? 0 points 0 points  What time? 0 points 0 points  Count back from 20 0 points 0 points  Months in reverse 0 points 0 points  Repeat phrase 0 points 0 points  Total Score 0 0   (Normal:0-7, Significant for Dysfunction: >8)  Normal Cognitive Function Screening: Yes   Immunization & Health Maintenance Record Immunization History  Administered Date(s) Administered  . DTaP 02/05/2001  . Influenza Whole 10/26/2009  . Influenza, High Dose Seasonal PF 10/04/2016, 10/21/2018  . Influenza,inj,Quad PF,6+ Mos 10/31/2012, 11/03/2013, 09/19/2015  . Influenza-Unspecified 10/27/2014  . Moderna SARS-COVID-2 Vaccination 05/12/2019, 06/09/2019  . Pneumococcal Conjugate-13 02/10/2013  . Pneumococcal  Polysaccharide-23 01/24/2017  . Tdap 12/14/2011  . Zoster 08/18/2015    Health Maintenance  Topic Date Due  . COLON CANCER SCREENING ANNUAL FOBT  06/18/2015  . INFLUENZA VACCINE  09/06/2019  . TETANUS/TDAP  12/13/2021  . COLONOSCOPY  08/15/2023  . COVID-19 Vaccine  Completed  . Hepatitis C Screening  Completed  . PNA vac Low Risk Adult  Completed       Assessment  This is a routine wellness examination for Charles Beltran.  Health Maintenance: Due or Overdue Health Maintenance Due  Topic Date Due  . COLON CANCER SCREENING ANNUAL FOBT  06/18/2015    Charles Beltran does not need a referral for Community Assistance: Care Management:   no Social Work:    no Prescription Assistance:  no Nutrition/Diabetes Education:  no   Plan:  Personalized Goals Goals Addressed             This Visit's Progress   . DIET - INCREASE WATER INTAKE       Try to drink 6-8 glasses of water daily       Personalized Health Maintenance & Screening Recommendations  Colorectal cancer screening Shingrix vaccine  Lung Cancer Screening Recommended: no (Low Dose CT Chest recommended if Age 61-80 years, 30 pack-year currently smoking OR have quit w/in past 15 years) Hepatitis C Screening recommended: no HIV Screening recommended: no  Advanced Directives: Written information was not prepared per patient's request.  Referrals & Orders No orders of the defined types were placed in this encounter.   Follow-up Plan . Follow-up with Junie Spencer, FNP as planned . Consider Shingrix vaccine at your next visit with your PCP . Will get FOBT at next visit with PCP   I have personally reviewed and noted the following in the patient's chart:   . Medical and social history . Use of alcohol, tobacco or illicit drugs  . Current medications and supplements . Functional ability and status . Nutritional status . Physical activity . Advanced directives . List of other  physicians . Hospitalizations, surgeries, and ER visits in previous 12 months . Vitals . Screenings to include cognitive, depression, and falls . Referrals and appointments  In addition, I have reviewed and discussed with Charles Beltran certain preventive  protocols, quality metrics, and best practice recommendations. A written personalized care plan for preventive services as well as general preventive health recommendations is available and can be mailed to the patient at his request.      Milas Hock, LPN  03/17/4707    I have reviewed and agree with the above AWV documentation.   Evelina Dun, FNP

## 2019-07-27 NOTE — Patient Instructions (Signed)

## 2019-08-11 ENCOUNTER — Encounter: Payer: PPO | Admitting: Family

## 2019-08-13 ENCOUNTER — Encounter: Payer: PPO | Admitting: Family

## 2019-08-20 ENCOUNTER — Telehealth: Payer: Self-pay | Admitting: Family

## 2019-08-20 ENCOUNTER — Encounter: Payer: Self-pay | Admitting: *Deleted

## 2019-08-20 NOTE — Telephone Encounter (Signed)
Pt states he has been taking stool softener for the past 4 days and has been able to finally have a BM but today he hasn't and feels as if he needs to go and is only straining. For the past few weeks he has alternated between loose stools and then straining. Offered appt with Montey Hora at 4:05 today but pt declined. He would like to know what he can get otc to help.

## 2019-08-20 NOTE — Telephone Encounter (Signed)
Miralax daily can help, you can continue the stool softeners also. Make sure to force fluids.

## 2019-08-27 ENCOUNTER — Telehealth: Payer: Self-pay | Admitting: Family

## 2019-08-27 NOTE — Telephone Encounter (Signed)
Pt called [facility] stating that someone has tried calling him from our office several times and doesn't know why. Pulled up pts chart and saw that he recently called [place] about being constipated and wanted advice. Explained this to pt and he said that he forgot all about it but is feeling some better now by drinking a lot more water. Reviewed Christys note with him about taking Miralax. Pt voiced understanding.

## 2019-10-15 ENCOUNTER — Encounter: Payer: PPO | Admitting: Family

## 2019-10-16 ENCOUNTER — Other Ambulatory Visit: Payer: Self-pay

## 2019-10-16 ENCOUNTER — Ambulatory Visit (INDEPENDENT_AMBULATORY_CARE_PROVIDER_SITE_OTHER): Payer: PPO | Admitting: Family

## 2019-10-16 ENCOUNTER — Encounter: Payer: Self-pay | Admitting: Family

## 2019-10-16 VITALS — BP 162/71 | HR 82 | Temp 98.3°F | Ht 69.0 in | Wt 194.2 lb

## 2019-10-16 DIAGNOSIS — K219 Gastro-esophageal reflux disease without esophagitis: Secondary | ICD-10-CM | POA: Diagnosis not present

## 2019-10-16 DIAGNOSIS — E785 Hyperlipidemia, unspecified: Secondary | ICD-10-CM

## 2019-10-16 DIAGNOSIS — Z0000 Encounter for general adult medical examination without abnormal findings: Secondary | ICD-10-CM

## 2019-10-16 DIAGNOSIS — Z1211 Encounter for screening for malignant neoplasm of colon: Secondary | ICD-10-CM

## 2019-10-16 DIAGNOSIS — E663 Overweight: Secondary | ICD-10-CM | POA: Diagnosis not present

## 2019-10-16 DIAGNOSIS — I10 Essential (primary) hypertension: Secondary | ICD-10-CM

## 2019-10-16 DIAGNOSIS — Z0001 Encounter for general adult medical examination with abnormal findings: Secondary | ICD-10-CM

## 2019-10-16 DIAGNOSIS — E559 Vitamin D deficiency, unspecified: Secondary | ICD-10-CM | POA: Diagnosis not present

## 2019-10-16 MED ORDER — LISINOPRIL-HYDROCHLOROTHIAZIDE 20-12.5 MG PO TABS: 2.0000 | ORAL_TABLET | Freq: Every day | ORAL | 4 refills | Status: DC

## 2019-10-16 MED ORDER — ATORVASTATIN CALCIUM 40 MG PO TABS: ORAL_TABLET | ORAL | 3 refills | Status: DC

## 2019-10-16 NOTE — Progress Notes (Addendum)
Subjective:    Patient ID: Charles Beltran, male    DOB: [birth year], 69 y.o.   MRN: [id number]  Chief Complaint  Patient presents with  . Annual Exam   PT presents to the office today for CPE.  Hypertension This is a chronic problem. The current episode started more than 1 year ago. The problem has been waxing and waning since onset. The problem is uncontrolled. Pertinent negatives include no malaise/fatigue, peripheral edema or shortness of breath. Risk factors for coronary artery disease include male gender. The current treatment provides moderate improvement.  Gastroesophageal Reflux He complains of belching and heartburn. This is a chronic problem. The current episode started more than 1 year ago. The problem occurs occasionally. The problem has been waxing and waning. Risk factors include obesity. He has tried a PPI for the symptoms. The treatment provided moderate relief.  Hyperlipidemia This is a chronic problem. The current episode started more than 1 year ago. The problem is controlled. Recent lipid tests were reviewed and are normal. Exacerbating diseases include obesity. Pertinent negatives include no shortness of breath. Current antihyperlipidemic treatment includes statins. The current treatment provides moderate improvement of lipids. Risk factors for coronary artery disease include male sex, hypertension and a sedentary lifestyle.      Review of Systems  Constitutional: Negative for malaise/fatigue.  Respiratory: Negative for shortness of breath.   Gastrointestinal: Positive for heartburn.  All other systems reviewed and are negative.  Family History  Problem Relation Age of Onset  . Epilepsy Mother   . Cancer Father        throat  . Alcohol abuse Father   . Heart attack Brother   . Stroke Brother    Social History   Socioeconomic History  . Marital status: Single    Spouse name: Not on file  . Number of children: 0  . Years of education: 16  . Highest education  level: Bachelor's degree (e.g., BA, AB, BS)  Occupational History  . Occupation: Retired  Tobacco Use  . Smoking status: Never Smoker  . Smokeless tobacco: Never Used  Vaping Use  . Vaping Use: Never used  Substance and Sexual Activity  . Alcohol use: No  . Drug use: No  . Sexual activity: Not Currently  Other Topics Concern  . Not on file  Social History Narrative  . Not on file   Social Determinants of Health   Financial Resource Strain: Low Risk   . Difficulty of Paying Living Expenses: Not hard at all  Food Insecurity: No Food Insecurity  . Worried About Charity fundraiser in the Last Year: Never true  . Ran Out of Food in the Last Year: Never true  Transportation Needs: No Transportation Needs  . Lack of Transportation (Medical): No  . Lack of Transportation (Non-Medical): No  Physical Activity: Inactive  . Days of Exercise per Week: 0 days  . Minutes of Exercise per Session: 0 min  Stress: No Stress Concern Present  . Feeling of Stress : Not at all  Social Connections: Socially Isolated  . Frequency of Communication with Friends and Family: More than three times a week  . Frequency of Social Gatherings with Friends and Family: More than three times a week  . Attends Religious Services: Never  . Active Member of Clubs or Organizations: No  . Attends Archivist Meetings: Never  . Marital Status: Never married       Objective:   Physical Exam Vitals reviewed.  Constitutional:      General: He is not in acute distress.    Appearance: He is well-developed.  HENT:     Head: Normocephalic.     Right Ear: Tympanic membrane normal.     Left Ear: Tympanic membrane normal.  Eyes:     General:        Right eye: No discharge.        Left eye: No discharge.     Pupils: Pupils are equal, round, and reactive to light.  Neck:     Thyroid: No thyromegaly.  Cardiovascular:     Rate and Rhythm: Normal rate and regular rhythm.     Heart sounds: Murmur heard.     Pulmonary:     Effort: Pulmonary effort is normal. No respiratory distress.     Breath sounds: Normal breath sounds. No wheezing.  Abdominal:     General: Bowel sounds are normal. There is no distension.     Palpations: Abdomen is soft.     Tenderness: There is no abdominal tenderness.  Genitourinary:    Penis: Normal.      Testes: Normal.     Prostate: Normal.  Musculoskeletal:        General: No tenderness. Normal range of motion.     Cervical back: Normal range of motion and neck supple.  Skin:    General: Skin is warm and dry.     Findings: No erythema or rash.  Neurological:     Mental Status: He is alert and oriented to person, place, and time.     Cranial Nerves: No cranial nerve deficit.     Deep Tendon Reflexes: Reflexes are normal and symmetric.  Psychiatric:        Behavior: Behavior normal.        Thought Content: Thought content normal.        Judgment: Judgment normal.     BP (!) 162/71   Pulse 82   Temp 98.3 F (36.8 C) (Temporal)   Ht '5\' 9"'  (1.753 m)   Wt 194 lb 3.2 oz (88.1 kg)   SpO2 96%   BMI 28.68 kg/m    EKG- Normal sinus rhythm     Assessment & Plan:  Charles Beltran comes in today with chief complaint of Annual Exam   Diagnosis and orders addressed:  1. Essential hypertension - lisinopril-hydrochlorothiazide (ZESTORETIC) 20-12.5 MG tablet; Take 2 tablets by mouth daily.  Dispense: 180 tablet; Refill: 4  2. Hyperlipidemia, unspecified hyperlipidemia type - atorvastatin (LIPITOR) 40 MG tablet; TAKE 1 TABLET BY MOUTH EVERY DAY  Dispense: 90 tablet; Refill: 3  3. Annual physical exam - Cologuard - CMP14+EGFR - CBC with Differential/Platelet - Lipid panel - PSA, total and free - TSH - EKG 12-Lead  4. Colon cancer screening - Cologuard  5. Gastroesophageal reflux disease, unspecified whether esophagitis present  6. Overweight (BMI 25.0-29.9)  7. Vitamin D deficiency - VITAMIN D 25 Hydroxy (Vit-D Deficiency, Fractures)   Labs  pending Health Maintenance reviewed Diet and exercise encouraged  Follow up plan: 6 months    Evelina Dun, FNP

## 2019-10-16 NOTE — Patient Instructions (Signed)
Health Maintenance After Age 68 After age 68, you are at a higher risk for certain long-term diseases and infections as well as injuries from falls. Falls are a major cause of broken bones and head injuries in people who are older than age 68. Getting regular preventive care can help to keep you healthy and well. Preventive care includes getting regular testing and making lifestyle changes as recommended by your health care provider. Talk with your health care provider about:  Which screenings and tests you should have. A screening is a test that checks for a disease when you have no symptoms.  A diet and exercise plan that is right for you. What should I know about screenings and tests to prevent falls? Screening and testing are the best ways to find a health problem early. Early diagnosis and treatment give you the best chance of managing medical conditions that are common after age 68. Certain conditions and lifestyle choices may make you more likely to have a fall. Your health care provider may recommend:  Regular vision checks. Poor vision and conditions such as cataracts can make you more likely to have a fall. If you wear glasses, make sure to get your prescription updated if your vision changes.  Medicine review. Work with your health care provider to regularly review all of the medicines you are taking, including over-the-counter medicines. Ask your health care provider about any side effects that may make you more likely to have a fall. Tell your health care provider if any medicines that you take make you feel dizzy or sleepy.  Osteoporosis screening. Osteoporosis is a condition that causes the bones to get weaker. This can make the bones weak and cause them to break more easily.  Blood pressure screening. Blood pressure changes and medicines to control blood pressure can make you feel dizzy.  Strength and balance checks. Your health care provider may recommend certain tests to check your  strength and balance while standing, walking, or changing positions.  Foot health exam. Foot pain and numbness, as well as not wearing proper footwear, can make you more likely to have a fall.  Depression screening. You may be more likely to have a fall if you have a fear of falling, feel emotionally low, or feel unable to do activities that you used to do.  Alcohol use screening. Using too much alcohol can affect your balance and may make you more likely to have a fall. What actions can I take to lower my risk of falls? General instructions  Talk with your health care provider about your risks for falling. Tell your health care provider if: ? You fall. Be sure to tell your health care provider about all falls, even ones that seem minor. ? You feel dizzy, sleepy, or off-balance.  Take over-the-counter and prescription medicines only as told by your health care provider. These include any supplements.  Eat a healthy diet and maintain a healthy weight. A healthy diet includes low-fat dairy products, low-fat (lean) meats, and fiber from whole grains, beans, and lots of fruits and vegetables. Home safety  Remove any tripping hazards, such as rugs, cords, and clutter.  Install safety equipment such as grab bars in bathrooms and safety rails on stairs.  Keep rooms and walkways well-lit. Activity   Follow a regular exercise program to stay fit. This will help you maintain your balance. Ask your health care provider what types of exercise are appropriate for you.  If you need a cane or   walker, use it as recommended by your health care provider.  Wear supportive shoes that have nonskid soles. Lifestyle  Do not drink alcohol if your health care provider tells you not to drink.  If you drink alcohol, limit how much you have: ? 0-1 drink a day for women. ? 0-2 drinks a day for men.  Be aware of how much alcohol is in your drink. In the [place] one drink equals one typical bottle of beer (12  oz), one-half glass of wine (5 oz), or one shot of hard liquor (1 oz).  Do not use any products that contain nicotine or tobacco, such as cigarettes and e-cigarettes. If you need help quitting, ask your health care provider. Summary  Having a healthy lifestyle and getting preventive care can help to protect your health and wellness after age 68.  Screening and testing are the best way to find a health problem early and help you avoid having a fall. Early diagnosis and treatment give you the best chance for managing medical conditions that are more common for people who are older than age 68.  Falls are a major cause of broken bones and head injuries in people who are older than age 68. Take precautions to prevent a fall at home.  Work with your health care provider to learn what changes you can make to improve your health and wellness and to prevent falls. This information is not intended to replace advice given to you by your health care provider. Make sure you discuss any questions you have with your health care provider. Document Revised: 05/15/2018 Document Reviewed: 12/05/2016 Elsevier Patient Education  2020 Elsevier Inc.  

## 2019-10-17 LAB — CMP14+EGFR
ALT: 34 IU/L (ref 0–44)
AST: 25 IU/L (ref 0–40)
Albumin/Globulin Ratio: 2 (ref 1.2–2.2)
Albumin: 4.6 g/dL (ref 3.8–4.8)
Alkaline Phosphatase: 69 IU/L (ref 48–121)
BUN/Creatinine Ratio: 20 (ref 10–24)
BUN: 19 mg/dL (ref 8–27)
Bilirubin Total: 0.5 mg/dL (ref 0.0–1.2)
CO2: 22 mmol/L (ref 20–29)
Calcium: 9.6 mg/dL (ref 8.6–10.2)
Chloride: 96 mmol/L (ref 96–106)
Creatinine, Ser: 0.96 mg/dL (ref 0.76–1.27)
GFR calc Af Amer: 94 mL/min/{1.73_m2} (ref >59)
GFR calc non Af Amer: 81 mL/min/{1.73_m2} (ref >59)
Globulin, Total: 2.3 g/dL (ref 1.5–4.5)
Glucose: 132 mg/dL — ABNORMAL HIGH (ref 65–99)
Potassium: 4 mmol/L (ref 3.5–5.2)
Sodium: 132 mmol/L — ABNORMAL LOW (ref 134–144)
Total Protein: 6.9 g/dL (ref 6.0–8.5)

## 2019-10-17 LAB — CBC WITH DIFFERENTIAL/PLATELET
Basophils Absolute: 0 10*3/uL (ref 0.0–0.2)
Basos: 1 %
EOS (ABSOLUTE): 0.3 10*3/uL (ref 0.0–0.4)
Eos: 4 %
Hematocrit: 45.7 % (ref 37.5–51.0)
Hemoglobin: 15.4 g/dL (ref 13.0–17.7)
Immature Grans (Abs): 0 10*3/uL (ref 0.0–0.1)
Immature Granulocytes: 1 %
Lymphocytes Absolute: 1.1 10*3/uL (ref 0.7–3.1)
Lymphs: 17 %
MCH: 29.4 pg (ref 26.6–33.0)
MCHC: 33.7 g/dL (ref 31.5–35.7)
MCV: 87 fL (ref 79–97)
Monocytes Absolute: 1 10*3/uL — ABNORMAL HIGH (ref 0.1–0.9)
Monocytes: 15 %
Neutrophils Absolute: 4.1 10*3/uL (ref 1.4–7.0)
Neutrophils: 62 %
Platelets: 186 10*3/uL (ref 150–450)
RBC: 5.23 x10E6/uL (ref 4.14–5.80)
RDW: 12.8 % (ref 11.6–15.4)
WBC: 6.6 10*3/uL (ref 3.4–10.8)

## 2019-10-17 LAB — PSA, TOTAL AND FREE
PSA, Free Pct: 30.8 %
PSA, Free: 0.37 ng/mL
Prostate Specific Ag, Serum: 1.2 ng/mL (ref 0.0–4.0)

## 2019-10-17 LAB — TSH: TSH: 2.2 u[IU]/mL (ref 0.450–4.500)

## 2019-10-17 LAB — LIPID PANEL
Chol/HDL Ratio: 2.8 ratio (ref 0.0–5.0)
Cholesterol, Total: 129 mg/dL (ref 100–199)
HDL: 46 mg/dL (ref >39)
LDL Chol Calc (NIH): 67 mg/dL (ref 0–99)
Triglycerides: 81 mg/dL (ref 0–149)
VLDL Cholesterol Cal: 16 mg/dL (ref 5–40)

## 2019-10-17 LAB — VITAMIN D 25 HYDROXY (VIT D DEFICIENCY, FRACTURES): Vit D, 25-Hydroxy: 36.5 ng/mL (ref 30.0–100.0)

## 2019-10-20 ENCOUNTER — Telehealth: Payer: Self-pay | Admitting: Family

## 2019-10-21 NOTE — Telephone Encounter (Signed)
Pt aware of labs  

## 2020-03-13 ENCOUNTER — Other Ambulatory Visit: Payer: Self-pay | Admitting: Family

## 2020-03-13 DIAGNOSIS — K219 Gastro-esophageal reflux disease without esophagitis: Secondary | ICD-10-CM

## 2020-03-15 ENCOUNTER — Other Ambulatory Visit: Payer: Self-pay | Admitting: Family

## 2020-03-15 DIAGNOSIS — K219 Gastro-esophageal reflux disease without esophagitis: Secondary | ICD-10-CM

## 2020-03-30 ENCOUNTER — Ambulatory Visit: Payer: PPO | Admitting: Nurse Practitioner

## 2020-03-30 ENCOUNTER — Other Ambulatory Visit: Payer: Self-pay

## 2020-03-30 DIAGNOSIS — R1110 Vomiting, unspecified: Secondary | ICD-10-CM | POA: Diagnosis not present

## 2020-03-30 DIAGNOSIS — K529 Noninfective gastroenteritis and colitis, unspecified: Secondary | ICD-10-CM | POA: Diagnosis not present

## 2020-03-30 DIAGNOSIS — R1084 Generalized abdominal pain: Secondary | ICD-10-CM | POA: Diagnosis not present

## 2020-03-30 DIAGNOSIS — R059 Cough, unspecified: Secondary | ICD-10-CM | POA: Diagnosis not present

## 2020-04-14 ENCOUNTER — Encounter: Payer: Self-pay | Admitting: Family

## 2020-04-14 ENCOUNTER — Other Ambulatory Visit: Payer: Self-pay

## 2020-04-14 ENCOUNTER — Ambulatory Visit (INDEPENDENT_AMBULATORY_CARE_PROVIDER_SITE_OTHER): Payer: PPO | Admitting: Family

## 2020-04-14 VITALS — BP 142/64 | HR 91 | Temp 98.2°F | Ht 69.0 in | Wt 191.6 lb

## 2020-04-14 DIAGNOSIS — R7303 Prediabetes: Secondary | ICD-10-CM

## 2020-04-14 DIAGNOSIS — K219 Gastro-esophageal reflux disease without esophagitis: Secondary | ICD-10-CM

## 2020-04-14 DIAGNOSIS — E663 Overweight: Secondary | ICD-10-CM

## 2020-04-14 DIAGNOSIS — I10 Essential (primary) hypertension: Secondary | ICD-10-CM | POA: Diagnosis not present

## 2020-04-14 DIAGNOSIS — H6123 Impacted cerumen, bilateral: Secondary | ICD-10-CM

## 2020-04-14 DIAGNOSIS — E785 Hyperlipidemia, unspecified: Secondary | ICD-10-CM

## 2020-04-14 DIAGNOSIS — Z1211 Encounter for screening for malignant neoplasm of colon: Secondary | ICD-10-CM

## 2020-04-14 LAB — CBC WITH DIFFERENTIAL/PLATELET
Basophils Absolute: 0 10*3/uL (ref 0.0–0.2)
Basos: 1 %
EOS (ABSOLUTE): 0.1 10*3/uL (ref 0.0–0.4)
Eos: 2 %
Hematocrit: 39.2 % (ref 37.5–51.0)
Hemoglobin: 13.6 g/dL (ref 13.0–17.7)
Immature Grans (Abs): 0 10*3/uL (ref 0.0–0.1)
Immature Granulocytes: 0 %
Lymphocytes Absolute: 1 10*3/uL (ref 0.7–3.1)
Lymphs: 17 %
MCH: 30.1 pg (ref 26.6–33.0)
MCHC: 34.7 g/dL (ref 31.5–35.7)
MCV: 87 fL (ref 79–97)
Monocytes Absolute: 0.7 10*3/uL (ref 0.1–0.9)
Monocytes: 11 %
Neutrophils Absolute: 4 10*3/uL (ref 1.4–7.0)
Neutrophils: 69 %
Platelets: 319 10*3/uL (ref 150–450)
RBC: 4.52 x10E6/uL (ref 4.14–5.80)
RDW: 11.8 % (ref 11.6–15.4)
WBC: 5.8 10*3/uL (ref 3.4–10.8)

## 2020-04-14 LAB — CMP14+EGFR
ALT: 86 IU/L — ABNORMAL HIGH (ref 0–44)
AST: 40 IU/L (ref 0–40)
Albumin/Globulin Ratio: 1.5 (ref 1.2–2.2)
Albumin: 4.2 g/dL (ref 3.8–4.8)
Alkaline Phosphatase: 83 IU/L (ref 44–121)
BUN/Creatinine Ratio: 18 (ref 10–24)
BUN: 19 mg/dL (ref 8–27)
Bilirubin Total: 0.8 mg/dL (ref 0.0–1.2)
CO2: 21 mmol/L (ref 20–29)
Calcium: 9.9 mg/dL (ref 8.6–10.2)
Chloride: 97 mmol/L (ref 96–106)
Creatinine, Ser: 1.06 mg/dL (ref 0.76–1.27)
Globulin, Total: 2.8 g/dL (ref 1.5–4.5)
Glucose: 184 mg/dL — ABNORMAL HIGH (ref 65–99)
Potassium: 5.2 mmol/L (ref 3.5–5.2)
Sodium: 133 mmol/L — ABNORMAL LOW (ref 134–144)
Total Protein: 7 g/dL (ref 6.0–8.5)
eGFR: 76 mL/min/{1.73_m2} (ref >59)

## 2020-04-14 LAB — BAYER DCA HB A1C WAIVED: HB A1C (BAYER DCA - WAIVED): 7.3 % — ABNORMAL HIGH (ref <7.0)

## 2020-04-14 MED ORDER — TRIAMCINOLONE ACETONIDE 0.5 % EX OINT: 1.0000 "application " | TOPICAL_OINTMENT | Freq: Two times a day (BID) | CUTANEOUS | 0 refills | Status: DC

## 2020-04-14 NOTE — Progress Notes (Addendum)
Subjective:    Patient ID: Charles Beltran, male    DOB: 08-28-1950, 70 y.o.   MRN: [id number]  Chief Complaint  Patient presents with  . Hypertension   PT presents to the office today for chronic follow up.  Hypertension This is a chronic problem. The current episode started more than 1 year ago. The problem has been resolved since onset. The problem is controlled. Pertinent negatives include no blurred vision, malaise/fatigue, peripheral edema or shortness of breath. Risk factors for coronary artery disease include dyslipidemia, obesity, male gender and sedentary lifestyle. The current treatment provides moderate improvement. There is no history of CVA or heart failure.  Gastroesophageal Reflux He complains of belching and heartburn. This is a chronic problem. The current episode started more than 1 year ago. The problem occurs occasionally. Risk factors include obesity. He has tried a PPI for the symptoms.  Hyperlipidemia This is a chronic problem. The current episode started more than 1 year ago. The problem is controlled. Recent lipid tests were reviewed and are normal. Exacerbating diseases include obesity. Pertinent negatives include no shortness of breath. Current antihyperlipidemic treatment includes statins. The current treatment provides moderate improvement of lipids. Risk factors for coronary artery disease include dyslipidemia, male sex, hypertension and a sedentary lifestyle.  Diabetes Pertinent negatives for diabetes include no blurred vision and no foot paresthesias. Symptoms are stable. Pertinent negatives for diabetic complications include no CVA. (Does not check blood glucose at home)      Review of Systems  Constitutional: Negative for malaise/fatigue.  Eyes: Negative for blurred vision.  Respiratory: Negative for shortness of breath.   Gastrointestinal: Positive for heartburn.  All other systems reviewed and are negative.      Objective:   Physical Exam Vitals  reviewed.  Constitutional:      General: He is not in acute distress.    Appearance: He is well-developed.  HENT:     Head: Normocephalic.     Right Ear: Tympanic membrane normal. There is impacted cerumen.     Left Ear: Tympanic membrane normal. There is impacted cerumen.  Eyes:     General:        Right eye: No discharge.        Left eye: No discharge.     Pupils: Pupils are equal, round, and reactive to light.  Neck:     Thyroid: No thyromegaly.  Cardiovascular:     Rate and Rhythm: Normal rate and regular rhythm.     Heart sounds: Murmur heard.    Pulmonary:     Effort: Pulmonary effort is normal. No respiratory distress.     Breath sounds: Normal breath sounds. No wheezing.  Abdominal:     General: Bowel sounds are normal. There is no distension.     Palpations: Abdomen is soft.     Tenderness: There is no abdominal tenderness.  Musculoskeletal:        General: No tenderness. Normal range of motion.     Cervical back: Normal range of motion and neck supple.  Skin:    General: Skin is warm and dry.     Findings: No erythema or rash.  Neurological:     Mental Status: He is alert and oriented to person, place, and time.     Cranial Nerves: No cranial nerve deficit.     Deep Tendon Reflexes: Reflexes are normal and symmetric.  Psychiatric:        Behavior: Behavior normal.  Thought Content: Thought content normal.        Judgment: Judgment normal.     Bilateral ears washed with warm water and peroxide. TM- WNL  BP (!) 142/64   Pulse 91   Temp 98.2 F (36.8 C) (Temporal)   Ht '5\' 9"'  (1.753 m)   Wt 191 lb 9.6 oz (86.9 kg)   BMI 28.29 kg/m      Assessment & Plan:  Charles Beltran comes in today with chief complaint of Hypertension   Diagnosis and orders addressed:  1. Primary hypertension - CMP14+EGFR - CBC with Differential/Platelet  2. Gastroesophageal reflux disease, unspecified whether esophagitis present - CMP14+EGFR - CBC with  Differential/Platelet  3. Hyperlipidemia, unspecified hyperlipidemia type - CMP14+EGFR - CBC with Differential/Platelet  4. Overweight (BMI 25.0-29.9) - CMP14+EGFR - CBC with Differential/Platelet  5. Prediabetes - CMP14+EGFR - CBC with Differential/Platelet - Bayer DCA Hb A1c Waived  6. Colon cancer screening - Cologuard - CMP14+EGFR - CBC with Differential/Platelet  7. Bilateral impacted cerumen   Labs pending Health Maintenance reviewed Diet and exercise encouraged  Follow up plan: 6 months    Charles Dun, FNP

## 2020-04-14 NOTE — Patient Instructions (Signed)
https://www.cancer.org/cancer/colon-rectal-cancer/causes-risks-prevention/risk-factors.html">  Colorectal Cancer Screening  Colorectal cancer screening is a group of tests that are used to check for colorectal cancer before symptoms develop. Colorectal refers to the colon and rectum. The colon and rectum are located at the end of the digestive tract and carry stool (feces) out of the body. Who should have screening? All adults who are 45-69 years old should have screening. Your health care provider may recommend screening before age 45. You will have tests every 1-10 years, depending on your results and the type of screening test. Screening recommendations for adults who are 76-85 years old vary depending on a person's health. People older than age 85 should no longer get colorectal cancer screening. You may have screening tests starting before age 45, or more often than other people, if you have any of these risk factors:  A personal or family history of colorectal cancer or abnormal growths known as polyps in your colon.  Inflammatory bowel disease, such as ulcerative colitis or Crohn's disease.  A history of having radiation treatment to the abdomen or the area between the hip bones (pelvic area) for cancer.  A type of genetic syndrome that is passed from parent to child (hereditary), such as: ? Arguijo syndrome. ? Familial adenomatous polyposis. ? Turcot syndrome. ? Peutz-Jeghers syndrome. ? MUTYH-associated polyposis (MAP).  A personal history of diabetes. Types of tests There are several types of colorectal screening tests. You may have one or more of the following:  Guaiac-based fecal occult blood testing. For this test, a stool sample is checked for hidden (occult) blood, which could be a sign of colorectal cancer.  Fecal immunochemical test (FIT). For this test, a stool sample is checked for blood, which could be a sign of colorectal cancer.  Stool DNA test. For this test, a stool  sample is checked for blood and changes in DNA that could lead to colorectal cancer.  Sigmoidoscopy. During this test, a thin, flexible tube with a camera on the end, called a sigmoidoscope, is used to examine the rectum and the lower colon.  Colonoscopy. During this test, a long, flexible tube with a camera on the end, called a colonoscope, is used to examine the entire colon and rectum. Also, sometimes a tissue sample is taken to be looked at under a microscope (biopsy) or small polyps are removed during this test.  Virtual colonoscopy. Instead of a colonoscope, this type of colonoscopy uses a CT scan to take pictures of the colon and rectum. A CT scan is a type of X-ray that is made using computers. What are the benefits of screening? Screening reduces your risk for colorectal cancer and can help identify cancer at an early stage, when the cancer can be removed or treated more easily. It is common for polyps to form in the lining of the colon, especially as you age. These polyps may be cancerous or become cancerous over time. Screening can identify these polyps. What are the risks of screening? Generally, these are safe tests. However, problems may occur, including:  The need for more tests to confirm results from a stool sample test. Stool sample tests have fewer risks than other types of screening tests.  Being exposed to low levels of radiation, if you had a test involving X-rays. This may slightly increase your cancer risk. The benefit of detecting cancer outweighs the slight increase in risk.  Bleeding, damage to the intestine, or infection caused by a sigmoidoscopy or colonoscopy.  A reaction to medicines given   during a sigmoidoscopy or colonoscopy. Talk with your health care provider to understand your risk for colorectal cancer and to make a screening plan that is right for you. Questions to ask your health care provider  When should I start colorectal cancer screening?  What is my  risk for colorectal cancer?  How often do I need screening?  Which screening tests do I need?  How do I get my test results?  What do my results mean? Where to find more information Learn more about colorectal cancer screening from:  The American Cancer Society: cancer.org  National Cancer Institute: cancer.gov Summary  Colorectal cancer screening is a group of tests used to check for colorectal cancer before symptoms develop.  All adults who are 45-69 years old should have screening. Your health care provider may recommend screening before age 45.  You may have screening tests starting before age 45, or more often than other people, if you have certain risk factors.  Screening reduces your risk for colorectal cancer and can help identify cancer at an early stage, when the cancer can be removed or treated more easily.  Talk with your health care provider to understand your risk for colorectal cancer and to make a screening plan that is right for you. This information is not intended to replace advice given to you by your health care provider. Make sure you discuss any questions you have with your health care provider. Document Revised: 05/13/2019 Document Reviewed: 05/13/2019 Elsevier Patient Education  2021 Elsevier Inc.  

## 2020-04-15 ENCOUNTER — Other Ambulatory Visit: Payer: Self-pay | Admitting: Family

## 2020-04-15 MED ORDER — METFORMIN HCL ER 500 MG PO TB24: 500.0000 mg | ORAL_TABLET | Freq: Every day | ORAL | 3 refills | Status: DC

## 2020-04-18 NOTE — Progress Notes (Signed)
R/c about labs 

## 2020-06-01 ENCOUNTER — Telehealth: Payer: Self-pay

## 2020-06-02 NOTE — Telephone Encounter (Signed)
Left detailed message.   

## 2020-06-02 NOTE — Telephone Encounter (Signed)
Yes, he should be taking a 81 mg aspirin daily.

## 2020-06-09 ENCOUNTER — Other Ambulatory Visit: Payer: Self-pay | Admitting: Family

## 2020-06-09 DIAGNOSIS — K219 Gastro-esophageal reflux disease without esophagitis: Secondary | ICD-10-CM

## 2020-06-14 ENCOUNTER — Other Ambulatory Visit: Payer: Self-pay

## 2020-06-14 ENCOUNTER — Ambulatory Visit (INDEPENDENT_AMBULATORY_CARE_PROVIDER_SITE_OTHER): Payer: PPO | Admitting: Family

## 2020-06-14 ENCOUNTER — Encounter: Payer: Self-pay | Admitting: Family

## 2020-06-14 VITALS — BP 140/61 | HR 79 | Temp 98.1°F | Ht 69.0 in | Wt 188.2 lb

## 2020-06-14 DIAGNOSIS — B070 Plantar wart: Secondary | ICD-10-CM

## 2020-06-14 NOTE — Progress Notes (Signed)
   Subjective:    Patient ID: Charles Beltran, male    DOB: 1950-04-03, 70 y.o.   MRN: [id number]  Chief Complaint  Patient presents with  . Sore    On the side of right foot x 1 week     HPI Pt presents to the office with a tender sore on right bottom foot he noticed about a week ago. He has not used anything on area.   He reports intermittent sharp pain of 5 out 10. Denies any discharge, fevers, no trauma, or foreign body that he is aware of.    Review of Systems  Skin:       Foot pain  All other systems reviewed and are negative.      Objective:   Physical Exam Vitals reviewed.  Constitutional:      General: He is not in acute distress.    Appearance: He is well-developed.  HENT:     Head: Normocephalic.  Eyes:     General:        Right eye: No discharge.        Left eye: No discharge.     Pupils: Pupils are equal, round, and reactive to light.  Neck:     Thyroid: No thyromegaly.  Cardiovascular:     Rate and Rhythm: Normal rate and regular rhythm.     Heart sounds: Murmur heard.    Pulmonary:     Effort: Pulmonary effort is normal. No respiratory distress.     Breath sounds: Normal breath sounds. No wheezing.  Abdominal:     General: Bowel sounds are normal. There is no distension.     Palpations: Abdomen is soft.     Tenderness: There is no abdominal tenderness.  Musculoskeletal:        General: No tenderness. Normal range of motion.     Cervical back: Normal range of motion and neck supple.       Feet:  Feet:     Comments: Small plantar wart Skin:    General: Skin is warm and dry.     Findings: No erythema or rash.  Neurological:     Mental Status: He is alert and oriented to person, place, and time.     Cranial Nerves: No cranial nerve deficit.     Deep Tendon Reflexes: Reflexes are normal and symmetric.  Psychiatric:        Behavior: Behavior normal.        Thought Content: Thought content normal.        Judgment: Judgment normal.     Cryotherapy used on ball of foot. Pt tolerated well.   BP 140/61   Pulse 79   Temp 98.1 F (36.7 C) (Temporal)   Ht 5\' 9"  (1.753 m)   Wt 188 lb 3.2 oz (85.4 kg)   BMI 27.79 kg/m       Assessment & Plan:  [name] comes in today with chief complaint of Sore (On the side of right foot x 1 week )   Diagnosis and orders addressed:  1. Plantar wart Area may blister Do not pick at  Report any changes erythemas, increased pain, or fevers.    [name], FNP

## 2020-06-14 NOTE — Patient Instructions (Signed)
Plantar Warts Warts are small growths on the skin. When they occur on the underside (sole) of the foot, they are called plantar warts. Plantar warts often occur in groups, with several small warts around a larger wart. They tend to develop on the heel or the ball of the foot. They may grow into the deeper layers of skin or rise above the surface of the skin. Most warts are not painful, and they usually do not cause problems. However, plantar warts may cause pain when you walk because pressure is applied to them. Plantar warts may spread to other areas of the sole. They can also spread to other areas of the body through direct and indirect contact. Warts often go away on their own in time. Various treatments may be done if needed or desired. What are the causes? Plantar warts are caused by a type of virus that is called human papillomavirus (HPV).  Walking barefoot can cause exposure to the virus, especially if your feet are wet.  HPV attacks a break in the skin of the foot. What increases the risk? You are more likely to develop this condition if you:  Are between 10-20 years of age.  Use public showers or locker rooms.  Have a weakened body defense system (immune system). What are the signs or symptoms? Common symptoms of this condition include:  Flat or slightly raised growths that have a rough surface and look similar to a callus.  Pain when you use your foot to support your body weight.   How is this diagnosed? A plantar wart can usually be diagnosed from its appearance. In some cases, a tissue sample may be removed (biopsy) to be looked at under a microscope. How is this treated? In many cases, warts do not need treatment. Without treatment, they often go away with time. If treatment is needed or desired, options may include:  Applying medicated solutions, creams, or patches to the wart. These may be over-the-counter or prescription medicines that make the skin soft so that layers  will gradually shed away. In many cases, the medicine is applied one or two times a day and covered with a bandage.  Freezing the wart with liquid nitrogen (cryotherapy).  Burning the wart with: ? Laser treatment. ? An electrified probe (electrocautery).  Injecting a medicine (Candida antigen) into the wart to help the body's immune system fight off the wart.  Having surgery to remove the wart.  Putting duct tape over the top of the wart (occlusion). You will leave the tape in place for as long as told by your health care provider, and then you will replace it with a new strip of tape. This is done until the wart goes away. Repeat treatment may be needed if you choose to remove warts. Warts sometimes go away and come back again. Follow these instructions at home:  Apply medicated creams or solutions only as told by your health care provider. This may involve: ? Soaking the affected area in warm water. ? Removing the top layer of softened skin before you apply the medicine. A pumice stone works well for removing the skin. ? Applying a bandage over the affected area after you apply the medicine. ? Repeating the process daily or as told by your health care provider.  Do not scratch or pick at a wart.  Wash your hands after you touch a wart.  If a wart is painful, try covering it with a bandage that has a hole in the middle.   This helps to take pressure off the wart.  Keep all follow-up visits as told by your health care provider. This is important. How is this prevented? Take these actions to help prevent warts:  Wear shoes and socks. Change your socks daily.  Keep your feet clean and dry.  Do not walk barefoot in shared locker rooms, shower areas, or swimming pools.  Check your feet regularly.  Avoid direct contact with warts on other people.   Contact a health care provider if:  Your warts do not improve after treatment.  You have redness, swelling, or pain at the site of a  wart.  You have bleeding from a wart that does not stop with light pressure.  You have diabetes and you develop a wart. Summary  Warts are small growths on the skin. When they occur on the underside (sole) of the foot, they are called plantar warts.  In many cases, warts do not need treatment. Without treatment, they often go away with time.  Apply medicated creams or solutions only as told by your health care provider.  Do not scratch or pick at a wart. Wash your hands after you touch a wart.  Keep all follow-up visits as told by your health care provider. This is important. This information is not intended to replace advice given to you by your health care provider. Make sure you discuss any questions you have with your health care provider. Document Revised: 08/20/2017 Document Reviewed: 08/20/2017 Elsevier Patient Education  2021 [employer]  

## 2020-07-12 ENCOUNTER — Encounter: Payer: Self-pay | Admitting: Nurse Practitioner

## 2020-07-12 ENCOUNTER — Ambulatory Visit (INDEPENDENT_AMBULATORY_CARE_PROVIDER_SITE_OTHER): Payer: PPO

## 2020-07-12 ENCOUNTER — Other Ambulatory Visit: Payer: Self-pay

## 2020-07-12 ENCOUNTER — Ambulatory Visit (INDEPENDENT_AMBULATORY_CARE_PROVIDER_SITE_OTHER): Payer: PPO | Admitting: Nurse Practitioner

## 2020-07-12 VITALS — BP 173/74 | HR 99 | Temp 98.2°F | Resp 20 | Ht 69.0 in | Wt 186.0 lb

## 2020-07-12 DIAGNOSIS — R1084 Generalized abdominal pain: Secondary | ICD-10-CM

## 2020-07-12 DIAGNOSIS — R109 Unspecified abdominal pain: Secondary | ICD-10-CM | POA: Diagnosis not present

## 2020-07-12 NOTE — Progress Notes (Signed)
   Subjective:    Patient ID: Charles Beltran, male    DOB: Jun 13, 1950, 70 y.o.   MRN: [id number]   Chief Complaint: Abdominal Pain and Plantars wart   HPI Abd pain x 1-1.5 years, has been seen at ED regarding pain with benign results, c/o constipation that alternates with diarrhea, is currently constipation and states last BM is still daily. Generalized abd pain that is more dull, denies sharp/shooting. States passing more gas x 2 days. States Prilosec daily but doesn't believe he drink enough water. Unsure if abd pain is related to eating.     Review of Systems  Constitutional: Negative.   HENT: Negative.   Eyes: Negative.   Respiratory: Negative.   Cardiovascular: Negative.   Gastrointestinal: Positive for abdominal pain and constipation. Negative for nausea and vomiting.  Endocrine: Negative.   Genitourinary: Negative for difficulty urinating, dysuria and urgency.  Musculoskeletal: Positive for arthralgias.       Mild lower lumbar pain  Skin: Positive for wound.       Planter wart  Allergic/Immunologic: Negative.   Neurological: Negative.   Hematological: Negative.   Psychiatric/Behavioral: Negative.   All other systems reviewed and are negative.      Objective:   Physical Exam Vitals and nursing note reviewed.  Constitutional:      Appearance: He is well-developed.  HENT:     Head: Normocephalic and atraumatic.     Mouth/Throat:     Mouth: Mucous membranes are moist.     Pharynx: Oropharynx is clear.  Eyes:     Pupils: Pupils are equal, round, and reactive to light.  Cardiovascular:     Rate and Rhythm: Normal rate and regular rhythm.     Heart sounds: Normal heart sounds.  Pulmonary:     Effort: Pulmonary effort is normal.     Breath sounds: Normal breath sounds.  Abdominal:     General: Abdomen is flat. Bowel sounds are decreased.     Palpations: Abdomen is soft. There is no mass.     Tenderness: There is abdominal tenderness in the periumbilical area.   Genitourinary:    Testes:        Right: Mass not present.        Left: Mass not present.  Skin:    General: Skin is warm and dry.     Capillary Refill: Capillary refill takes less than 2 seconds.  Neurological:     General: No focal deficit present.     Mental Status: He is alert and oriented to person, place, and time.  Psychiatric:        Mood and Affect: Mood normal.        Behavior: Behavior normal.   BP (!) 173/74   Pulse 99   Temp 98.2 F (36.8 C) (Temporal)   Resp 20   Ht 5\' 9"  (1.753 m)   Wt 186 lb (84.4 kg)   SpO2 94%   BMI 27.47 kg/m   kub- normal-Preliminary reading by , FNP  [facility]     Assessment & Plan:

## 2020-07-15 NOTE — Progress Notes (Signed)
Pt is returning nurse call about xray results

## 2020-07-18 ENCOUNTER — Telehealth: Payer: Self-pay | Admitting: Family

## 2020-07-18 NOTE — Telephone Encounter (Signed)
ok 

## 2020-07-27 ENCOUNTER — Other Ambulatory Visit: Payer: Self-pay

## 2020-07-27 ENCOUNTER — Encounter (HOSPITAL_COMMUNITY): Payer: Self-pay

## 2020-07-27 ENCOUNTER — Emergency Department (HOSPITAL_COMMUNITY): Payer: PPO

## 2020-07-27 ENCOUNTER — Emergency Department (HOSPITAL_COMMUNITY)
Admission: EM | Admit: 2020-07-27 | Discharge: 2020-07-27 | Disposition: A | Discharge status: Home / Self Care | Payer: PPO | Attending: Emergency Medicine | Admitting: Emergency Medicine

## 2020-07-27 DIAGNOSIS — Z79899 Other long term (current) drug therapy: Secondary | ICD-10-CM | POA: Diagnosis not present

## 2020-07-27 DIAGNOSIS — R1011 Right upper quadrant pain: Secondary | ICD-10-CM | POA: Diagnosis not present

## 2020-07-27 DIAGNOSIS — R109 Unspecified abdominal pain: Secondary | ICD-10-CM | POA: Diagnosis not present

## 2020-07-27 DIAGNOSIS — R232 Flushing: Secondary | ICD-10-CM | POA: Diagnosis not present

## 2020-07-27 DIAGNOSIS — K5900 Constipation, unspecified: Secondary | ICD-10-CM | POA: Insufficient documentation

## 2020-07-27 DIAGNOSIS — Z7982 Long term (current) use of aspirin: Secondary | ICD-10-CM | POA: Diagnosis not present

## 2020-07-27 DIAGNOSIS — K219 Gastro-esophageal reflux disease without esophagitis: Secondary | ICD-10-CM | POA: Diagnosis not present

## 2020-07-27 DIAGNOSIS — I10 Essential (primary) hypertension: Secondary | ICD-10-CM | POA: Insufficient documentation

## 2020-07-27 DIAGNOSIS — R197 Diarrhea, unspecified: Secondary | ICD-10-CM | POA: Insufficient documentation

## 2020-07-27 DIAGNOSIS — D72829 Elevated white blood cell count, unspecified: Secondary | ICD-10-CM | POA: Diagnosis not present

## 2020-07-27 DIAGNOSIS — R1010 Upper abdominal pain, unspecified: Secondary | ICD-10-CM | POA: Diagnosis not present

## 2020-07-27 DIAGNOSIS — R1084 Generalized abdominal pain: Secondary | ICD-10-CM

## 2020-07-27 LAB — COMPREHENSIVE METABOLIC PANEL
ALT: 218 U/L — ABNORMAL HIGH (ref 0–44)
AST: 214 U/L — ABNORMAL HIGH (ref 15–41)
Albumin: 4.2 g/dL (ref 3.5–5.0)
Alkaline Phosphatase: 118 U/L (ref 38–126)
Anion gap: 9 (ref 5–15)
BUN: 20 mg/dL (ref 8–23)
CO2: 27 mmol/L (ref 22–32)
Calcium: 9.4 mg/dL (ref 8.9–10.3)
Chloride: 92 mmol/L — ABNORMAL LOW (ref 98–111)
Creatinine, Ser: 1.17 mg/dL (ref 0.61–1.24)
GFR, Estimated: >60 mL/min (ref >60)
Glucose, Bld: 221 mg/dL — ABNORMAL HIGH (ref 70–99)
Potassium: 5.3 mmol/L — ABNORMAL HIGH (ref 3.5–5.1)
Sodium: 128 mmol/L — ABNORMAL LOW (ref 135–145)
Total Bilirubin: 1.8 mg/dL — ABNORMAL HIGH (ref 0.3–1.2)
Total Protein: 6.9 g/dL (ref 6.5–8.1)

## 2020-07-27 LAB — CBC WITH DIFFERENTIAL/PLATELET
Abs Immature Granulocytes: 0.06 10*3/uL (ref 0.00–0.07)
Basophils Absolute: 0 10*3/uL (ref 0.0–0.1)
Basophils Relative: 0 %
Eosinophils Absolute: 0 10*3/uL (ref 0.0–0.5)
Eosinophils Relative: 0 %
HCT: 41.9 % (ref 39.0–52.0)
Hemoglobin: 14.6 g/dL (ref 13.0–17.0)
Immature Granulocytes: 1 %
Lymphocytes Relative: 3 %
Lymphs Abs: 0.3 10*3/uL — ABNORMAL LOW (ref 0.7–4.0)
MCH: 31.3 pg (ref 26.0–34.0)
MCHC: 34.8 g/dL (ref 30.0–36.0)
MCV: 89.9 fL (ref 80.0–100.0)
Monocytes Absolute: 0.9 10*3/uL (ref 0.1–1.0)
Monocytes Relative: 8 %
Neutro Abs: 11.1 10*3/uL — ABNORMAL HIGH (ref 1.7–7.7)
Neutrophils Relative %: 88 %
Platelets: 205 10*3/uL (ref 150–400)
RBC: 4.66 MIL/uL (ref 4.22–5.81)
RDW: 12.1 % (ref 11.5–15.5)
WBC: 12.5 10*3/uL — ABNORMAL HIGH (ref 4.0–10.5)
nRBC: 0 % (ref 0.0–0.2)

## 2020-07-27 LAB — URINALYSIS, ROUTINE W REFLEX MICROSCOPIC
Bilirubin Urine: NEGATIVE
Glucose, UA: NEGATIVE mg/dL
Hgb urine dipstick: NEGATIVE
Ketones, ur: NEGATIVE mg/dL
Leukocytes,Ua: NEGATIVE
Nitrite: NEGATIVE
Protein, ur: NEGATIVE mg/dL
Specific Gravity, Urine: 1.017 (ref 1.005–1.030)
pH: 5 (ref 5.0–8.0)

## 2020-07-27 LAB — TROPONIN I (HIGH SENSITIVITY): Troponin I (High Sensitivity): 9 ng/L (ref <18)

## 2020-07-27 MED ORDER — BISACODYL 10 MG RE SUPP
10.0000 mg | Freq: Once | RECTAL | Status: AC
  Administered 2020-07-27: 10 mg via RECTAL
  Filled 2020-07-27: qty 1

## 2020-07-27 MED ORDER — KETOROLAC TROMETHAMINE 30 MG/ML IJ SOLN
15.0000 mg | Freq: Once | INTRAMUSCULAR | Status: AC
  Administered 2020-07-27: 15 mg via INTRAVENOUS
  Filled 2020-07-27: qty 1

## 2020-07-27 MED ORDER — SODIUM CHLORIDE 0.9 % IV BOLUS
1000.0000 mL | Freq: Once | INTRAVENOUS | Status: AC
  Administered 2020-07-27: 1000 mL via INTRAVENOUS

## 2020-07-27 MED ORDER — IOHEXOL 300 MG/ML  SOLN
100.0000 mL | Freq: Once | INTRAMUSCULAR | Status: AC | PRN
  Administered 2020-07-27: 100 mL via INTRAVENOUS

## 2020-07-27 MED ORDER — POLYETHYLENE GLYCOL 3350 17 G PO PACK: 17.0000 g | PACK | Freq: Two times a day (BID) | ORAL | 0 refills | Status: DC

## 2020-07-27 NOTE — Discharge Instructions (Addendum)
Liver tests were elevated.  You will need to follow-up with the primary care doctor regarding this finding within the next 2 to 3 days.  Call your primary care doctor or specialist as discussed in the next 2-3 days.   Return immediately back to the ER if:  Your symptoms worsen within the next 12-24 hours. You develop new symptoms such as new fevers, persistent vomiting, new pain, shortness of breath, or new weakness or numbness, or if you have any other concerns.

## 2020-07-27 NOTE — ED Provider Notes (Signed)
[facility] EMERGENCY DEPARTMENT Provider Note   CSN: [id number] Arrival date & time: 07/27/20  1618     History Chief Complaint  Patient presents with   Abdominal Pain    Charles Beltran is a 70 y.o. male.  Patient presents chief complaint of diffuse abdominal pain, constipation.  He states he has been struggling with constipation and diarrhea off and on for the past year.  He states that currently for the past week he has been constipated.  Describes abdominal bloating sensation and diffuse pain.  Denies fevers or cough.  Denies vomiting.  He also states that urgent care somewhat you said that his EKG was abnormal and he should get his heart checked out.  However denies any chest pain or shortness of breath.  No chest pressure.      Past Medical History:  Diagnosis Date   Childhood asthma    Hyperlipidemia    Hypertension     Patient Active Problem List   Diagnosis Date Noted   GERD (gastroesophageal reflux disease) 07/26/2017   Prediabetes 07/26/2016   Overweight (BMI 25.0-29.9) 07/22/2015   Vitamin D deficiency 10/05/2014   Heel spur 03/08/2014   HTN (hypertension) 05/01/2010   Hyperlipidemia 05/01/2010    History reviewed. No pertinent surgical history.     Family History  Problem Relation Age of Onset   Epilepsy Mother    Cancer Father        throat   Alcohol abuse Father    Heart attack Brother    Stroke Brother     Social History   Tobacco Use   Smoking status: Never   Smokeless tobacco: Never  Vaping Use   Vaping Use: Never used  Substance Use Topics   Alcohol use: No   Drug use: No    Home Medications Prior to Admission medications   Medication Sig Start Date End Date Taking? Authorizing Provider  ASPIRIN 81 PO Take 1 tablet by mouth daily.   Yes [provider]  atorvastatin (LIPITOR) 40 MG tablet TAKE 1 TABLET BY MOUTH EVERY DAY 10/16/19  Yes Hawks, Christy A, FNP  Cholecalciferol (VITAMIN D) 2000 UNITS tablet Take 2,000 Units by  mouth daily.   Yes [provider]  fish oil-omega-3 fatty acids 1000 MG capsule Take 1 g by mouth. One bid   Yes [provider]  lisinopril-hydrochlorothiazide (ZESTORETIC) 20-12.5 MG tablet Take 2 tablets by mouth daily. 10/16/19  Yes Jannifer Rodney A, FNP  metFORMIN (GLUCOPHAGE XR) 500 MG 24 hr tablet Take 1 tablet (500 mg total) by mouth daily with breakfast. 04/15/20 04/15/21 Yes Hawks, Christy A, FNP  omeprazole (PRILOSEC) 20 MG capsule TAKE 1 CAPSULE BY MOUTH EVERY DAY 06/09/20  Yes Hawks, Christy A, FNP  triamcinolone ointment (KENALOG) 0.5 % APPLY TO AFFECTED AREA TWICE A DAY 06/09/20  Yes Hawks, Christy A, FNP    Allergies    Penicillins  Review of Systems   Review of Systems  Constitutional:  Negative for fever.  HENT:  Negative for ear pain and sore throat.   Eyes:  Negative for pain.  Respiratory:  Negative for cough.   Cardiovascular:  Negative for chest pain.  Gastrointestinal:  Positive for abdominal pain.  Genitourinary:  Negative for flank pain.  Musculoskeletal:  Negative for back pain.  Skin:  Negative for color change and rash.  Neurological:  Negative for syncope.  All other systems reviewed and are negative.  Physical Exam Updated Vital Signs BP (!) 148/84   Pulse  73   Temp 98.7 F (37.1 C) (Oral)   Resp 18   Ht 5\' 9"  (1.753 m)   Wt 84.4 kg   SpO2 97%   BMI 27.48 kg/m   Physical Exam Constitutional:      General: He is not in acute distress.    Appearance: He is well-developed.  HENT:     Head: Normocephalic.     Nose: Nose normal.  Eyes:     Extraocular Movements: Extraocular movements intact.  Cardiovascular:     Rate and Rhythm: Normal rate.  Pulmonary:     Effort: Pulmonary effort is normal.  Abdominal:     Tenderness: There is no abdominal tenderness. There is no guarding or rebound.  Skin:    Coloration: Skin is not jaundiced.  Neurological:     Mental Status: He is alert. Mental status is at baseline.    ED Results /  Procedures / Treatments   Labs (all labs ordered are listed, but only abnormal results are displayed) Labs Reviewed  COMPREHENSIVE METABOLIC PANEL - Abnormal; Notable for the following components:      Result Value   Sodium 128 (*)    Potassium 5.3 (*)    Chloride 92 (*)    Glucose, Bld 221 (*)    AST 214 (*)    ALT 218 (*)    Total Bilirubin 1.8 (*)    All other components within normal limits  CBC WITH DIFFERENTIAL/PLATELET - Abnormal; Notable for the following components:   WBC 12.5 (*)    Neutro Abs 11.1 (*)    Lymphs Abs 0.3 (*)    All other components within normal limits  URINALYSIS, ROUTINE W REFLEX MICROSCOPIC  TROPONIN I (HIGH SENSITIVITY)    EKG None  Radiology CT ABDOMEN PELVIS W CONTRAST  Result Date: 07/27/2020 CLINICAL DATA:  Abdominal pain and distension for 1 week EXAM: CT ABDOMEN AND PELVIS WITH CONTRAST TECHNIQUE: Multidetector CT imaging of the abdomen and pelvis was performed using the standard protocol following bolus administration of intravenous contrast. CONTRAST:  07/29/2020 OMNIPAQUE IOHEXOL 300 MG/ML  SOLN COMPARISON:  Plain film from earlier in the same day and 07/12/2020 FINDINGS: Lower chest: No acute abnormality. Hepatobiliary: No focal liver abnormality is seen. No gallstones, gallbladder wall thickening, or biliary dilatation. Pancreas: Unremarkable. No pancreatic ductal dilatation or surrounding inflammatory changes. Spleen: Normal in size without focal abnormality. Adrenals/Urinary Tract: Adrenal glands are within normal limits. Kidneys demonstrate a normal enhancement pattern. Normal excretion is noted on delayed images. No renal calculi or obstructive changes are seen. The bladder is decompressed. Stomach/Bowel: Colon is predominately decompressed. No obstructive or inflammatory changes are seen. The appendix is within normal limits. The stomach is well distended with ingested food stuffs. In the second portion of the duodenum there edematous changes  within the wall as well as significant Periduodenal inflammatory change. These changes likely represent duodenitis. A focal duodenal ulcer could not be totally excluded at this time although no perforation to suggest ulcer is seen. Remainder of the small bowel is within normal limits. Small hiatal hernia is noted. Vascular/Lymphatic: Aortic atherosclerosis. No enlarged abdominal or pelvic lymph nodes. Reproductive: Prostate is unremarkable. Other: No abdominal wall hernia or abnormality. No abdominopelvic ascites. Musculoskeletal: Degenerative changes of lumbar spine are noted. IMPRESSION: Edema in the second portion of the duodenum with significant Peri duodenal inflammatory changes at least consistent with duodenitis. Possibility of a focal duodenal ulcer could not be totally excluded although no perforation is identified at this time.  No other focal abnormality is noted. Electronically Signed   By: Alcide Clever M.D.   On: 07/27/2020 19:35    Procedures Procedures   Medications Ordered in ED Medications  bisacodyl (DULCOLAX) suppository 10 mg (10 mg Rectal Given 07/27/20 1936)  sodium chloride 0.9 % bolus 1,000 mL (0 mLs Intravenous Stopped 07/27/20 1941)  iohexol (OMNIPAQUE) 300 MG/ML solution 100 mL (100 mLs Intravenous Contrast Given 07/27/20 1923)  ketorolac (TORADOL) 30 MG/ML injection 15 mg (15 mg Intravenous Given 07/27/20 1939)    ED Course  I have reviewed the triage vital signs and the nursing notes.  Pertinent labs & imaging results that were available during my care of the patient were reviewed by me and considered in my medical decision making (see chart for details).    MDM Rules/Calculators/A&P                          Patient labs show sodium 128 potassium 5.3.  Liver enzymes mildly elevated T bili 1.8.  White count of 12.5.  Abdominal exam is relatively benign with minimal tenderness on exam.  Patient given a liter bolus of fluids and Toradol for symptom management.  CT  abdomen pelvis results of the And the colon appears relatively decompressed per radiologist gallbladder is normal-appearing.  Ultrasound not available at this time.  I advised the patient outpatient follow with his doctor within 2 to 3 days.  Advising immediate return if he has fevers worsening pain or any additional concerns.  Final Clinical Impression(s) / ED Diagnoses Final diagnoses:  RUQ pain  Generalized abdominal pain  Constipation, unspecified constipation type    Rx / DC Orders ED Discharge Orders     None        Cheryll Cockayne, MD 07/27/20 2104

## 2020-07-27 NOTE — ED Triage Notes (Signed)
Sent from [facility] with c/o abd pain x 1 day and constipation x 7 days

## 2020-07-27 NOTE — ED Notes (Signed)
Patient transported to CT 

## 2020-07-29 ENCOUNTER — Telehealth: Payer: Self-pay | Admitting: Family

## 2020-08-02 ENCOUNTER — Ambulatory Visit (INDEPENDENT_AMBULATORY_CARE_PROVIDER_SITE_OTHER): Payer: PPO

## 2020-08-02 VITALS — Ht 69.0 in | Wt 182.0 lb

## 2020-08-02 DIAGNOSIS — Z0000 Encounter for general adult medical examination without abnormal findings: Secondary | ICD-10-CM

## 2020-08-02 NOTE — Telephone Encounter (Signed)
Patient aware and states he already has an appointment with her scheduled.

## 2020-08-02 NOTE — Telephone Encounter (Signed)
Pt needs hospital follow up

## 2020-08-02 NOTE — Progress Notes (Signed)
Subjective:   Charles Beltran is a 70 y.o. male who presents for Medicare Annual/Subsequent preventive examination.  Virtual Visit via Telephone Note  I connected with  Charles Beltran on 08/02/20 at 10:30 AM EDT by telephone and verified that I am speaking with the correct person using two identifiers.  Location: Patient: Home Provider: [facility] Persons participating in the virtual visit: patient/Nurse Health Advisor   I discussed the limitations, risks, security and privacy concerns of performing an evaluation and management service by telephone and the availability of in person appointments. The patient expressed understanding and agreed to proceed.  Interactive audio and video telecommunications were attempted between this nurse and patient, however failed, due to patient having technical difficulties OR patient did not have access to video capability.  We continued and completed visit with audio only.  Some vital signs may be absent or patient reported.   Numan Zylstra E Gennifer Potenza, LPN   Review of Systems     Cardiac Risk Factors include: advanced age (>2255men, 84>65 women);dyslipidemia;diabetes mellitus;hypertension;male gender;sedentary lifestyle     Objective:    Today's Vitals   08/02/20 1032  Weight: 182 lb (82.6 kg)  Height: 5\' 9"  (1.753 m)  PainSc: 0-No pain   Body mass index is 26.88 kg/m.  Advanced Directives 08/02/2020 07/27/2020 07/27/2019 05/15/2019 07/23/2018 03/08/2014  Does Patient Have a Medical Advance Directive? No No No No No No  Would patient like information on creating a medical advance directive? No - Patient declined - No - Patient declined - No - Patient declined Yes - Educational materials given    Current Medications (verified) Outpatient Encounter Medications as of 08/02/2020  Medication Sig   ASPIRIN 81 PO Take 1 tablet by mouth daily.   atorvastatin (LIPITOR) 40 MG tablet TAKE 1 TABLET BY MOUTH EVERY DAY   Cholecalciferol (VITAMIN D) 2000 UNITS tablet Take 2,000  Units by mouth daily.   dicyclomine (BENTYL) 20 MG tablet Take 20 mg by mouth 4 (four) times daily as needed.   fish oil-omega-3 fatty acids 1000 MG capsule Take 1 g by mouth. One bid   lisinopril-hydrochlorothiazide (ZESTORETIC) 20-12.5 MG tablet Take 2 tablets by mouth daily.   metFORMIN (GLUCOPHAGE XR) 500 MG 24 hr tablet Take 1 tablet (500 mg total) by mouth daily with breakfast.   omeprazole (PRILOSEC) 20 MG capsule TAKE 1 CAPSULE BY MOUTH EVERY DAY   polyethylene glycol (MIRALAX) 17 g packet Take 17 g by mouth 2 (two) times daily.   triamcinolone ointment (KENALOG) 0.5 % APPLY TO AFFECTED AREA TWICE A DAY   No facility-administered encounter medications on file as of 08/02/2020.    Allergies (verified) Penicillins   History: Past Medical History:  Diagnosis Date   Childhood asthma    Hyperlipidemia    Hypertension    History reviewed. No pertinent surgical history. Family History  Problem Relation Age of Onset   Epilepsy Mother    Cancer Father        throat   Alcohol abuse Father    Heart attack Brother    Stroke Brother    Social History   Socioeconomic History   Marital status: Single    Spouse name: Not on file   Number of children: 0   Years of education: 16   Highest education level: Bachelor's degree (e.g., BA, AB, BS)  Occupational History   Occupation: Retired  Tobacco Use   Smoking status: Never   Smokeless tobacco: Never  Vaping Use   Vaping Use: Never  used  Substance and Sexual Activity   Alcohol use: No   Drug use: No   Sexual activity: Not Currently  Other Topics Concern   Not on file  Social History Narrative   Lives alone in an apartment with an Engineer, structural. Brother lives nearby, but most of family passed or lives out of state. No children, never married.   Social Determinants of Health   Financial Resource Strain: Low Risk    Difficulty of Paying Living Expenses: Not hard at all  Food Insecurity: No Food Insecurity   Worried About  Programme researcher, broadcasting/film/video in the Last Year: Never true   Ran Out of Food in the Last Year: Never true  Transportation Needs: No Transportation Needs   Lack of Transportation (Medical): No   Lack of Transportation (Non-Medical): No  Physical Activity: Inactive   Days of Exercise per Week: 0 days   Minutes of Exercise per Session: 0 min  Stress: No Stress Concern Present   Feeling of Stress : Not at all  Social Connections: Socially Isolated   Frequency of Communication with Friends and Family: Twice a week   Frequency of Social Gatherings with Friends and Family: Once a week   Attends Religious Services: Never   Database administrator or Organizations: No   Attends Engineer, structural: Never   Marital Status: Never married    Tobacco Counseling Counseling given: Not Answered   Clinical Intake:  Pre-visit preparation completed: Yes  Pain : No/denies pain Pain Score: 0-No pain     BMI - recorded: 26.88 Nutritional Status: BMI 25 -29 Overweight Nutritional Risks: None Diabetes: Yes CBG done?: No Did pt. bring in CBG monitor from home?: No  How often do you need to have someone help you when you read instructions, pamphlets, or other written materials from your doctor or pharmacy?: 1 - Never  Nutrition Risk Assessment:  Has the patient had any N/V/D within the last 2 months?  Yes  Does the patient have any non-healing wounds?  No  Has the patient had any unintentional weight loss or weight gain?  No   Diabetes:  Is the patient diabetic?  Yes  If diabetic, was a CBG obtained today?  No  Did the patient bring in their glucometer from home?  No  How often do you monitor your CBG's? Doesn't check at home.   Financial Strains and Diabetes Management:  Are you having any financial strains with the device, your supplies or your medication? No .  Does the patient want to be seen by Chronic Care Management for management of their diabetes?  No  Would the patient like  to be referred to a Nutritionist or for Diabetic Management?  No   Diabetic Exams:  Diabetic Eye Exam: Completed 2020. Overdue for diabetic eye exam. Pt has been advised about the importance in completing this exam. He has an appointment in August 2022  Diabetic Foot Exam: past due. Pt has been advised about the importance in completing this exam. Pt is scheduled for diabetic foot exam on 08/05/2020.    Interpreter Needed?: No  Information entered by :: Jake Fuhrmann, LPN   Activities of Daily Living In your present state of health, do you have any difficulty performing the following activities: 08/02/2020  Hearing? N  Vision? N  Difficulty concentrating or making decisions? N  Walking or climbing stairs? N  Dressing or bathing? N  Doing errands, shopping? N  Preparing Food and eating ? N  Using the Toilet? N  In the past six months, have you accidently leaked urine? N  Do you have problems with loss of bowel control? Y  Comment rarely - only when stomach is upset and he can't quite make it to restroom  Managing your Medications? N  Managing your Finances? N  Housekeeping or managing your Housekeeping? N  Some recent data might be hidden    Patient Care Team: Junie Spencer, FNP as PCP - General (Family Medicine)  Indicate any recent Medical Services you may have received from other than [facility] providers in the past year (date may be approximate).     Assessment:   This is a routine wellness examination for Jones.  Hearing/Vision screen Hearing Screening - Comments:: Denies hearing difficulties  Vision Screening - Comments:: No vision concerns - behind on eye exam - Has appt with [facility] 09/2020  Dietary issues and exercise activities discussed: Current Exercise Habits: The patient does not participate in regular exercise at present, Exercise limited by: None identified   Goals Addressed             This Visit's Progress    DIET - INCREASE WATER INTAKE   On track     Try to drink 6-8 glasses of water daily      Exercise 150 min/wk Moderate Activity   Not on track    Walking is a great option.        Depression Screen PHQ 2/9 Scores 08/02/2020 07/12/2020 06/14/2020 04/14/2020 10/16/2019 07/27/2019 03/10/2019  PHQ - 2 Score 0 0 0 0 0 0 0    Fall Risk Fall Risk  08/02/2020 07/12/2020 06/14/2020 04/14/2020 10/16/2019  Falls in the past year? 0 0 0 0 0  Number falls in past yr: 0 - - - -  Injury with Fall? 0 - - - -  Risk for fall due to : No Fall Risks - - - -  Follow up Falls prevention discussed - - - -    FALL RISK PREVENTION PERTAINING TO THE HOME:  Any stairs in or around the home? Yes  - lives in apartment - also has elevator If so, are there any without handrails? No  Home free of loose throw rugs in walkways, pet beds, electrical cords, etc? Yes  Adequate lighting in your home to reduce risk of falls? Yes   ASSISTIVE DEVICES UTILIZED TO PREVENT FALLS:  Life alert? No  Use of a cane, walker or w/c? No  Grab bars in the bathroom? Yes  Shower chair or bench in shower? Yes  Elevated toilet seat or a handicapped toilet? No   TIMED UP AND GO:  Was the test performed? No . Telephonic visit  Cognitive Function: Normal cognitive status assessed by direct observation by this Nurse Health Advisor. No abnormalities found.       6CIT Screen 07/27/2019 07/23/2018  What Year? 0 points 0 points  What month? 0 points 0 points  What time? 0 points 0 points  Count back from 20 0 points 0 points  Months in reverse 0 points 0 points  Repeat phrase 0 points 0 points  Total Score 0 0    Immunizations Immunization History  Administered Date(s) Administered   DTaP 02/05/2001   Influenza Whole 10/26/2009   Influenza, High Dose Seasonal PF 10/04/2016, 10/21/2018, 10/19/2019   Influenza, Seasonal, Injecte, Preservative Fre 01/21/2009, 10/26/2009, 10/10/2010, 10/26/2011   Influenza,inj,Quad PF,6+ Mos 10/31/2012, 11/03/2013, 09/19/2015    Influenza-Unspecified 10/31/2012, 11/03/2013, 10/27/2014, 10/19/2019  Moderna Sars-Covid-2 Vaccination 05/12/2019, 06/09/2019, 12/15/2019, 05/17/2020   Pneumococcal Conjugate-13 02/10/2013   Pneumococcal Polysaccharide-23 01/24/2017   Tdap 12/14/2011   Zoster, Live 08/18/2015    TDAP status: Up to date  Flu Vaccine status: Up to date  Pneumococcal vaccine status: Up to date  Covid-19 vaccine status: Completed vaccines  Qualifies for Shingles Vaccine? Yes   Zostavax completed Yes   Shingrix Completed?: No.    Education has been provided regarding the importance of this vaccine. Patient has been advised to call insurance company to determine out of pocket expense if they have not yet received this vaccine. Advised may also receive vaccine at local pharmacy or Health Dept. Verbalized acceptance and understanding.  Screening Tests Health Maintenance  Topic Date Due   Zoster Vaccines- Shingrix (1 of 2) Never done   Fecal DNA (Cologuard)  06/14/2021 (Originally 02/07/2001)   INFLUENZA VACCINE  09/05/2020   TETANUS/TDAP  12/13/2021   COVID-19 Vaccine  Completed   Hepatitis C Screening  Completed   PNA vac Low Risk Adult  Completed   HPV VACCINES  Aged Out    Health Maintenance  Health Maintenance Due  Topic Date Due   Zoster Vaccines- Shingrix (1 of 2) Never done    Colorectal cancer screening: Due - will discuss with Christy at 08/05/20 appt  Lung Cancer Screening: (Low Dose CT Chest recommended if Age 21-80 years, 30 pack-year currently smoking OR have quit w/in 15years.) does not qualify.   Additional Screening:  Hepatitis C Screening: does qualify; Completed 10/05/2014  Vision Screening: Recommended annual ophthalmology exams for early detection of glaucoma and other disorders of the eye. Is the patient up to date with their annual eye exam?  No  Who is the provider or what is the name of the office in which the patient attends annual eye exams? [facility] If pt is  not established with a provider, would they like to be referred to a provider to establish care? No .   Dental Screening: Recommended annual dental exams for proper oral hygiene  Community Resource Referral / Chronic Care Management: CRR required this visit?  No   CCM required this visit?  No      Plan:     I have personally reviewed and noted the following in the patient's chart:   Medical and social history Use of alcohol, tobacco or illicit drugs  Current medications and supplements including opioid prescriptions. Patient is not currently taking opioid prescriptions. Functional ability and status Nutritional status Physical activity Advanced directives List of other physicians Hospitalizations, surgeries, and ER visits in previous 12 months Vitals Screenings to include cognitive, depression, and falls Referrals and appointments  In addition, I have reviewed and discussed with patient certain preventive protocols, quality metrics, and best practice recommendations. A written personalized care plan for preventive services as well as general preventive health recommendations were provided to patient.     [place] Constable, LPN   [id number]   Nurse Notes: None

## 2020-08-02 NOTE — Patient Instructions (Signed)
Charles Beltran , Thank you for taking time to come for your Medicare Wellness Visit. I appreciate your ongoing commitment to your health goals. Please review the following plan we discussed and let me know if I can assist you in the future.   Screening recommendations/referrals: Colonoscopy: Due Recommended yearly ophthalmology/optometry visit for glaucoma screening and checkup Recommended yearly dental visit for hygiene and checkup  Vaccinations: Influenza vaccine: Done 10/19/2019 - Repeat annually Pneumococcal vaccine: Done 02/10/2013 & 01/24/2017 Tdap vaccine: Done 12/14/2011 - Repeat in 10 years Shingles vaccine: Zostavax done 08/18/2015 - due for Shingrix discussed. Please contact your pharmacy for coverage information.    Covid-19: Done 05/12/19, 06/09/19, 12/15/19 & 05/17/20  Advanced directives: Advance directive discussed with you today. Even though you declined this today, please call our office should you change your mind, and we can give you the proper paperwork for you to fill out.  Conditions/risks identified: Aim for 30 minutes of exercise or brisk walking each day, drink 6-8 glasses of water and eat lots of fruits and vegetables.  Next appointment: Follow up in one year for your annual wellness visit.   Preventive Care 86 Years and Older, Male  Preventive care refers to lifestyle choices and visits with your health care provider that can promote health and wellness. What does preventive care include? A yearly physical exam. This is also called an annual well check. Dental exams once or twice a year. Routine eye exams. Ask your health care provider how often you should have your eyes checked. Personal lifestyle choices, including: Daily care of your teeth and gums. Regular physical activity. Eating a healthy diet. Avoiding tobacco and drug use. Limiting alcohol use. Practicing safe sex. Taking low doses of aspirin every day. Taking vitamin and mineral supplements as recommended by  your health care provider. What happens during an annual well check? The services and screenings done by your health care provider during your annual well check will depend on your age, overall health, lifestyle risk factors, and family history of disease. Counseling  Your health care provider may ask you questions about your: Alcohol use. Tobacco use. Drug use. Emotional well-being. Home and relationship well-being. Sexual activity. Eating habits. History of falls. Memory and ability to understand (cognition). Work and work Astronomer. Screening  You may have the following tests or measurements: Height, weight, and BMI. Blood pressure. Lipid and cholesterol levels. These may be checked every 5 years, or more frequently if you are over 101 years old. Skin check. Lung cancer screening. You may have this screening every year starting at age 88 if you have a 30-pack-year history of smoking and currently smoke or have quit within the past 15 years. Fecal occult blood test (FOBT) of the stool. You may have this test every year starting at age 62. Flexible sigmoidoscopy or colonoscopy. You may have a sigmoidoscopy every 5 years or a colonoscopy every 10 years starting at age 54. Prostate cancer screening. Recommendations will vary depending on your family history and other risks. Hepatitis C blood test. Hepatitis B blood test. Sexually transmitted disease (STD) testing. Diabetes screening. This is done by checking your blood sugar (glucose) after you have not eaten for a while (fasting). You may have this done every 1-3 years. Abdominal aortic aneurysm (AAA) screening. You may need this if you are a current or former smoker. Osteoporosis. You may be screened starting at age 77 if you are at high risk. Talk with your health care provider about your test results, treatment  options, and if necessary, the need for more tests. Vaccines  Your health care provider may recommend certain vaccines,  such as: Influenza vaccine. This is recommended every year. Tetanus, diphtheria, and acellular pertussis (Tdap, Td) vaccine. You may need a Td booster every 10 years. Zoster vaccine. You may need this after age 27. Pneumococcal 13-valent conjugate (PCV13) vaccine. One dose is recommended after age 62. Pneumococcal polysaccharide (PPSV23) vaccine. One dose is recommended after age 70. Talk to your health care provider about which screenings and vaccines you need and how often you need them. This information is not intended to replace advice given to you by your health care provider. Make sure you discuss any questions you have with your health care provider. Document Released: 02/18/2015 Document Revised: 10/12/2015 Document Reviewed: 11/23/2014 Elsevier Interactive Patient Education  2017 [place] Prevention in the Home Falls can cause injuries. They can happen to people of all ages. There are many things you can do to make your home safe and to help prevent falls. What can I do on the outside of my home? Regularly fix the edges of walkways and driveways and fix any cracks. Remove anything that might make you trip as you walk through a door, such as a raised step or threshold. Trim any bushes or trees on the path to your home. Use bright outdoor lighting. Clear any walking paths of anything that might make someone trip, such as rocks or tools. Regularly check to see if handrails are loose or broken. Make sure that both sides of any steps have handrails. Any raised decks and porches should have guardrails on the edges. Have any leaves, snow, or ice cleared regularly. Use sand or salt on walking paths during winter. Clean up any spills in your garage right away. This includes oil or grease spills. What can I do in the bathroom? Use night lights. Install grab bars by the toilet and in the tub and shower. Do not use towel bars as grab bars. Use non-skid mats or decals in the tub or  shower. If you need to sit down in the shower, use a plastic, non-slip stool. Keep the floor dry. Clean up any water that spills on the floor as soon as it happens. Remove soap buildup in the tub or shower regularly. Attach bath mats securely with double-sided non-slip rug tape. Do not have throw rugs and other things on the floor that can make you trip. What can I do in the bedroom? Use night lights. Make sure that you have a light by your bed that is easy to reach. Do not use any sheets or blankets that are too big for your bed. They should not hang down onto the floor. Have a firm chair that has side arms. You can use this for support while you get dressed. Do not have throw rugs and other things on the floor that can make you trip. What can I do in the kitchen? Clean up any spills right away. Avoid walking on wet floors. Keep items that you use a lot in easy-to-reach places. If you need to reach something above you, use a strong step stool that has a grab bar. Keep electrical cords out of the way. Do not use floor polish or wax that makes floors slippery. If you must use wax, use non-skid floor wax. Do not have throw rugs and other things on the floor that can make you trip. What can I do with my stairs? Do not  leave any items on the stairs. Make sure that there are handrails on both sides of the stairs and use them. Fix handrails that are broken or loose. Make sure that handrails are as long as the stairways. Check any carpeting to make sure that it is firmly attached to the stairs. Fix any carpet that is loose or worn. Avoid having throw rugs at the top or bottom of the stairs. If you do have throw rugs, attach them to the floor with carpet tape. Make sure that you have a light switch at the top of the stairs and the bottom of the stairs. If you do not have them, ask someone to add them for you. What else can I do to help prevent falls? Wear shoes that: Do not have high heels. Have  rubber bottoms. Are comfortable and fit you well. Are closed at the toe. Do not wear sandals. If you use a stepladder: Make sure that it is fully opened. Do not climb a closed stepladder. Make sure that both sides of the stepladder are locked into place. Ask someone to hold it for you, if possible. Clearly mark and make sure that you can see: Any grab bars or handrails. First and last steps. Where the edge of each step is. Use tools that help you move around (mobility aids) if they are needed. These include: Canes. Walkers. Scooters. Crutches. Turn on the lights when you go into a dark area. Replace any light bulbs as soon as they burn out. Set up your furniture so you have a clear path. Avoid moving your furniture around. If any of your floors are uneven, fix them. If there are any pets around you, be aware of where they are. Review your medicines with your doctor. Some medicines can make you feel dizzy. This can increase your chance of falling. Ask your doctor what other things that you can do to help prevent falls. This information is not intended to replace advice given to you by your health care provider. Make sure you discuss any questions you have with your health care provider. Document Released: 11/18/2008 Document Revised: 06/30/2015 Document Reviewed: 02/26/2014 Elsevier Interactive Patient Education  2017 [employer]  Gastritis, Adult Gastritis is inflammation of the stomach. There are two kinds of gastritis: Acute gastritis. This kind develops suddenly. Chronic gastritis. This kind is much more common and lasts for a long time. Gastritis happens when the lining of the stomach becomes weak or gets damaged.Without treatment, gastritis can lead to stomach bleeding and ulcers. What are the causes? This condition may be caused by: An infection. Drinking too much alcohol. Certain medicines. These include steroids, antibiotics, and some over-the-counter medicines, such as  aspirin or ibuprofen. Having too much acid in the stomach. A disease of the intestines or stomach. Stress. An allergic reaction. Crohn's disease. Some cancer treatments (radiation). Sometimes the cause of this condition is not known. What are the signs or symptoms? Symptoms of this condition include: Pain or a burning sensation in the upper abdomen. Nausea. Vomiting. An uncomfortable feeling of fullness after eating. Weight loss. Bad breath. Blood in your vomit or stools. In some cases, there are no symptoms. How is this diagnosed? This condition may be diagnosed with: Your medical history and a description of your symptoms. A physical exam. Tests. These can include: Blood tests. Stool tests. A test in which a thin, flexible instrument with a light and a camera is passed down the esophagus and into the stomach (upper endoscopy). A  test in which a sample of tissue is taken for testing (biopsy). How is this treated? This condition may be treated with medicines. The medicines that are used vary depending on the cause of the gastritis: If the condition is caused by a bacterial infection, you may be given antibiotic medicines. If the condition is caused by too much acid in the stomach, you may be given medicines called H2 blockers, proton pump inhibitors, or antacids. Treatment may also involve stopping the use of certain medicines, such asaspirin, ibuprofen, or other NSAIDs. Follow these instructions at home: Medicines Take over-the-counter and prescription medicines only as told by your health care provider. If you were prescribed an antibiotic medicine, take it as told by your health care provider. Do not stop taking the antibiotic even if you start to feel better. Eating and drinking  Eat small, frequent meals instead of large meals. Avoid foods and drinks that make your symptoms worse. Drink enough fluid to keep your urine pale yellow.  Alcohol use Do not drink alcohol  if: Your health care provider tells you not to drink. You are pregnant, may be pregnant, or are planning to become pregnant. If you drink alcohol: Limit your use to: 0-1 drink a day for women. 0-2 drinks a day for men. Be aware of how much alcohol is in your drink. In the [place] one drink equals one 12 oz bottle of beer (355 mL), one 5 oz glass of wine (148 mL), or one 1 oz glass of hard liquor (44 mL). General instructions Talk with your health care provider about ways to manage stress, such as getting regular exercise or practicing deep breathing, meditation, or yoga. Do not use any products that contain nicotine or tobacco, such as cigarettes and e-cigarettes. If you need help quitting, ask your health care provider. Keep all follow-up visits as told by your health care provider. This is important. Contact a health care provider if: Your symptoms get worse. Your symptoms return after treatment. Get help right away if: You vomit blood or material that looks like coffee grounds. You have black or dark red stools. You are unable to keep fluids down. Your abdominal pain gets worse. You have a fever. You do not feel better after one week. Summary Gastritis is inflammation of the lining of the stomach that can occur suddenly (acute) or develop slowly over time (chronic). This condition is diagnosed with a medical history, a physical exam, or tests. This condition may be treated with medicines to treat infection or medicines to reduce the amount of acid in your stomach. Follow your health care provider's instructions about taking medicines, making changes to your diet, and knowing when to call for help. This information is not intended to replace advice given to you by your health care provider. Make sure you discuss any questions you have with your healthcare provider. Document Revised: 06/11/2017 Document Reviewed: 06/11/2017 Elsevier Patient Education  2022 [employer].

## 2020-08-05 ENCOUNTER — Encounter: Payer: Self-pay | Admitting: Family

## 2020-08-05 ENCOUNTER — Ambulatory Visit (INDEPENDENT_AMBULATORY_CARE_PROVIDER_SITE_OTHER): Payer: PPO | Admitting: Family

## 2020-08-05 ENCOUNTER — Other Ambulatory Visit: Payer: Self-pay

## 2020-08-05 VITALS — BP 138/78 | HR 90 | Temp 98.3°F | Ht 69.0 in | Wt 181.0 lb

## 2020-08-05 DIAGNOSIS — R748 Abnormal levels of other serum enzymes: Secondary | ICD-10-CM

## 2020-08-05 DIAGNOSIS — Z09 Encounter for follow-up examination after completed treatment for conditions other than malignant neoplasm: Secondary | ICD-10-CM

## 2020-08-05 DIAGNOSIS — R109 Unspecified abdominal pain: Secondary | ICD-10-CM | POA: Diagnosis not present

## 2020-08-05 NOTE — Patient Instructions (Signed)

## 2020-08-05 NOTE — Progress Notes (Signed)
Subjective:    Patient ID: Charles Beltran, male    DOB: [birth year], 70 y.o.   MRN: [id number]  Chief Complaint  Patient presents with   Hospitalization Follow-up   PT presents to the office today for hospital follow up. He went to the ED on 07/27/20 for generalized abdominal pain and diagnosed with constipation.   His CT showed, " Edema in the second portion of the duodenum with significant Peri duodenal inflammatory changes at least consistent with duodenitis. Possibility of a focal duodenal ulcer could not be totally excluded although no perforation is identified at this time."  His liver enzymes were elevated at this time too with AST 214, ALT 218. Denies any alcohol use.  Abdominal Pain This is a new problem. The current episode started more than 1 year ago. The problem occurs intermittently. The problem has been waxing and waning. The pain is located in the generalized abdominal region. The pain is moderate. The quality of the pain is burning. The abdominal pain does not radiate. Associated symptoms include constipation, diarrhea and flatus. Pertinent negatives include no belching, dysuria, hematuria, nausea or vomiting. Nothing aggravates the pain. The pain is relieved by Nothing. He has tried antacids for the symptoms. The treatment provided no relief. Prior diagnostic workup includes CT scan.     Review of Systems  Gastrointestinal:  Positive for abdominal pain, constipation, diarrhea and flatus. Negative for nausea and vomiting.  Genitourinary:  Negative for dysuria and hematuria.  All other systems reviewed and are negative.     Objective:   Physical Exam Vitals reviewed.  Constitutional:      General: He is not in acute distress.    Appearance: He is well-developed. He is obese.  HENT:     Head: Normocephalic.     Right Ear: Tympanic membrane normal.     Left Ear: Tympanic membrane normal.  Eyes:     General:        Right eye: No discharge.        Left eye: No  discharge.     Pupils: Pupils are equal, round, and reactive to light.  Neck:     Thyroid: No thyromegaly.  Cardiovascular:     Rate and Rhythm: Normal rate and regular rhythm.     Heart sounds: Normal heart sounds. No murmur heard. Pulmonary:     Effort: Pulmonary effort is normal. No respiratory distress.     Breath sounds: Normal breath sounds. No wheezing.  Abdominal:     General: Bowel sounds are normal. There is no distension.     Palpations: Abdomen is soft.     Tenderness: There is no abdominal tenderness.  Musculoskeletal:        General: No tenderness. Normal range of motion.     Cervical back: Normal range of motion and neck supple.  Skin:    General: Skin is warm and dry.     Findings: No erythema or rash.  Neurological:     Mental Status: He is alert and oriented to person, place, and time.     Cranial Nerves: No cranial nerve deficit.     Deep Tendon Reflexes: Reflexes are normal and symmetric.  Psychiatric:        Behavior: Behavior normal.        Thought Content: Thought content normal.        Judgment: Judgment normal.      BP 138/78   Pulse 90   Temp 98.3 F (36.8 C) (Temporal)  Ht _0  (1.753 m)   Wt 181 lb (82.1 kg)   BMI 26.73 kg/m      Assessment & Plan:  Charles Beltran comes in today with chief complaint of Hospitalization Follow-up   Diagnosis and orders addressed:  1. Hospital discharge follow-up  - CBC with Differential/Platelet - BMP8+EGFR - Hepatic function panel - Ambulatory referral to Gastroenterology  2. Abdominal pain, unspecified abdominal location - CBC with Differential/Platelet - BMP8+EGFR - Hepatic function panel - Ambulatory referral to Gastroenterology  3. Elevated liver enzymes - CBC with Differential/Platelet - BMP8+EGFR - Hepatic function panel - Ambulatory referral to Gastroenterology   Labs pending Health Maintenance reviewed Diet and exercise encouraged  Follow up plan: Keep chronic follow  up   Evelina Dun, FNP

## 2020-08-06 LAB — CBC WITH DIFFERENTIAL/PLATELET
Basophils Absolute: 0 x10E3/uL (ref 0.0–0.2)
Basos: 0 %
EOS (ABSOLUTE): 0.1 x10E3/uL (ref 0.0–0.4)
Eos: 1 %
Hematocrit: 41.8 % (ref 37.5–51.0)
Hemoglobin: 14.6 g/dL (ref 13.0–17.7)
Immature Grans (Abs): 0.2 x10E3/uL — ABNORMAL HIGH (ref 0.0–0.1)
Immature Granulocytes: 2 %
Lymphocytes Absolute: 1 x10E3/uL (ref 0.7–3.1)
Lymphs: 9 %
MCH: 30.7 pg (ref 26.6–33.0)
MCHC: 34.9 g/dL (ref 31.5–35.7)
MCV: 88 fL (ref 79–97)
Monocytes Absolute: 1.4 x10E3/uL — ABNORMAL HIGH (ref 0.1–0.9)
Monocytes: 13 %
Neutrophils Absolute: 7.8 x10E3/uL — ABNORMAL HIGH (ref 1.4–7.0)
Neutrophils: 75 %
Platelets: 282 x10E3/uL (ref 150–450)
RBC: 4.75 x10E6/uL (ref 4.14–5.80)
RDW: 11.8 % (ref 11.6–15.4)
WBC: 10.7 x10E3/uL (ref 3.4–10.8)

## 2020-08-06 LAB — BMP8+EGFR
BUN/Creatinine Ratio: 15 (ref 10–24)
BUN: 14 mg/dL (ref 8–27)
CO2: 22 mmol/L (ref 20–29)
Calcium: 9.8 mg/dL (ref 8.6–10.2)
Chloride: 86 mmol/L — ABNORMAL LOW (ref 96–106)
Creatinine, Ser: 0.94 mg/dL (ref 0.76–1.27)
Glucose: 197 mg/dL — ABNORMAL HIGH (ref 65–99)
Potassium: 5.7 mmol/L — ABNORMAL HIGH (ref 3.5–5.2)
Sodium: 127 mmol/L — ABNORMAL LOW (ref 134–144)
eGFR: 88 mL/min/1.73

## 2020-08-06 LAB — HEPATIC FUNCTION PANEL
ALT: 45 IU/L — ABNORMAL HIGH (ref 0–44)
AST: 25 IU/L (ref 0–40)
Albumin: 4.3 g/dL (ref 3.8–4.8)
Alkaline Phosphatase: 94 IU/L (ref 44–121)
Bilirubin Total: 0.5 mg/dL (ref 0.0–1.2)
Bilirubin, Direct: 0.24 mg/dL (ref 0.00–0.40)
Total Protein: 7 g/dL (ref 6.0–8.5)

## 2020-08-09 ENCOUNTER — Other Ambulatory Visit: Payer: Self-pay | Admitting: Family

## 2020-08-09 ENCOUNTER — Telehealth: Payer: Self-pay | Admitting: Family

## 2020-08-09 DIAGNOSIS — E875 Hyperkalemia: Secondary | ICD-10-CM

## 2020-08-09 MED ORDER — AMLODIPINE BESYLATE 10 MG PO TABS: 10.0000 mg | ORAL_TABLET | Freq: Every day | ORAL | 3 refills | Status: DC

## 2020-08-09 NOTE — Telephone Encounter (Signed)
Pt needs to follow up with GI. He had elevated liver enzymes (which are better now), but still  having this abdominal pain that comes and go.

## 2020-08-10 ENCOUNTER — Other Ambulatory Visit: Payer: PPO

## 2020-08-10 ENCOUNTER — Telehealth: Payer: Self-pay | Admitting: Family

## 2020-08-10 ENCOUNTER — Other Ambulatory Visit: Payer: Self-pay

## 2020-08-10 DIAGNOSIS — E875 Hyperkalemia: Secondary | ICD-10-CM

## 2020-08-10 LAB — BMP8+EGFR
BUN/Creatinine Ratio: 16 (ref 10–24)
BUN: 14 mg/dL (ref 8–27)
CO2: 25 mmol/L (ref 20–29)
Calcium: 9.7 mg/dL (ref 8.6–10.2)
Chloride: 94 mmol/L — ABNORMAL LOW (ref 96–106)
Creatinine, Ser: 0.89 mg/dL (ref 0.76–1.27)
Glucose: 125 mg/dL — ABNORMAL HIGH (ref 65–99)
Potassium: 4.8 mmol/L (ref 3.5–5.2)
Sodium: 132 mmol/L — ABNORMAL LOW (ref 134–144)
eGFR: 93 mL/min/{1.73_m2} (ref >59)

## 2020-08-10 NOTE — Telephone Encounter (Signed)
Pt refuses to GO to GI

## 2020-08-11 NOTE — Telephone Encounter (Signed)
I will still be patient PCP and can follow up with me. However, given his abdominal pain, elevated liver enzymes I strongly recommend following up with GI>

## 2020-08-16 ENCOUNTER — Encounter: Payer: Self-pay | Admitting: Family

## 2020-08-16 ENCOUNTER — Ambulatory Visit (INDEPENDENT_AMBULATORY_CARE_PROVIDER_SITE_OTHER): Payer: PPO | Admitting: Family

## 2020-08-16 ENCOUNTER — Other Ambulatory Visit: Payer: Self-pay

## 2020-08-16 VITALS — BP 140/59 | HR 100 | Temp 98.0°F | Ht 69.0 in | Wt 179.2 lb

## 2020-08-16 DIAGNOSIS — K219 Gastro-esophageal reflux disease without esophagitis: Secondary | ICD-10-CM

## 2020-08-16 DIAGNOSIS — E663 Overweight: Secondary | ICD-10-CM | POA: Diagnosis not present

## 2020-08-16 DIAGNOSIS — E559 Vitamin D deficiency, unspecified: Secondary | ICD-10-CM | POA: Diagnosis not present

## 2020-08-16 DIAGNOSIS — I10 Essential (primary) hypertension: Secondary | ICD-10-CM

## 2020-08-16 DIAGNOSIS — R7303 Prediabetes: Secondary | ICD-10-CM | POA: Diagnosis not present

## 2020-08-16 DIAGNOSIS — E785 Hyperlipidemia, unspecified: Secondary | ICD-10-CM

## 2020-08-16 DIAGNOSIS — R1084 Generalized abdominal pain: Secondary | ICD-10-CM | POA: Diagnosis not present

## 2020-08-16 LAB — BAYER DCA HB A1C WAIVED: HB A1C (BAYER DCA - WAIVED): 6.7 % (ref <7.0)

## 2020-08-16 MED ORDER — OMEPRAZOLE 40 MG PO CPDR: 40.0000 mg | DELAYED_RELEASE_CAPSULE | Freq: Every day | ORAL | 3 refills | Status: DC

## 2020-08-16 NOTE — Patient Instructions (Signed)

## 2020-08-16 NOTE — Progress Notes (Signed)
Subjective:    Patient ID: Charles Beltran, male    DOB: [birth year], 70 y.o.   MRN: [id number]  Chief Complaint  Patient presents with   Follow-up   PT presents to the office today for chronic follow up.  Hypertension This is a chronic problem. The current episode started more than 1 year ago. The problem has been waxing and waning since onset. The problem is controlled. Associated symptoms include malaise/fatigue. Pertinent negatives include no peripheral edema or shortness of breath. Risk factors for coronary artery disease include dyslipidemia, obesity and sedentary lifestyle. The current treatment provides moderate improvement.  Gastroesophageal Reflux He complains of belching and heartburn. This is a chronic problem. The current episode started more than 1 year ago. The problem occurs occasionally. The problem has been waxing and waning. Risk factors include obesity. He has tried a PPI for the symptoms. The treatment provided moderate relief.  Hyperlipidemia This is a chronic problem. The current episode started more than 1 year ago. Exacerbating diseases include obesity. Pertinent negatives include no shortness of breath. Current antihyperlipidemic treatment includes statins. The current treatment provides moderate improvement of lipids. Risk factors for coronary artery disease include dyslipidemia, hypertension and male sex.  Diabetes He presents for his follow-up diabetic visit. He has type 2 diabetes mellitus. Risk factors for coronary artery disease include dyslipidemia, diabetes mellitus, male sex, hypertension and sedentary lifestyle. He is following a generally healthy diet. (Does not take glucose )     Review of Systems  Constitutional:  Positive for malaise/fatigue.  Respiratory:  Negative for shortness of breath.   Gastrointestinal:  Positive for heartburn.  All other systems reviewed and are negative.     Objective:   Physical Exam Vitals reviewed.  Constitutional:       General: He is not in acute distress.    Appearance: He is well-developed. He is obese.  HENT:     Head: Normocephalic.     Right Ear: Tympanic membrane normal.     Left Ear: Tympanic membrane normal.  Eyes:     General:        Right eye: No discharge.        Left eye: No discharge.     Pupils: Pupils are equal, round, and reactive to light.  Neck:     Thyroid: No thyromegaly.  Cardiovascular:     Rate and Rhythm: Normal rate and regular rhythm.     Heart sounds: Murmur heard.  Pulmonary:     Effort: Pulmonary effort is normal. No respiratory distress.     Breath sounds: Normal breath sounds. No wheezing.  Abdominal:     General: Bowel sounds are normal. There is no distension.     Palpations: Abdomen is soft.     Tenderness: There is no abdominal tenderness.  Musculoskeletal:        General: No tenderness. Normal range of motion.     Cervical back: Normal range of motion and neck supple.  Skin:    General: Skin is warm and dry.     Findings: No erythema or rash.  Neurological:     Mental Status: He is alert and oriented to person, place, and time.     Cranial Nerves: No cranial nerve deficit.     Deep Tendon Reflexes: Reflexes are normal and symmetric.  Psychiatric:        Behavior: Behavior normal.        Thought Content: Thought content normal.  Judgment: Judgment normal.      BP (!) 140/59   Pulse 100   Temp 98 F (36.7 C) (Temporal)   Ht '5\' 9"'  (1.753 m)   Wt 179 lb 3.2 oz (81.3 kg)   BMI 26.46 kg/m      Assessment & Plan:  Charles Beltran comes in today with chief complaint of Follow-up   Diagnosis and orders addressed:  1. Primary hypertension - CBC with Differential/Platelet - CMP14+EGFR  2. Gastroesophageal reflux disease, unspecified whether esophagitis present Will increase Omeprazole to 40 mg from 20 mg -Diet discussed- Avoid fried, spicy, citrus foods, caffeine and alcohol -Do not eat 2-3 hours before bedtime -Encouraged small frequent  meals -Avoid NSAID's - CBC with Differential/Platelet - CMP14+EGFR - omeprazole (PRILOSEC) 40 MG capsule; Take 1 capsule (40 mg total) by mouth daily.  Dispense: 90 capsule; Refill: 3  3. Prediabetes - Bayer DCA Hb A1c Waived - CBC with Differential/Platelet - CMP14+EGFR  4. Hyperlipidemia, unspecified hyperlipidemia type - CBC with Differential/Platelet - CMP14+EGFR  5. Overweight (BMI 25.0-29.9) - CBC with Differential/Platelet - CMP14+EGFR  6. Vitamin D deficiency - CBC with Differential/Platelet - CMP14+EGFR  7. Generalized abdominal pain - CBC with Differential/Platelet - CMP14+EGFR - H Pylori, IGM, IGG, IGA AB   Labs pending Health Maintenance reviewed Diet and exercise encouraged  Follow up plan: 3 months   Evelina Dun, FNP

## 2020-08-16 NOTE — Progress Notes (Signed)
Pt is returning nurse call but he has appt today with Neysa Bonito and will get results then

## 2020-08-17 ENCOUNTER — Telehealth: Payer: Self-pay | Admitting: Family

## 2020-08-18 LAB — CMP14+EGFR
ALT: 35 IU/L (ref 0–44)
AST: 27 IU/L (ref 0–40)
Albumin/Globulin Ratio: 1.8 (ref 1.2–2.2)
Albumin: 4.4 g/dL (ref 3.8–4.8)
Alkaline Phosphatase: 84 IU/L (ref 44–121)
BUN/Creatinine Ratio: 15 (ref 10–24)
BUN: 13 mg/dL (ref 8–27)
Bilirubin Total: 0.7 mg/dL (ref 0.0–1.2)
CO2: 21 mmol/L (ref 20–29)
Calcium: 10 mg/dL (ref 8.6–10.2)
Chloride: 94 mmol/L — ABNORMAL LOW (ref 96–106)
Creatinine, Ser: 0.84 mg/dL (ref 0.76–1.27)
Globulin, Total: 2.5 g/dL (ref 1.5–4.5)
Glucose: 240 mg/dL — ABNORMAL HIGH (ref 65–99)
Potassium: 4.5 mmol/L (ref 3.5–5.2)
Sodium: 134 mmol/L (ref 134–144)
Total Protein: 6.9 g/dL (ref 6.0–8.5)
eGFR: 94 mL/min/{1.73_m2} (ref >59)

## 2020-08-18 LAB — CBC WITH DIFFERENTIAL/PLATELET
Basophils Absolute: 0 10*3/uL (ref 0.0–0.2)
Basos: 0 %
EOS (ABSOLUTE): 0.1 10*3/uL (ref 0.0–0.4)
Eos: 1 %
Hematocrit: 43.1 % (ref 37.5–51.0)
Hemoglobin: 14.5 g/dL (ref 13.0–17.7)
Immature Grans (Abs): 0 10*3/uL (ref 0.0–0.1)
Immature Granulocytes: 0 %
Lymphocytes Absolute: 1 10*3/uL (ref 0.7–3.1)
Lymphs: 11 %
MCH: 29.8 pg (ref 26.6–33.0)
MCHC: 33.6 g/dL (ref 31.5–35.7)
MCV: 89 fL (ref 79–97)
Monocytes Absolute: 0.8 10*3/uL (ref 0.1–0.9)
Monocytes: 9 %
Neutrophils Absolute: 7.6 10*3/uL — ABNORMAL HIGH (ref 1.4–7.0)
Neutrophils: 79 %
Platelets: 306 10*3/uL (ref 150–450)
RBC: 4.87 x10E6/uL (ref 4.14–5.80)
RDW: 12.3 % (ref 11.6–15.4)
WBC: 9.6 10*3/uL (ref 3.4–10.8)

## 2020-08-18 LAB — H PYLORI, IGM, IGG, IGA AB
H pylori, IgM Abs: <9 units (ref 0.0–8.9)
H. pylori, IgA Abs: <9 units (ref 0.0–8.9)
H. pylori, IgG AbS: 0.36 Index Value (ref 0.00–0.79)

## 2020-08-18 NOTE — Telephone Encounter (Signed)
CALLED PATIENT, NO ANSWER, LEFT MESSAGE TO RETURN CALL 

## 2020-08-18 NOTE — Telephone Encounter (Signed)
No he does not have a stomach ulcer. He should continue the omeprazole 40 mg and I strongly recommend following up with GI.

## 2020-09-05 ENCOUNTER — Ambulatory Visit (INDEPENDENT_AMBULATORY_CARE_PROVIDER_SITE_OTHER): Payer: PPO | Admitting: Nurse Practitioner

## 2020-09-05 ENCOUNTER — Other Ambulatory Visit: Payer: Self-pay

## 2020-09-05 ENCOUNTER — Encounter: Payer: Self-pay | Admitting: Nurse Practitioner

## 2020-09-05 VITALS — BP 128/65 | HR 89 | Temp 97.4°F | Ht 69.0 in | Wt 177.4 lb

## 2020-09-05 DIAGNOSIS — H1032 Unspecified acute conjunctivitis, left eye: Secondary | ICD-10-CM

## 2020-09-05 MED ORDER — POLYMYXIN B-TRIMETHOPRIM 10000-0.1 UNIT/ML-% OP SOLN: 2.0000 [drp] | OPHTHALMIC | 0 refills | Status: DC

## 2020-09-05 NOTE — Progress Notes (Signed)
   Subjective:    Patient ID: Charles Beltran, male    DOB: 05-Jan-1951, 70 y.o.   MRN: [id number]   Chief Complaint: Eye Drainage   HPI Patient comes in c/o red eye that started yesterday. It has been watering since yesterday. Denies any injury.    Review of Systems  Constitutional: Negative.   Eyes:  Positive for pain, discharge, redness and itching. Negative for photophobia and visual disturbance.  All other systems reviewed and are negative.     Objective:   Physical Exam Vitals and nursing note reviewed.  Constitutional:      Appearance: Normal appearance.  Eyes:     General: No scleral icterus.       Right eye: Discharge present.        Left eye: Discharge present.    Extraocular Movements: Extraocular movements intact.     Pupils: Pupils are equal, round, and reactive to light.     Comments: Right scleral injection  Cardiovascular:     Rate and Rhythm: Normal rate and regular rhythm.     Heart sounds: Normal heart sounds.  Pulmonary:     Breath sounds: Normal breath sounds.  Neurological:     Mental Status: He is alert.   BP 128/65   Pulse 89   Temp (!) 97.4 F (36.3 C) (Oral)   Ht 5\' 9"  (1.753 m)   Wt 177 lb 6.4 oz (80.5 kg)   SpO2 98%   BMI 26.20 kg/m         Assessment & Plan:  in today with chief complaint of Eye Drainage and Conjunctivitis (Left eye- started yesterday)   1. Acute bacterial conjunctivitis of left eye Clean outer eye with baby shampoo Avoid rubbing eye Good handwash Meds ordered this encounter  Medications   trimethoprim-polymyxin b (POLYTRIM) ophthalmic solution    Sig: Place 2 drops into both eyes every 4 (four) hours.    Dispense:  10 mL    Refill:  0    Order Specific Question:   Supervising Provider    Answer:   [name] A [[id number]]       The above assessment and management plan was discussed with the patient. The patient verbalized understanding of and has agreed to the management plan.  Patient is aware to call the clinic if symptoms persist or worsen. Patient is aware when to return to the clinic for a follow-up visit. Patient educated on when it is appropriate to go to the emergency department.   Mary-Margaret Arville Care, FNP

## 2020-09-05 NOTE — Patient Instructions (Signed)
Bacterial Conjunctivitis, Adult Bacterial conjunctivitis is an infection of your conjunctiva. This is the clear membrane that covers the white part of your eye and the inner part of your eyelid. This infection can make your eye: Red or pink. Itchy. This condition spreads easily from person to person (is contagious) and from one eye to the other eye. What are the causes? This condition is caused by germs (bacteria). You may get the infection if you come into close contact with: A person who has the infection. Items that have germs on them (are contaminated), such as face towels, contact lens solution, or eye makeup. What increases the risk? You are more likely to get this condition if you: Have contact with people who have the infection. Wear contact lenses. Have a sinus infection. Have had a recent eye injury or surgery. Have a weak body defense system (immune system). Have dry eyes. What are the signs or symptoms?  Thick, yellowish discharge from the eye. Tearing or watery eyes. Itchy eyes. Burning feeling in your eyes. Eye redness. Swollen eyelids. Blurred vision. How is this treated?  Antibiotic eye drops or ointment. Antibiotic medicine taken by mouth. This is used for infections that do not get better with drops or ointment or that last more than 10 days. Cool, wet cloths placed on the eyes. Artificial tears used 2-6 times a day. Follow these instructions at home: Medicines Take or apply your antibiotic medicine as told by your doctor. Do not stop taking or applying the antibiotic even if you start to feel better. Take or apply over-the-counter and prescription medicines only as told by your doctor. Do not touch your eyelid with the eye-drop bottle or the ointment tube. Managing discomfort Wipe any fluid from your eye with a warm, wet washcloth or a cotton ball. Place a clean, cool, wet cloth on your eye. Do this for 10-20 minutes, 3-4 times per day. General  instructions Do not wear contacts until the infection is gone. Wear glasses until your doctor says it is okay to wear contacts again. Do not wear eye makeup until the infection is gone. Throw away old eye makeup. Change or wash your pillowcase every day. Do not share towels or washcloths. Wash your hands often with soap and water. Use paper towels to dry your hands. Do not touch or rub your eyes. Do not drive or use heavy machinery if your vision is blurred. Contact a doctor if: You have a fever. You do not get better after 10 days. Get help right away if: You have a fever and your symptoms get worse all of a sudden. You have very bad pain when you move your eye. Your face: Hurts. Is red. Is swollen. You have sudden loss of vision. Summary Bacterial conjunctivitis is an infection of your conjunctiva. This infection spreads easily from person to person. Wash your hands often with soap and water. Use paper towels to dry your hands. Take or apply your antibiotic medicine as told by your doctor. Contact a doctor if you have a fever or you do not get better after 10 days. This information is not intended to replace advice given to you by your health care provider. Make sure you discuss any questions you have with your healthcare provider. Document Revised: 12/15/2019 Document Reviewed: 08/28/2017 Elsevier Patient Education  2022 Elsevier Inc.  

## 2020-09-07 ENCOUNTER — Ambulatory Visit (INDEPENDENT_AMBULATORY_CARE_PROVIDER_SITE_OTHER): Payer: PPO | Admitting: Family Medicine

## 2020-09-07 ENCOUNTER — Encounter: Payer: Self-pay | Admitting: Family Medicine

## 2020-09-07 ENCOUNTER — Other Ambulatory Visit: Payer: Self-pay

## 2020-09-07 VITALS — BP 126/63 | HR 91 | Temp 98.2°F | Resp 20 | Ht 69.0 in | Wt 177.0 lb

## 2020-09-07 DIAGNOSIS — H1032 Unspecified acute conjunctivitis, left eye: Secondary | ICD-10-CM

## 2020-09-07 MED ORDER — TOBRAMYCIN-DEXAMETHASONE 0.3-0.1 % OP SUSP
2.0000 [drp] | OPHTHALMIC | 0 refills | Status: AC
Start: 2020-09-07 — End: 2020-09-14

## 2020-09-07 NOTE — Progress Notes (Signed)
Acute Office Visit  Subjective:    Patient ID: Charles Beltran, male    DOB: 1951-01-07, 70 y.o.   MRN: [id number]  Chief Complaint  Patient presents with   Conjunctivitis    HPI Patient is in today for redness of his left eye x 2 days. He had a visit 2 days ago with another provider for this and was given polytrim eye drops. He has been using them as prescribed. He reports that the watering and the pain has improved. He is waking up with some matting in his eye. Denies changes in vision, swelling, or pain with eye movements.   Past Medical History:  Diagnosis Date   Childhood asthma    Hyperlipidemia    Hypertension     History reviewed. No pertinent surgical history.  Family History  Problem Relation Age of Onset   Epilepsy Mother    Cancer Father        throat   Alcohol abuse Father    Heart attack Brother    Stroke Brother     Social History   Socioeconomic History   Marital status: Single    Spouse name: Not on file   Number of children: 0   Years of education: 16   Highest education level: Bachelor's degree (e.g., BA, AB, BS)  Occupational History   Occupation: Retired  Tobacco Use   Smoking status: Never   Smokeless tobacco: Never  Vaping Use   Vaping Use: Never used  Substance and Sexual Activity   Alcohol use: No   Drug use: No   Sexual activity: Not Currently  Other Topics Concern   Not on file  Social History Narrative   Lives alone in an apartment with an Media planner. Brother lives nearby, but most of family passed or lives out of state. No children, never married.   Social Determinants of Health   Financial Resource Strain: Low Risk    Difficulty of Paying Living Expenses: Not hard at all  Food Insecurity: No Food Insecurity   Worried About Charity fundraiser in the Last Year: Never true   [place] in the Last Year: Never true  Transportation Needs: No Transportation Needs   Lack of Transportation (Medical): No   Lack of  Transportation (Non-Medical): No  Physical Activity: Inactive   Days of Exercise per Week: 0 days   Minutes of Exercise per Session: 0 min  Stress: No Stress Concern Present   Feeling of Stress : Not at all  Social Connections: Socially Isolated   Frequency of Communication with Friends and Family: Twice a week   Frequency of Social Gatherings with Friends and Family: Once a week   Attends Religious Services: Never   Marine scientist or Organizations: No   Attends Music therapist: Never   Marital Status: Never married  Human resources officer Violence: Not At Risk   Fear of Current or Ex-Partner: No   Emotionally Abused: No   Physically Abused: No   Sexually Abused: No    Outpatient Medications Prior to Visit  Medication Sig Dispense Refill   amLODipine (NORVASC) 10 MG tablet Take 1 tablet (10 mg total) by mouth daily. 90 tablet 3   atorvastatin (LIPITOR) 40 MG tablet TAKE 1 TABLET BY MOUTH EVERY DAY 90 tablet 3   Cholecalciferol (VITAMIN D) 2000 UNITS tablet Take 2,000 Units by mouth daily.     fish oil-omega-3 fatty acids 1000 MG capsule Take 1 g by mouth. One  bid     metFORMIN (GLUCOPHAGE XR) 500 MG 24 hr tablet Take 1 tablet (500 mg total) by mouth daily with breakfast. 90 tablet 3   omeprazole (PRILOSEC) 40 MG capsule Take 1 capsule (40 mg total) by mouth daily. 90 capsule 3   trimethoprim-polymyxin b (POLYTRIM) ophthalmic solution Place 2 drops into both eyes every 4 (four) hours. 10 mL 0   No facility-administered medications prior to visit.    Allergies  Allergen Reactions   Penicillins Shortness Of Breath    Review of Systems As per HPI.     Objective:    Physical Exam Vitals and nursing note reviewed.  Constitutional:      General: He is not in acute distress.    Appearance: He is not toxic-appearing or diaphoretic.  Eyes:     General: Lids are normal. Gaze aligned appropriately.        Left eye: Discharge (yellow) present.No hordeolum.      Extraocular Movements:     Left eye: Normal extraocular motion.     Conjunctiva/sclera:     Left eye: Left conjunctiva is injected.  Pulmonary:     Effort: Pulmonary effort is normal. No respiratory distress.  Musculoskeletal:     Right lower leg: No edema.     Left lower leg: No edema.  Skin:    General: Skin is warm.  Neurological:     General: No focal deficit present.     Mental Status: He is alert and oriented to person, place, and time.  Psychiatric:        Mood and Affect: Mood normal.        Behavior: Behavior normal.    BP 126/63   Pulse 91   Temp 98.2 F (36.8 C) (Temporal)   Resp 20   Ht '5\' 9"'  (1.753 m)   Wt 177 lb (80.3 kg)   SpO2 98%   BMI 26.14 kg/m  Wt Readings from Last 3 Encounters:  09/07/20 177 lb (80.3 kg)  09/05/20 177 lb 6.4 oz (80.5 kg)  08/16/20 179 lb 3.2 oz (81.3 kg)    Health Maintenance Due  Topic Date Due   Zoster Vaccines- Shingrix (1 of 2) Never done   INFLUENZA VACCINE  09/05/2020    There are no preventive care reminders to display for this patient.   Lab Results  Component Value Date   TSH 2.200 10/16/2019   Lab Results  Component Value Date   WBC 9.6 08/16/2020   HGB 14.5 08/16/2020   HCT 43.1 08/16/2020   MCV 89 08/16/2020   PLT 306 08/16/2020   Lab Results  Component Value Date   NA 134 08/16/2020   K 4.5 08/16/2020   CO2 21 08/16/2020   GLUCOSE 240 (H) 08/16/2020   BUN 13 08/16/2020   CREATININE 0.84 08/16/2020   BILITOT 0.7 08/16/2020   ALKPHOS 84 08/16/2020   AST 27 08/16/2020   ALT 35 08/16/2020   PROT 6.9 08/16/2020   ALBUMIN 4.4 08/16/2020   CALCIUM 10.0 08/16/2020   ANIONGAP 9 07/27/2020   EGFR 94 08/16/2020   Lab Results  Component Value Date   CHOL 129 10/16/2019   Lab Results  Component Value Date   HDL 46 10/16/2019   Lab Results  Component Value Date   LDLCALC 67 10/16/2019   Lab Results  Component Value Date   TRIG 81 10/16/2019   Lab Results  Component Value Date   CHOLHDL  2.8 10/16/2019   Lab Results  Component Value Date   HGBA1C 6.7 08/16/2020       Assessment & Plan:   Karan was seen today for conjunctivitis.  Diagnoses and all orders for this visit:  Acute bacterial conjunctivitis of left eye D/c polytrim and change to tobradex as below. Discussed return precautions and prevention of spread.  -     tobramycin-dexamethasone (TOBRADEX) ophthalmic solution; Place 2 drops into the left eye every 4 (four) hours while awake for 7 days.  Return to office for new or worsening symptoms, or if symptoms persist.   The patient indicates understanding of these issues and agrees with the plan.  Gwenlyn Perking, FNP

## 2020-09-07 NOTE — Patient Instructions (Signed)
Allergic Conjunctivitis, Adult ?Allergic conjunctivitis is inflammation of the conjunctiva. The conjunctiva is the thin, clear membrane that covers the white part of the eye and the inner surface of the eyelid. In this condition: ?The blood vessels in the conjunctiva become irritated and swell. ?The eyes become red or pink and feel itchy. ?Allergic conjunctivitis cannot be spread from person to person. This condition can develop at any age and may be outgrown. ?What are the causes? ?This condition is caused by allergens. These are things that can cause an allergic reaction in some people but not in other people. Common allergens include: ?Outdoor allergens, such as: ?Pollen, including pollen from grass and weeds. ?Mold spores. ?Car fumes. ?Indoor allergens, such as: ?Dust. ?Smoke. ?Mold spores. ?Proteins in a pet's urine, saliva, or dander. ?What increases the risk? ?You may be more likely to develop this condition if you have a family history of these things: ?Allergies. ?Conditions caused by being exposed to allergens, such as: ?Allergic rhinitis. This is an allergic reaction that affects the nose. ?Bronchial asthma. This condition affects the large airways in the lungs and makes breathing difficult. ?Atopic dermatitis (eczema). This is inflammation of the skin that is long-term (chronic). ?What are the signs or symptoms? ?Symptoms of this condition include eyes that are: ?Itchy. ?Red. ?Watery. ?Puffy. ?Your eyes may also: ?Sting or burn. ?Have clear fluid draining from them. ?Have thick mucus discharge and pain (vernal conjunctivitis). ?How is this diagnosed? ?This condition may be diagnosed by: ?Your medical history. ?A physical exam. ?Tests of the fluid draining from your eyes to rule out other causes. ?Other tests to confirm the diagnosis, including: ?Testing for allergies. The skin may be pricked with a tiny needle. The pricked area is then exposed to small amounts of allergens. ?Testing for other eye  conditions. Tests may include: ?Blood tests. ?Tissue scrapings from your eyelid. The tissue is then checked under a microscope. ?How is this treated? ?This condition may be treated with: ?Cold, wet cloths (cold compresses) to soothe itching and swelling. ?Washing the face to remove allergens. ?Eye drops. These may be prescription or over-the-counter. You may need to try different types to see which one works best for you, such as: ?Eye drops that block the allergic reaction (antihistamine). ?Eye drops that reduce swelling and irritation (anti-inflammatory). ?Steroid eye drops, which may be given if other treatments have not worked (vernal conjunctivitis). ?Oral antihistamine medicines. These are medicines taken by mouth to lessen your allergic reaction. You may need these if eye drops do not help or are difficult to use. ?Follow these instructions at home: ?Eye care ?Apply a clean, cold compress to your eyes for 10-20 minutes, 3-4 times a day. ?Do not touch or rub your eyes. ?Do not wear contact lenses until the inflammation is gone. Wear glasses instead. ?Do not wear eye makeup until the inflammation is gone. ?General instructions ?Avoid known allergens whenever possible. ?Take or apply over-the-counter and prescription medicines only as told by your health care provider. These include any eye drops. ?Drink enough fluid to keep your urine pale yellow. ?Keep all follow-up visits as told by your health care provider. This is important. ?Contact a health care provider if: ?Your symptoms get worse or do not get better with treatment. ?You have mild eye pain. ?You become sensitive to light. ?You have spots or blisters on your eyes. ?You have pus draining from your eyes. ?You have a fever. ?Get help right away if: ?You have redness, swelling, or other symptoms in   only one eye. ?Your vision is blurred or you have other vision changes. ?You have severe eye pain. ?Summary ?Allergic conjunctivitis is inflammation of the  clear membrane that covers the white part of the eye and the inner surface of the eyelid. ?Take or apply over-the-counter and prescription medicines only as told by your health care provider. These include eye drops. ?Do not touch or rub your eyes. ?Contact a health care provider if your symptoms get worse or do not get better with treatment. ?This information is not intended to replace advice given to you by your health care provider. Make sure you discuss any questions you have with your health care provider. ?Document Revised: 12/15/2018 Document Reviewed: 12/15/2018 ?Elsevier Patient Education ? 2022 Elsevier Inc. ? ?

## 2020-09-30 ENCOUNTER — Encounter: Payer: Self-pay | Admitting: Family

## 2020-09-30 ENCOUNTER — Ambulatory Visit (INDEPENDENT_AMBULATORY_CARE_PROVIDER_SITE_OTHER): Payer: PPO | Admitting: Family

## 2020-09-30 ENCOUNTER — Other Ambulatory Visit: Payer: Self-pay

## 2020-09-30 VITALS — BP 140/61 | HR 80 | Temp 98.1°F | Ht 69.0 in | Wt 181.4 lb

## 2020-09-30 DIAGNOSIS — R609 Edema, unspecified: Secondary | ICD-10-CM

## 2020-09-30 DIAGNOSIS — R21 Rash and other nonspecific skin eruption: Secondary | ICD-10-CM | POA: Diagnosis not present

## 2020-09-30 DIAGNOSIS — I10 Essential (primary) hypertension: Secondary | ICD-10-CM | POA: Diagnosis not present

## 2020-09-30 MED ORDER — PREDNISONE 10 MG (21) PO TBPK
ORAL_TABLET | ORAL | 0 refills | Status: DC
Start: 2020-09-30 — End: 2020-10-06

## 2020-09-30 NOTE — Patient Instructions (Signed)
Peripheral Edema Peripheral edema is swelling that is caused by a buildup of fluid. Peripheral edema most often affects the lower legs, ankles, and feet. It can also develop in the arms, hands, and face. The area of the body that has peripheral edema will look swollen. It may also feel heavy or warm. Your clothes may start to feel tight. Pressing on the area may make a temporary dent in your skin. You may not be able to move your swollen arm or leg as much as usual. There are many causes of peripheral edema. It can happen because of a complication of other conditions such as congestive heart failure, kidney disease, or a problem with your blood circulation. It also can be a side effect of certain medicines or because of an infection. It often happens to women during pregnancy. Sometimes, the cause is not known. Follow these instructions at home: Managing pain, stiffness, and swelling  Raise (elevate) your legs while you are sitting or lying down. Move around often to prevent stiffness and to lessen swelling. Do not sit or stand for long periods of time. Wear support stockings as told by your health care provider. Medicines Take over-the-counter and prescription medicines only as told by your health care provider. Your health care provider may prescribe medicine to help your body get rid of excess water (diuretic). General instructions Pay attention to any changes in your symptoms. Follow instructions from your health care provider about limiting salt (sodium) in your diet. Sometimes, eating less salt may reduce swelling. Moisturize skin daily to help prevent skin from cracking and draining. Keep all follow-up visits as told by your health care provider. This is important. Contact a health care provider if you have: A fever. Edema that starts suddenly or is getting worse, especially if you are pregnant or have a medical condition. Swelling in only one leg. Increased swelling, redness, or pain in  one or both of your legs. Drainage or sores at the area where you have edema. Get help right away if you: Develop shortness of breath, especially when you are lying down. Have pain in your chest or abdomen. Feel weak. Feel faint. Summary Peripheral edema is swelling that is caused by a buildup of fluid. Peripheral edema most often affects the lower legs, ankles, and feet. Move around often to prevent stiffness and to lessen swelling. Do not sit or stand for long periods of time. Pay attention to any changes in your symptoms. Contact a health care provider if you have edema that starts suddenly or is getting worse, especially if you are pregnant or have a medical condition. Get help right away if you develop shortness of breath, especially when lying down. This information is not intended to replace advice given to you by your health care provider. Make sure you discuss any questions you have with your health care provider. Document Revised: 10/16/2017 Document Reviewed: 10/16/2017 Elsevier Patient Education  2022 Elsevier Inc.  

## 2020-09-30 NOTE — Progress Notes (Signed)
Subjective:    Patient ID: Charles Beltran, male    DOB: 06-Jan-1951, 70 y.o.   MRN: [id number]  Chief Complaint  Patient presents with   Foot Swelling    Both feet and ankles    Pt presents to the office today with bilateral leg rash and mild swelling.  Rash This is a new problem. The current episode started 1 to 4 weeks ago. The problem is unchanged. The affected locations include the left lower leg and right lower leg. The rash is characterized by redness. He was exposed to nothing. Pertinent negatives include no cough, eye pain, fatigue, fever, shortness of breath, sore throat or vomiting. Past treatments include nothing. The treatment provided mild relief.  Hypertension This is a chronic problem. The current episode started more than 1 year ago. The problem has been waxing and waning since onset. The problem is controlled. Associated symptoms include malaise/fatigue and peripheral edema. Pertinent negatives include no shortness of breath.     Review of Systems  Constitutional:  Positive for malaise/fatigue. Negative for fatigue and fever.  HENT:  Negative for sore throat.   Eyes:  Negative for pain.  Respiratory:  Negative for cough and shortness of breath.   Gastrointestinal:  Negative for vomiting.  Skin:  Positive for rash.  All other systems reviewed and are negative.     Objective:   Physical Exam Vitals reviewed.  Constitutional:      General: He is not in acute distress.    Appearance: He is well-developed.  HENT:     Head: Normocephalic.  Eyes:     General:        Right eye: No discharge.        Left eye: No discharge.     Pupils: Pupils are equal, round, and reactive to light.  Neck:     Thyroid: No thyromegaly.  Cardiovascular:     Rate and Rhythm: Normal rate and regular rhythm.     Heart sounds: Normal heart sounds. No murmur heard. Pulmonary:     Effort: Pulmonary effort is normal. No respiratory distress.     Breath sounds: Normal breath sounds. No  wheezing.  Abdominal:     General: Bowel sounds are normal. There is no distension.     Palpations: Abdomen is soft.     Tenderness: There is no abdominal tenderness.  Musculoskeletal:        General: No tenderness. Normal range of motion.     Cervical back: Normal range of motion and neck supple.     Right lower leg: Edema (2) present.     Left lower leg: Edema (2) present.  Skin:    General: Skin is warm and dry.     Findings: Erythema (generalized macule erythemas rash) present. No rash.  Neurological:     Mental Status: He is alert and oriented to person, place, and time.     Cranial Nerves: No cranial nerve deficit.     Deep Tendon Reflexes: Reflexes are normal and symmetric.  Psychiatric:        Behavior: Behavior normal.        Thought Content: Thought content normal.        Judgment: Judgment normal.      BP 140/61   Pulse 80   Temp 98.1 F (36.7 C) (Temporal)   Ht 5\' 9"  (1.753 m)   Wt 181 lb 6.4 oz (82.3 kg)   SpO2 96%   BMI 26.79 kg/m  Assessment & Plan:  Charles Beltran comes in today with chief complaint of Foot Swelling (Both feet and ankles )  1. Rash and nonspecific skin eruption - predniSONE (STERAPRED UNI-PAK 21 TAB) 10 MG (21) TBPK tablet; Use as directed  Dispense: 21 tablet; Refill: 0  2. Peripheral edema - predniSONE (STERAPRED UNI-PAK 21 TAB) 10 MG (21) TBPK tablet; Use as directed  Dispense: 21 tablet; Refill: 0  3. Primary hypertension  Start prednisone  Low salt diet Force fluids Keep legs elevated Compression hose   Charles Rodney, FNP

## 2020-10-05 ENCOUNTER — Ambulatory Visit (INDEPENDENT_AMBULATORY_CARE_PROVIDER_SITE_OTHER): Payer: PPO | Admitting: Nurse Practitioner

## 2020-10-05 ENCOUNTER — Encounter: Payer: Self-pay | Admitting: Nurse Practitioner

## 2020-10-05 VITALS — BP 149/68 | HR 90 | Temp 98.3°F | Ht 69.0 in | Wt 179.0 lb

## 2020-10-05 DIAGNOSIS — M65321 Trigger finger, right index finger: Secondary | ICD-10-CM | POA: Diagnosis not present

## 2020-10-05 MED ORDER — IBUPROFEN 600 MG PO TABS: 600.0000 mg | ORAL_TABLET | Freq: Three times a day (TID) | ORAL | 0 refills | Status: DC | PRN

## 2020-10-05 NOTE — Assessment & Plan Note (Signed)
New symptoms of trigger finger in the last few hours, symptoms not well controlled, patient is not experiencing tingling or pain, finger is slightly bent and mildly tender when palpated.  Encourage patient to apply warm compress, anti-inflammatory as needed for pain,  watch and wait follow-up with unresolved symptoms.

## 2020-10-05 NOTE — Progress Notes (Signed)
Acute Office Visit  Subjective:    Patient ID: Charles Beltran, male    DOB: 01-17-1951, 70 y.o.   MRN: [id number]  No chief complaint on file.   HPI Patient is in today for Trigger Finger: Patient complaints of right Index finger  catching. This has been present a few hours however recently has become progressively more aggravating. This affects most every activity involving gripping. Originally only a catching sensation was present, however now catching is present. He presents today for evaluation  Treatment to date has been nothing, with significant relief.   Past Medical History:  Diagnosis Date   Childhood asthma    Hyperlipidemia    Hypertension     History reviewed. No pertinent surgical history.  Family History  Problem Relation Age of Onset   Epilepsy Mother    Cancer Father        throat   Alcohol abuse Father    Heart attack Brother    Stroke Brother     Social History   Socioeconomic History   Marital status: Single    Spouse name: Not on file   Number of children: 0   Years of education: 16   Highest education level: Bachelor's degree (e.g., BA, AB, BS)  Occupational History   Occupation: Retired  Tobacco Use   Smoking status: Never   Smokeless tobacco: Never  Vaping Use   Vaping Use: Never used  Substance and Sexual Activity   Alcohol use: No   Drug use: No   Sexual activity: Not Currently  Other Topics Concern   Not on file  Social History Narrative   Lives alone in an apartment with an Media planner. Brother lives nearby, but most of family passed or lives out of state. No children, never married.   Social Determinants of Health   Financial Resource Strain: Low Risk    Difficulty of Paying Living Expenses: Not hard at all  Food Insecurity: No Food Insecurity   Worried About Charity fundraiser in the Last Year: Never true   [place] in the Last Year: Never true  Transportation Needs: No Transportation Needs   Lack of Transportation  (Medical): No   Lack of Transportation (Non-Medical): No  Physical Activity: Inactive   Days of Exercise per Week: 0 days   Minutes of Exercise per Session: 0 min  Stress: No Stress Concern Present   Feeling of Stress : Not at all  Social Connections: Socially Isolated   Frequency of Communication with Friends and Family: Twice a week   Frequency of Social Gatherings with Friends and Family: Once a week   Attends Religious Services: Never   Marine scientist or Organizations: No   Attends Music therapist: Never   Marital Status: Never married  Human resources officer Violence: Not At Risk   Fear of Current or Ex-Partner: No   Emotionally Abused: No   Physically Abused: No   Sexually Abused: No    Outpatient Medications Prior to Visit  Medication Sig Dispense Refill   amLODipine (NORVASC) 10 MG tablet Take 1 tablet (10 mg total) by mouth daily. 90 tablet 3   atorvastatin (LIPITOR) 40 MG tablet TAKE 1 TABLET BY MOUTH EVERY DAY 90 tablet 3   Cholecalciferol (VITAMIN D) 2000 UNITS tablet Take 2,000 Units by mouth daily.     fish oil-omega-3 fatty acids 1000 MG capsule Take 1 g by mouth. One bid     metFORMIN (GLUCOPHAGE XR) 500 MG  24 hr tablet Take 1 tablet (500 mg total) by mouth daily with breakfast. 90 tablet 3   omeprazole (PRILOSEC) 40 MG capsule Take 1 capsule (40 mg total) by mouth daily. 90 capsule 3   predniSONE (STERAPRED UNI-PAK 21 TAB) 10 MG (21) TBPK tablet Use as directed 21 tablet 0   No facility-administered medications prior to visit.    Allergies  Allergen Reactions   Penicillins Shortness Of Breath    Review of Systems  Constitutional: Negative.   HENT: Negative.    Eyes: Negative.   Respiratory: Negative.    Cardiovascular: Negative.   Gastrointestinal: Negative.   Musculoskeletal:  Positive for joint swelling.  Skin:  Negative for rash.  All other systems reviewed and are negative.     Objective:    Physical Exam Vitals and nursing  note reviewed.  Constitutional:      Appearance: Normal appearance. He is normal weight.  HENT:     Head: Normocephalic.     Nose: Nose normal.  Eyes:     Conjunctiva/sclera: Conjunctivae normal.  Cardiovascular:     Rate and Rhythm: Normal rate and regular rhythm.     Pulses: Normal pulses.     Heart sounds: Normal heart sounds.  Pulmonary:     Effort: Pulmonary effort is normal.     Breath sounds: Normal breath sounds.  Abdominal:     General: Bowel sounds are normal.  Musculoskeletal:     Right hand: Swelling and tenderness present. Decreased range of motion. Normal sensation.       Arms:     Comments:    Skin:    Findings: No rash.  Neurological:     Mental Status: He is oriented to person, place, and time.  Psychiatric:        Behavior: Behavior normal.    BP (!) 149/68   Pulse 90   Temp 98.3 F (36.8 C) (Temporal)   Ht '5\' 9"'  (1.753 m)   Wt 179 lb (81.2 kg)   SpO2 96%   BMI 26.43 kg/m  Wt Readings from Last 3 Encounters:  10/05/20 179 lb (81.2 kg)  09/30/20 181 lb 6.4 oz (82.3 kg)  09/07/20 177 lb (80.3 kg)    Health Maintenance Due  Topic Date Due   Zoster Vaccines- Shingrix (1 of 2) Never done   INFLUENZA VACCINE  09/05/2020    There are no preventive care reminders to display for this patient.   Lab Results  Component Value Date   TSH 2.200 10/16/2019   Lab Results  Component Value Date   WBC 9.6 08/16/2020   HGB 14.5 08/16/2020   HCT 43.1 08/16/2020   MCV 89 08/16/2020   PLT 306 08/16/2020   Lab Results  Component Value Date   NA 134 08/16/2020   K 4.5 08/16/2020   CO2 21 08/16/2020   GLUCOSE 240 (H) 08/16/2020   BUN 13 08/16/2020   CREATININE 0.84 08/16/2020   BILITOT 0.7 08/16/2020   ALKPHOS 84 08/16/2020   AST 27 08/16/2020   ALT 35 08/16/2020   PROT 6.9 08/16/2020   ALBUMIN 4.4 08/16/2020   CALCIUM 10.0 08/16/2020   ANIONGAP 9 07/27/2020   EGFR 94 08/16/2020   Lab Results  Component Value Date   CHOL 129 10/16/2019    Lab Results  Component Value Date   HDL 46 10/16/2019   Lab Results  Component Value Date   LDLCALC 67 10/16/2019   Lab Results  Component Value Date   TRIG  81 10/16/2019   Lab Results  Component Value Date   CHOLHDL 2.8 10/16/2019   Lab Results  Component Value Date   HGBA1C 6.7 08/16/2020       Assessment & Plan:   Problem List Items Addressed This Visit       Musculoskeletal and Integument   Trigger index finger of right hand - Primary    New symptoms of trigger finger in the last few hours, symptoms not well controlled, patient is not experiencing tingling or pain, finger is slightly bent and mildly tender when palpated.  Encourage patient to apply warm compress, anti-inflammatory as needed for pain,  watch and wait follow-up with unresolved symptoms.      Relevant Medications   ibuprofen (ADVIL) 600 MG tablet     Meds ordered this encounter  Medications   ibuprofen (ADVIL) 600 MG tablet    Sig: Take 1 tablet (600 mg total) by mouth every 8 (eight) hours as needed.    Dispense:  30 tablet    Refill:  0    Order Specific Question:   Supervising Provider    Answer:   Janora Norlander [[id number]]     Ivy Lynn, NP

## 2020-10-05 NOTE — Progress Notes (Signed)
Acute Office Visit  Subjective:    Patient ID: Charles Beltran, male    DOB: [birth year], 70 y.o.   MRN: [id number]  Chief Complaint  Patient presents with   Trigger Finger    HPI Patient is in today for Trigger Finger: Patient complaints of right Middle finger  catching. This has been present a few hours however recently has become progressively more aggravating. This affects most every activity involving gripping, including . Originally only a catching sensation was present, however now catching is present. He presents today for evaluation  Treatment to date has been ice, without significant relief.   Past Medical History:  Diagnosis Date   Childhood asthma    Hyperlipidemia    Hypertension     History reviewed. No pertinent surgical history.  Family History  Problem Relation Age of Onset   Epilepsy Mother    Cancer Father        throat   Alcohol abuse Father    Heart attack Brother    Stroke Brother     Social History   Socioeconomic History   Marital status: Single    Spouse name: Not on file   Number of children: 0   Years of education: 16   Highest education level: Bachelor's degree (e.g., BA, AB, BS)  Occupational History   Occupation: Retired  Tobacco Use   Smoking status: Never   Smokeless tobacco: Never  Vaping Use   Vaping Use: Never used  Substance and Sexual Activity   Alcohol use: No   Drug use: No   Sexual activity: Not Currently  Other Topics Concern   Not on file  Social History Narrative   Lives alone in an apartment with an Media planner. Brother lives nearby, but most of family passed or lives out of state. No children, never married.   Social Determinants of Health   Financial Resource Strain: Low Risk    Difficulty of Paying Living Expenses: Not hard at all  Food Insecurity: No Food Insecurity   Worried About Charity fundraiser in the Last Year: Never true   [place] in the Last Year: Never true  Transportation Needs: No  Transportation Needs   Lack of Transportation (Medical): No   Lack of Transportation (Non-Medical): No  Physical Activity: Inactive   Days of Exercise per Week: 0 days   Minutes of Exercise per Session: 0 min  Stress: No Stress Concern Present   Feeling of Stress : Not at all  Social Connections: Socially Isolated   Frequency of Communication with Friends and Family: Twice a week   Frequency of Social Gatherings with Friends and Family: Once a week   Attends Religious Services: Never   Marine scientist or Organizations: No   Attends Music therapist: Never   Marital Status: Never married  Human resources officer Violence: Not At Risk   Fear of Current or Ex-Partner: No   Emotionally Abused: No   Physically Abused: No   Sexually Abused: No    Outpatient Medications Prior to Visit  Medication Sig Dispense Refill   amLODipine (NORVASC) 10 MG tablet Take 1 tablet (10 mg total) by mouth daily. 90 tablet 3   atorvastatin (LIPITOR) 40 MG tablet TAKE 1 TABLET BY MOUTH EVERY DAY 90 tablet 3   Cholecalciferol (VITAMIN D) 2000 UNITS tablet Take 2,000 Units by mouth daily.     fish oil-omega-3 fatty acids 1000 MG capsule Take 1 g by mouth. One bid  metFORMIN (GLUCOPHAGE XR) 500 MG 24 hr tablet Take 1 tablet (500 mg total) by mouth daily with breakfast. 90 tablet 3   omeprazole (PRILOSEC) 40 MG capsule Take 1 capsule (40 mg total) by mouth daily. 90 capsule 3   predniSONE (STERAPRED UNI-PAK 21 TAB) 10 MG (21) TBPK tablet Use as directed 21 tablet 0   No facility-administered medications prior to visit.    Allergies  Allergen Reactions   Penicillins Shortness Of Breath    Review of Systems  Constitutional: Negative.   HENT: Negative.    Eyes: Negative.   Respiratory: Negative.    Gastrointestinal: Negative.   Musculoskeletal:  Positive for joint swelling.  All other systems reviewed and are negative.     Objective:    Physical Exam Vitals and nursing note  reviewed.  Constitutional:      Appearance: Normal appearance. He is normal weight.  HENT:     Head: Normocephalic.  Eyes:     Conjunctiva/sclera: Conjunctivae normal.  Cardiovascular:     Rate and Rhythm: Normal rate and regular rhythm.     Pulses: Normal pulses.     Heart sounds: Normal heart sounds.  Pulmonary:     Effort: Pulmonary effort is normal.     Breath sounds: Normal breath sounds.  Abdominal:     General: Bowel sounds are normal.  Musculoskeletal:     Right hand: Tenderness present. Decreased range of motion.       Arms:  Skin:    Findings: No rash.  Neurological:     Mental Status: He is alert and oriented to person, place, and time.  Psychiatric:        Behavior: Behavior normal.    BP (!) 149/68   Pulse 90   Temp 98.3 F (36.8 C) (Temporal)   Ht '5\' 9"'  (1.753 m)   Wt 179 lb (81.2 kg)   SpO2 96%   BMI 26.43 kg/m  Wt Readings from Last 3 Encounters:  10/05/20 179 lb (81.2 kg)  09/30/20 181 lb 6.4 oz (82.3 kg)  09/07/20 177 lb (80.3 kg)    Health Maintenance Due  Topic Date Due   Zoster Vaccines- Shingrix (1 of 2) Never done   INFLUENZA VACCINE  09/05/2020    There are no preventive care reminders to display for this patient.   Lab Results  Component Value Date   TSH 2.200 10/16/2019   Lab Results  Component Value Date   WBC 9.6 08/16/2020   HGB 14.5 08/16/2020   HCT 43.1 08/16/2020   MCV 89 08/16/2020   PLT 306 08/16/2020   Lab Results  Component Value Date   NA 134 08/16/2020   K 4.5 08/16/2020   CO2 21 08/16/2020   GLUCOSE 240 (H) 08/16/2020   BUN 13 08/16/2020   CREATININE 0.84 08/16/2020   BILITOT 0.7 08/16/2020   ALKPHOS 84 08/16/2020   AST 27 08/16/2020   ALT 35 08/16/2020   PROT 6.9 08/16/2020   ALBUMIN 4.4 08/16/2020   CALCIUM 10.0 08/16/2020   ANIONGAP 9 07/27/2020   EGFR 94 08/16/2020   Lab Results  Component Value Date   CHOL 129 10/16/2019   Lab Results  Component Value Date   HDL 46 10/16/2019   Lab  Results  Component Value Date   LDLCALC 67 10/16/2019   Lab Results  Component Value Date   TRIG 81 10/16/2019   Lab Results  Component Value Date   CHOLHDL 2.8 10/16/2019   Lab Results  Component  Value Date   HGBA1C 6.7 08/16/2020       Assessment & Plan:   Problem List Items Addressed This Visit       Musculoskeletal and Integument   Trigger index finger of right hand - Primary    New symptoms of trigger finger in the last few hours, symptoms not well controlled, patient is not experiencing tingling or pain, finger is slightly bent and mildly tender when palpated.  Encourage patient to apply warm compress, anti-inflammatory as needed for pain,  watch and wait follow-up with unresolved symptoms.      Relevant Medications   ibuprofen (ADVIL) 600 MG tablet     Meds ordered this encounter  Medications   ibuprofen (ADVIL) 600 MG tablet    Sig: Take 1 tablet (600 mg total) by mouth every 8 (eight) hours as needed.    Dispense:  30 tablet    Refill:  0    Order Specific Question:   Supervising Provider    Answer:   Janora Norlander [[id number]]     Ivy Lynn, NP

## 2020-10-05 NOTE — Patient Instructions (Signed)
Trigger Finger  Trigger finger, also called stenosing tenosynovitis,  is a condition that causes a finger to get stuck in a bent position. Each finger has a tendon, which is a tough, cord-like tissue that connects muscle tobone, and each tendon passes through a tunnel of tissue called a tendon sheath. To move your finger, your tendon needs to glide freely through the sheath. Trigger finger happens when the tendon or the sheath thickens, making it difficult to move your finger. Trigger finger can affect any finger or a thumb. It may affect more than one finger. Mild cases may clear up with rest andmedicine. Severe cases require more treatment. What are the causes? Trigger finger is caused by a thickened finger tendon or tendon sheath. Thecause of this thickening is not known. What increases the risk? The following factors may make you more likely to develop this condition: Doing activities that require a strong grip. Having rheumatoid arthritis, gout, or diabetes. Being 40-60 years old. Being male. What are the signs or symptoms? Symptoms of this condition include: Pain when bending or straightening your finger. Tenderness or swelling where your finger attaches to the palm of your hand. A lump in the palm of your hand or on the inside of your finger. Hearing a noise like a pop or a snap when you try to straighten your finger. Feeling a catching or locking sensation when you try to straighten your finger. Being unable to straighten your finger. How is this diagnosed? This condition is diagnosed based on your symptoms and a physical exam. How is this treated? This condition may be treated by: Resting your finger and avoiding activities that make symptoms worse. Wearing a finger splint to keep your finger extended. Taking NSAIDs, such as ibuprofen, to relieve pain and swelling. Doing gentle exercises to stretch the finger as told by your health care provider. Having medicine that reduces  swelling and inflammation (steroids) injected into the tendon sheath. Injections may need to be repeated. Having surgery to open the tendon sheath. This may be done if other treatments do not work and you cannot straighten your finger. You may need physical therapy after surgery. Follow these instructions at home: If you have a splint: Wear the splint as told by your health care provider. Remove it only as told by your health care provider. Loosen it if your fingers tingle, become numb, or turn cold and blue. Keep it clean. If the splint is not waterproof: Do not let it get wet. Cover it with a watertight covering when you take a bath or shower. Managing pain, stiffness, and swelling     If directed, apply heat to the affected area as often as told by your health care provider. Use the heat source that your health care provider recommends, such as a moist heat pack or a heating pad. Place a towel between your skin and the heat source. Leave the heat on for 20-30 minutes. Remove the heat if your skin turns bright red. This is especially important if you are unable to feel pain, heat, or cold. You may have a greater risk of getting burned. If directed, put ice on the painful area. To do this: If you have a removable splint, remove it as told by your health care provider. Put ice in a plastic bag. Place a towel between your skin and the bag or between your splint and the bag. Leave the ice on for 20 minutes, 2-3 times a day.  Activity Rest your finger as   told by your health care provider. Avoid activities that make the pain worse. Return to your normal activities as told by your health care provider. Ask your health care provider what activities are safe for you. Do exercises as told by your health care provider. Ask your health care provider when it is safe to drive if you have a splint on your hand. General instructions Take over-the-counter and prescription medicines only as told by  your health care provider. Keep all follow-up visits as told by your health care provider. This is important. Contact a health care provider if: Your symptoms are not improving with home care. Summary Trigger finger, also called stenosing tenosynovitis, causes your finger to get stuck in a bent position. This can make it difficult and painful to straighten your finger. This condition develops when a finger tendon or tendon sheath thickens. Treatment may include resting your finger, wearing a splint, and taking medicines. In severe cases, surgery to open the tendon sheath may be needed. This information is not intended to replace advice given to you by your health care provider. Make sure you discuss any questions you have with your healthcare provider. Document Revised: 06/09/2018 Document Reviewed: 06/09/2018 Elsevier Patient Education  2022 [employer] Inc[employer]  

## 2020-10-06 ENCOUNTER — Encounter: Payer: Self-pay | Admitting: Family Medicine

## 2020-10-06 ENCOUNTER — Other Ambulatory Visit: Payer: Self-pay

## 2020-10-06 ENCOUNTER — Ambulatory Visit (INDEPENDENT_AMBULATORY_CARE_PROVIDER_SITE_OTHER): Payer: PPO | Admitting: Family Medicine

## 2020-10-06 ENCOUNTER — Ambulatory Visit (INDEPENDENT_AMBULATORY_CARE_PROVIDER_SITE_OTHER): Payer: PPO

## 2020-10-06 VITALS — BP 154/61 | HR 99 | Temp 98.7°F | Ht 69.0 in | Wt 178.8 lb

## 2020-10-06 DIAGNOSIS — I10 Essential (primary) hypertension: Secondary | ICD-10-CM

## 2020-10-06 DIAGNOSIS — M20011 Mallet finger of right finger(s): Secondary | ICD-10-CM

## 2020-10-06 DIAGNOSIS — R609 Edema, unspecified: Secondary | ICD-10-CM

## 2020-10-06 DIAGNOSIS — R21 Rash and other nonspecific skin eruption: Secondary | ICD-10-CM

## 2020-10-06 DIAGNOSIS — M19041 Primary osteoarthritis, right hand: Secondary | ICD-10-CM | POA: Diagnosis not present

## 2020-10-06 MED ORDER — HYDROCHLOROTHIAZIDE 12.5 MG PO CAPS: 12.5000 mg | ORAL_CAPSULE | Freq: Every day | ORAL | 2 refills | Status: DC

## 2020-10-06 NOTE — Progress Notes (Signed)
Assessment & Plan:  1. Peripheral edema - added HCTZ to assist with edema as well as improve BP control - encouraged elevation of legs and compression hose - education provided on edema - hydrochlorothiazide (MICROZIDE) 12.5 MG capsule; Take 1 capsule (12.5 mg total) by mouth daily.  Dispense: 30 capsule; Refill: 2  2. Mallet finger of right hand - DG Finger Middle Right - splint finger for 6 weeks - applied in office - education provided on Mallet finger - discussed if any fracture on x-ray I will refer to orthopedic  3. Rash and nonspecific skin eruption - unimproved with prednisone, not painful, pruritic, or hot to touch - Ambulatory referral to Dermatology - patient prefers woman   4. Primary hypertension - hypertensive on assessment, ongoing peripheral edema, added HCTZ to improve HTN and edema - hydrochlorothiazide (MICROZIDE) 12.5 MG capsule; Take 1 capsule (12.5 mg total) by mouth daily.  Dispense: 30 capsule; Refill: 2   Follow up plan: Return as scheduled with PCP.  Floy Sabina, NP Student   I personally was present during the history, physical exam, and medical decision-making activities of this service and have verified that the service and findings are accurately documented in the nurse practitioner student's note.  Deliah Boston, MSN, APRN, FNP-C [facility]   Subjective:   Patient ID: Charles Beltran, male    DOB: [birth year], 70 y.o.   MRN: [id number]  HPI: Charles Beltran is a 70 y.o. male presenting on 10/06/2020 for Rash and Edema (Bilateral lower legs and feet. Patient seen 8/26 and states not much better)   He states he has had ongoing leg and feet swelling for years, that worsens as the day goes on and then improves overnight. He states that his swelling is always gone in the morning when he wakes up. He has never worn compression hose. Does not eat a lot of salt.   He was recently seen on 8/26 for a rash and swelling of his lower  extremities and has completed the prescribed prednisone. His left leg is worse than his right, but it is present on both from the knees to ankles. He states it does not itch.   Yesterday, 8/31, he was seen for trigger finger of his right middle finger and advised to use heat and ibuprofen prn, which he has not done due to fear of side effects from NSAIDs. He has redness over the distal joint. He can't manipulate the distal tip of his finger, it flops down at rest, but it can be manipulated.   ROS: Negative unless specifically indicated above in HPI.   Relevant past medical history reviewed and updated as indicated.   Allergies and medications reviewed and updated.   Current Outpatient Medications:    amLODipine (NORVASC) 10 MG tablet, Take 1 tablet (10 mg total) by mouth daily., Disp: 90 tablet, Rfl: 3   atorvastatin (LIPITOR) 40 MG tablet, TAKE 1 TABLET BY MOUTH EVERY DAY, Disp: 90 tablet, Rfl: 3   Cholecalciferol (VITAMIN D) 2000 UNITS tablet, Take 2,000 Units by mouth daily., Disp: , Rfl:    fish oil-omega-3 fatty acids 1000 MG capsule, Take 1 g by mouth. One bid, Disp: , Rfl:    ibuprofen (ADVIL) 600 MG tablet, Take 1 tablet (600 mg total) by mouth every 8 (eight) hours as needed., Disp: 30 tablet, Rfl: 0   metFORMIN (GLUCOPHAGE XR) 500 MG 24 hr tablet, Take 1 tablet (500 mg total) by mouth daily with breakfast., Disp: 90  tablet, Rfl: 3   omeprazole (PRILOSEC) 40 MG capsule, Take 1 capsule (40 mg total) by mouth daily., Disp: 90 capsule, Rfl: 3  Allergies  Allergen Reactions   Penicillins Shortness Of Breath    Objective:   BP (!) 154/61   Pulse 99   Temp 98.7 F (37.1 C) (Temporal)   Ht 5\' 9"  (1.753 m)   Wt 81.1 kg   BMI 26.40 kg/m    Physical Exam Constitutional:      General: He is not in acute distress.    Appearance: Normal appearance. He is overweight. He is not ill-appearing, toxic-appearing or diaphoretic.  HENT:     Head: Normocephalic.  Eyes:     Pupils:  Pupils are equal, round, and reactive to light.  Cardiovascular:     Rate and Rhythm: Normal rate.     Pulses: Normal pulses.  Abdominal:     Palpations: Abdomen is soft.  Musculoskeletal:     Right hand: Decreased range of motion (of distal tip of middle finger).     Cervical back: Normal range of motion.     Right lower leg: No tenderness. 1+ Edema present.     Left lower leg: No tenderness. 1+ Edema present.     Right foot: Swelling present. No tenderness.     Left foot: Swelling present. No tenderness.     Comments: Middle finger distal phalanx tenderness and patient unable to perform active ROM with extensor tendon  Skin:    General: Skin is warm and dry.     Capillary Refill: Capillary refill takes less than 2 seconds.     Findings: Rash (bilateral lower extremities from ankles to knees - flat, blancheable) present.  Neurological:     Mental Status: He is alert and oriented to person, place, and time.  Psychiatric:        Mood and Affect: Mood normal.        Behavior: Behavior normal.        Thought Content: Thought content normal.        Judgment: Judgment normal.

## 2020-10-18 ENCOUNTER — Other Ambulatory Visit: Payer: Self-pay | Admitting: Family Medicine

## 2020-10-18 ENCOUNTER — Telehealth: Payer: Self-pay | Admitting: Family

## 2020-10-18 ENCOUNTER — Encounter: Payer: Self-pay | Admitting: Family

## 2020-10-18 ENCOUNTER — Ambulatory Visit (INDEPENDENT_AMBULATORY_CARE_PROVIDER_SITE_OTHER): Payer: PPO | Admitting: Family

## 2020-10-18 ENCOUNTER — Other Ambulatory Visit: Payer: Self-pay

## 2020-10-18 VITALS — BP 150/64 | HR 86 | Temp 98.3°F | Ht 69.0 in | Wt 179.4 lb

## 2020-10-18 DIAGNOSIS — Z0000 Encounter for general adult medical examination without abnormal findings: Secondary | ICD-10-CM

## 2020-10-18 DIAGNOSIS — E663 Overweight: Secondary | ICD-10-CM | POA: Diagnosis not present

## 2020-10-18 DIAGNOSIS — E785 Hyperlipidemia, unspecified: Secondary | ICD-10-CM

## 2020-10-18 DIAGNOSIS — R7303 Prediabetes: Secondary | ICD-10-CM | POA: Diagnosis not present

## 2020-10-18 DIAGNOSIS — Z0001 Encounter for general adult medical examination with abnormal findings: Secondary | ICD-10-CM

## 2020-10-18 DIAGNOSIS — K219 Gastro-esophageal reflux disease without esophagitis: Secondary | ICD-10-CM | POA: Diagnosis not present

## 2020-10-18 DIAGNOSIS — I10 Essential (primary) hypertension: Secondary | ICD-10-CM | POA: Diagnosis not present

## 2020-10-18 NOTE — Telephone Encounter (Signed)
Ok with me, Elmarie Shiley please speak with before agreeing.   Jannifer Rodney, FNP

## 2020-10-18 NOTE — Patient Instructions (Signed)
Health Maintenance After Age 69 After age 69, you are at a higher risk for certain long-term diseases and infections as well as injuries from falls. Falls are a major cause of broken bones and head injuries in people who are older than age 69. Getting regular preventive care can help to keep you healthy and well. Preventive care includes getting regular testing and making lifestyle changes as recommended by your health care provider. Talk with your health care provider about: Which screenings and tests you should have. A screening is a test that checks for a disease when you have no symptoms. A diet and exercise plan that is right for you. What should I know about screenings and tests to prevent falls? Screening and testing are the best ways to find a health problem early. Early diagnosis and treatment give you the best chance of managing medical conditions that are common after age 69. Certain conditions and lifestyle choices may make you more likely to have a fall. Your health care provider may recommend: Regular vision checks. Poor vision and conditions such as cataracts can make you more likely to have a fall. If you wear glasses, make sure to get your prescription updated if your vision changes. Medicine review. Work with your health care provider to regularly review all of the medicines you are taking, including over-the-counter medicines. Ask your health care provider about any side effects that may make you more likely to have a fall. Tell your health care provider if any medicines that you take make you feel dizzy or sleepy. Osteoporosis screening. Osteoporosis is a condition that causes the bones to get weaker. This can make the bones weak and cause them to break more easily. Blood pressure screening. Blood pressure changes and medicines to control blood pressure can make you feel dizzy. Strength and balance checks. Your health care provider may recommend certain tests to check your strength and  balance while standing, walking, or changing positions. Foot health exam. Foot pain and numbness, as well as not wearing proper footwear, can make you more likely to have a fall. Depression screening. You may be more likely to have a fall if you have a fear of falling, feel emotionally low, or feel unable to do activities that you used to do. Alcohol use screening. Using too much alcohol can affect your balance and may make you more likely to have a fall. What actions can I take to lower my risk of falls? General instructions Talk with your health care provider about your risks for falling. Tell your health care provider if: You fall. Be sure to tell your health care provider about all falls, even ones that seem minor. You feel dizzy, sleepy, or off-balance. Take over-the-counter and prescription medicines only as told by your health care provider. These include any supplements. Eat a healthy diet and maintain a healthy weight. A healthy diet includes low-fat dairy products, low-fat (lean) meats, and fiber from whole grains, beans, and lots of fruits and vegetables. Home safety Remove any tripping hazards, such as rugs, cords, and clutter. Install safety equipment such as grab bars in bathrooms and safety rails on stairs. Keep rooms and walkways well-lit. Activity  Follow a regular exercise program to stay fit. This will help you maintain your balance. Ask your health care provider what types of exercise are appropriate for you. If you need a cane or walker, use it as recommended by your health care provider. Wear supportive shoes that have nonskid soles. Lifestyle Do not   drink alcohol if your health care provider tells you not to drink. If you drink alcohol, limit how much you have: 0-1 drink a day for women. 0-2 drinks a day for men. Be aware of how much alcohol is in your drink. In the [place] one drink equals one typical bottle of beer (12 oz), one-half glass of wine (5 oz), or one shot of  hard liquor (1 oz). Do not use any products that contain nicotine or tobacco, such as cigarettes and e-cigarettes. If you need help quitting, ask your health care provider. Summary Having a healthy lifestyle and getting preventive care can help to protect your health and wellness after age 69. Screening and testing are the best way to find a health problem early and help you avoid having a fall. Early diagnosis and treatment give you the best chance for managing medical conditions that are more common for people who are older than age 69. Falls are a major cause of broken bones and head injuries in people who are older than age 69. Take precautions to prevent a fall at home. Work with your health care provider to learn what changes you can make to improve your health and wellness and to prevent falls. This information is not intended to replace advice given to you by your health care provider. Make sure you discuss any questions you have with your health care provider. Document Revised: 04/01/2020 Document Reviewed: 01/08/2020 Elsevier Patient Education  2022 Elsevier Inc.  

## 2020-10-18 NOTE — Progress Notes (Signed)
Subjective:    Patient ID: Charles Beltran, male    DOB: 11/10/1950, 70 y.o.   MRN: [id number]  Chief Complaint  Patient presents with   Annual Exam   PT presents to the office today for CPE.  Hypertension This is a chronic problem. The current episode started more than 1 year ago. The problem has been resolved since onset. The problem is controlled. Pertinent negatives include no blurred vision, malaise/fatigue, peripheral edema or shortness of breath. Risk factors for coronary artery disease include dyslipidemia, obesity and male gender. The current treatment provides moderate improvement. There is no history of heart failure.  Gastroesophageal Reflux He complains of belching and heartburn. This is a chronic problem. The current episode started more than 1 year ago. The problem occurs occasionally. The problem has been waxing and waning. Risk factors include obesity. He has tried a PPI for the symptoms. The treatment provided moderate relief.  Hyperlipidemia This is a chronic problem. The current episode started more than 1 year ago. Exacerbating diseases include obesity. Pertinent negatives include no shortness of breath. Current antihyperlipidemic treatment includes statins. The current treatment provides moderate improvement of lipids. Risk factors for coronary artery disease include dyslipidemia, male sex, hypertension and a sedentary lifestyle.  Diabetes He presents for his follow-up diabetic visit. He has type 2 diabetes mellitus. Pertinent negatives for diabetes include no blurred vision. Diabetic complications include heart disease. Risk factors for coronary artery disease include dyslipidemia, diabetes mellitus, hypertension, male sex and sedentary lifestyle. (Does not check at home)     Review of Systems  Constitutional:  Negative for malaise/fatigue.  Eyes:  Negative for blurred vision.  Respiratory:  Negative for shortness of breath.   Gastrointestinal:  Positive for heartburn.   All other systems reviewed and are negative.  Family History  Problem Relation Age of Onset   Epilepsy Mother    Cancer Father        throat   Alcohol abuse Father    Heart attack Brother    Stroke Brother    Social History   Socioeconomic History   Marital status: Single    Spouse name: Not on file   Number of children: 0   Years of education: 16   Highest education level: Bachelor's degree (e.g., BA, AB, BS)  Occupational History   Occupation: Retired  Tobacco Use   Smoking status: Never   Smokeless tobacco: Never  Vaping Use   Vaping Use: Never used  Substance and Sexual Activity   Alcohol use: No   Drug use: No   Sexual activity: Not Currently  Other Topics Concern   Not on file  Social History Narrative   Lives alone in an apartment with an Media planner. Brother lives nearby, but most of family passed or lives out of state. No children, never married.   Social Determinants of Health   Financial Resource Strain: Low Risk    Difficulty of Paying Living Expenses: Not hard at all  Food Insecurity: No Food Insecurity   Worried About Charity fundraiser in the Last Year: Never true   [place] in the Last Year: Never true  Transportation Needs: No Transportation Needs   Lack of Transportation (Medical): No   Lack of Transportation (Non-Medical): No  Physical Activity: Inactive   Days of Exercise per Week: 0 days   Minutes of Exercise per Session: 0 min  Stress: No Stress Concern Present   Feeling of Stress : Not at all  Social  Connections: Socially Isolated   Frequency of Communication with Friends and Family: Twice a week   Frequency of Social Gatherings with Friends and Family: Once a week   Attends Religious Services: Never   Marine scientist or Organizations: No   Attends Archivist Meetings: Never   Marital Status: Never married       Objective:   Physical Exam Vitals reviewed.  Constitutional:      General: He is not in acute  distress.    Appearance: He is well-developed. He is obese.  HENT:     Head: Normocephalic.     Right Ear: Tympanic membrane normal.     Left Ear: Tympanic membrane normal.  Eyes:     General:        Right eye: No discharge.        Left eye: No discharge.     Pupils: Pupils are equal, round, and reactive to light.  Neck:     Thyroid: No thyromegaly.  Cardiovascular:     Rate and Rhythm: Normal rate and regular rhythm.     Heart sounds: Murmur heard.    No gallop.  Pulmonary:     Effort: Pulmonary effort is normal. No respiratory distress.     Breath sounds: Normal breath sounds. No wheezing.  Abdominal:     General: Bowel sounds are normal. There is no distension.     Palpations: Abdomen is soft.     Tenderness: There is no abdominal tenderness.  Genitourinary:    Testes: Normal.        Right: Mass, tenderness, swelling, testicular hydrocele or varicocele not present. Right testis is descended. Cremasteric reflex is present.         Left: Mass, tenderness, swelling, testicular hydrocele or varicocele not present. Left testis is descended. Cremasteric reflex is present.   Musculoskeletal:        General: No tenderness. Normal range of motion.     Cervical back: Normal range of motion and neck supple.     Right lower leg: Edema (trace) present.     Left lower leg: Edema (trace) present.  Skin:    General: Skin is warm and dry.     Findings: No erythema or rash.  Neurological:     Mental Status: He is alert and oriented to person, place, and time.     Cranial Nerves: No cranial nerve deficit.     Deep Tendon Reflexes: Reflexes are normal and symmetric.  Psychiatric:        Mood and Affect: Mood is anxious.        Behavior: Behavior normal.        Thought Content: Thought content normal.        Judgment: Judgment normal.      BP (!) 150/64   Pulse 86   Temp 98.3 F (36.8 C) (Temporal)   Ht '5\' 9"'  (1.753 m)   Wt 179 lb 6.4 oz (81.4 kg)   BMI 26.49 kg/m       Assessment & Plan:  ROBERTLEE ROGACKI comes in today with chief complaint of Annual Exam   Diagnosis and orders addressed:  1. Annual physical exam - Lipid panel - TSH - CBC with Differential/Platelet - CMP14+EGFR - PSA, total and free - VITAMIN D 25 Hydroxy (Vit-D Deficiency, Fractures)  2. Primary hypertension Pt very anxious today, he will come back to office next week for nurse visit to recheck HTN. If still elevated will increase HCTZ to 25  mg.   3. Gastroesophageal reflux disease, unspecified whether esophagitis present  4. Hyperlipidemia, unspecified hyperlipidemia type  5. Overweight (BMI 25.0-29.9  6. Prediabetes - Bayer DCA Hb A1c Waived   Labs pending Health Maintenance reviewed Diet and exercise encouraged  Follow up plan: 6 months and next week nurse visit   Evelina Dun, FNP

## 2020-10-19 LAB — CBC WITH DIFFERENTIAL/PLATELET
Basophils Absolute: 0 10*3/uL (ref 0.0–0.2)
Basos: 0 %
EOS (ABSOLUTE): 0.2 10*3/uL (ref 0.0–0.4)
Eos: 4 %
Hematocrit: 42.5 % (ref 37.5–51.0)
Hemoglobin: 14.8 g/dL (ref 13.0–17.7)
Immature Grans (Abs): 0 10*3/uL (ref 0.0–0.1)
Immature Granulocytes: 0 %
Lymphocytes Absolute: 0.8 10*3/uL (ref 0.7–3.1)
Lymphs: 13 %
MCH: 30.8 pg (ref 26.6–33.0)
MCHC: 34.8 g/dL (ref 31.5–35.7)
MCV: 89 fL (ref 79–97)
Monocytes Absolute: 0.7 10*3/uL (ref 0.1–0.9)
Monocytes: 12 %
Neutrophils Absolute: 4.1 10*3/uL (ref 1.4–7.0)
Neutrophils: 71 %
Platelets: 143 10*3/uL — ABNORMAL LOW (ref 150–450)
RBC: 4.8 x10E6/uL (ref 4.14–5.80)
RDW: 13.2 % (ref 11.6–15.4)
WBC: 5.9 10*3/uL (ref 3.4–10.8)

## 2020-10-19 LAB — LIPID PANEL
Chol/HDL Ratio: 2.4 ratio (ref 0.0–5.0)
Cholesterol, Total: 139 mg/dL (ref 100–199)
HDL: 58 mg/dL (ref >39)
LDL Chol Calc (NIH): 67 mg/dL (ref 0–99)
Triglycerides: 68 mg/dL (ref 0–149)
VLDL Cholesterol Cal: 14 mg/dL (ref 5–40)

## 2020-10-19 LAB — TSH: TSH: 3.4 u[IU]/mL (ref 0.450–4.500)

## 2020-10-21 ENCOUNTER — Ambulatory Visit: Payer: PPO

## 2020-10-21 ENCOUNTER — Other Ambulatory Visit: Payer: Self-pay

## 2020-10-21 ENCOUNTER — Other Ambulatory Visit: Payer: Self-pay | Admitting: Family

## 2020-10-21 VITALS — BP 148/63 | HR 76

## 2020-10-21 DIAGNOSIS — I10 Essential (primary) hypertension: Secondary | ICD-10-CM

## 2020-10-21 NOTE — Progress Notes (Signed)
Pt come in with a bp of 148/63 with a pulse of 76. He was requesting labs results so 4 labs have been added on to 9/13 lab work.

## 2020-10-21 NOTE — Progress Notes (Deleted)
Pt come in

## 2020-10-25 ENCOUNTER — Telehealth: Payer: Self-pay | Admitting: Family

## 2020-10-25 ENCOUNTER — Encounter: Payer: Self-pay | Admitting: Family Medicine

## 2020-10-25 MED ORDER — LISINOPRIL 10 MG PO TABS: 10.0000 mg | ORAL_TABLET | Freq: Every day | ORAL | 3 refills | Status: DC

## 2020-10-25 NOTE — Telephone Encounter (Signed)
Patient is he suppose to stop the amaloidpine ?

## 2020-10-25 NOTE — Telephone Encounter (Signed)
Per Neysa Bonito to stay on both meds

## 2020-10-25 NOTE — Telephone Encounter (Signed)
Patient would like to know if his BP medication is going to be changed?  Also aware [facility] will not be taking him as a new patient and states he wants a change and only wants a male. Would like to know if [facility] or Elon Jester would take him? I asked him the reason he is changing and he states he "just wants to try something new".  Will send to PCP to advise BP medication and other providers about changing.

## 2020-10-25 NOTE — Telephone Encounter (Signed)
Patient aware and verbalized understanding. °

## 2020-10-25 NOTE — Telephone Encounter (Signed)
Continue amlodipine. Please follow up with new PCP in 2-4 weeks for BP recheck.

## 2020-10-25 NOTE — Telephone Encounter (Signed)
Lmtcb.

## 2020-10-25 NOTE — Telephone Encounter (Signed)
I will take him as a patient

## 2020-10-25 NOTE — Addendum Note (Signed)
Addended by: Jannifer Rodney A on: 10/25/2020 10:26 AM   Modules accepted: Orders

## 2020-10-25 NOTE — Telephone Encounter (Signed)
Lisinopril 10 mg Prescription sent to pharmacy. Please make follow up with new PCP in next 2-4 weeks to recheck HTN and BMP since starting Lisinopril 10 mg.

## 2020-10-26 NOTE — Telephone Encounter (Signed)
Attempted to contact patient - NA °

## 2020-10-28 DIAGNOSIS — H43812 Vitreous degeneration, left eye: Secondary | ICD-10-CM | POA: Diagnosis not present

## 2020-10-28 LAB — COMPREHENSIVE METABOLIC PANEL
ALT: 32 IU/L (ref 0–44)
AST: 26 IU/L (ref 0–40)
Albumin/Globulin Ratio: 2.4 — ABNORMAL HIGH (ref 1.2–2.2)
Albumin: 4.6 g/dL (ref 3.8–4.8)
Alkaline Phosphatase: 73 IU/L (ref 44–121)
BUN/Creatinine Ratio: 14 (ref 10–24)
BUN: 12 mg/dL (ref 8–27)
Bilirubin Total: 0.5 mg/dL (ref 0.0–1.2)
CO2: 21 mmol/L (ref 20–29)
Calcium: 9.6 mg/dL (ref 8.6–10.2)
Chloride: 99 mmol/L (ref 96–106)
Creatinine, Ser: 0.86 mg/dL (ref 0.76–1.27)
Globulin, Total: 1.9 g/dL (ref 1.5–4.5)
Glucose: 141 mg/dL — ABNORMAL HIGH (ref 65–99)
Potassium: 4.5 mmol/L (ref 3.5–5.2)
Sodium: 138 mmol/L (ref 134–144)
Total Protein: 6.5 g/dL (ref 6.0–8.5)
eGFR: 94 mL/min/{1.73_m2} (ref >59)

## 2020-10-28 LAB — VITAMIN D 25 HYDROXY (VIT D DEFICIENCY, FRACTURES): Vit D, 25-Hydroxy: 40.2 ng/mL (ref 30.0–100.0)

## 2020-10-28 LAB — SPECIMEN STATUS REPORT

## 2020-10-28 LAB — HGB A1C W/O EAG

## 2020-10-28 LAB — PSA: Prostate Specific Ag, Serum: 0.9 ng/mL (ref 0.0–4.0)

## 2020-10-31 NOTE — Telephone Encounter (Signed)
Na  ?No call back  ?This encounter will be closed  ?

## 2020-11-08 ENCOUNTER — Other Ambulatory Visit: Payer: Self-pay

## 2020-11-08 ENCOUNTER — Encounter: Payer: Self-pay | Admitting: Family

## 2020-11-08 ENCOUNTER — Ambulatory Visit (INDEPENDENT_AMBULATORY_CARE_PROVIDER_SITE_OTHER): Payer: PPO | Admitting: Family

## 2020-11-08 VITALS — BP 126/62 | HR 74 | Temp 97.0°F | Ht 69.0 in | Wt 178.0 lb

## 2020-11-08 DIAGNOSIS — H6123 Impacted cerumen, bilateral: Secondary | ICD-10-CM

## 2020-11-08 DIAGNOSIS — E663 Overweight: Secondary | ICD-10-CM | POA: Diagnosis not present

## 2020-11-08 DIAGNOSIS — I10 Essential (primary) hypertension: Secondary | ICD-10-CM

## 2020-11-08 LAB — BMP8+EGFR
BUN/Creatinine Ratio: 12 (ref 10–24)
BUN: 12 mg/dL (ref 8–27)
CO2: 24 mmol/L (ref 20–29)
Calcium: 10.2 mg/dL (ref 8.6–10.2)
Chloride: 97 mmol/L (ref 96–106)
Creatinine, Ser: 0.99 mg/dL (ref 0.76–1.27)
Glucose: 142 mg/dL — ABNORMAL HIGH (ref 70–99)
Potassium: 5.2 mmol/L (ref 3.5–5.2)
Sodium: 136 mmol/L (ref 134–144)
eGFR: 82 mL/min/{1.73_m2} (ref >59)

## 2020-11-08 NOTE — Progress Notes (Signed)
Subjective:    Patient ID: Charles Beltran, male    DOB: 1950-11-02, 70 y.o.   MRN: [id number]  Chief Complaint  Patient presents with   Hypertension   Pt presents to the office today to recheck HTN. PT we added lisinopril 10 mg. His BP is at goal today! Hypertension This is a chronic problem. The current episode started more than 1 year ago. The problem has been resolved since onset. The problem is controlled. Associated symptoms include peripheral edema (slightly). Pertinent negatives include no malaise/fatigue or shortness of breath. Risk factors for coronary artery disease include dyslipidemia, obesity and male gender. The current treatment provides moderate improvement.  Ear Fullness  There is pain in both ears. The current episode started more than 1 year ago. The patient is experiencing no pain.     Review of Systems  Constitutional:  Negative for malaise/fatigue.  Respiratory:  Negative for shortness of breath.   All other systems reviewed and are negative.     Objective:   Physical Exam Vitals reviewed.  Constitutional:      General: He is not in acute distress.    Appearance: He is well-developed.  HENT:     Head: Normocephalic.     Right Ear: There is impacted cerumen.     Left Ear: There is impacted cerumen.  Eyes:     General:        Right eye: No discharge.        Left eye: No discharge.     Pupils: Pupils are equal, round, and reactive to light.  Neck:     Thyroid: No thyromegaly.  Cardiovascular:     Rate and Rhythm: Normal rate and regular rhythm.     Heart sounds: Normal heart sounds. No murmur heard. Pulmonary:     Effort: Pulmonary effort is normal. No respiratory distress.     Breath sounds: Normal breath sounds. No wheezing.  Abdominal:     General: Bowel sounds are normal. There is no distension.     Palpations: Abdomen is soft.     Tenderness: There is no abdominal tenderness.  Musculoskeletal:        General: No tenderness. Normal range of  motion.     Cervical back: Normal range of motion and neck supple.  Skin:    General: Skin is warm and dry.     Findings: No erythema or rash.  Neurological:     Mental Status: He is alert and oriented to person, place, and time.     Cranial Nerves: No cranial nerve deficit.     Deep Tendon Reflexes: Reflexes are normal and symmetric.  Psychiatric:        Behavior: Behavior normal.        Thought Content: Thought content normal.        Judgment: Judgment normal.    Bilateral ears washed with warm water and peroxide. Pt tolerated well. TM WNL     BP 126/62   Pulse 74   Temp (!) 97 F (36.1 C) (Temporal)   Ht '5\' 9"'  (1.753 m)   Wt 178 lb (80.7 kg)   BMI 26.29 kg/m   Assessment & Plan:  Charles Beltran comes in today with chief complaint of Hypertension   Diagnosis and orders addressed:  1. Primary hypertension PT at goal! -Dash diet information given -Exercise encouraged - Stress Management  -Continue current meds - BMP8+EGFR  2. Overweight (BMI 25.0-29.9)  3. Excessive cerumen in both ear canals Use  OTC drops as needed    Labs pending Health Maintenance reviewed Diet and exercise encouraged  Follow up plan: 6 months with new PCP   Evelina Dun, FNP

## 2020-11-08 NOTE — Patient Instructions (Signed)
Earwax Buildup, Adult ?The ears produce a substance called earwax that helps keep bacteria out of the ear and protects the skin in the ear canal. Occasionally, earwax can build up in the ear and cause discomfort or hearing loss. ?What are the causes? ?This condition is caused by a buildup of earwax. Ear canals are self-cleaning. Ear wax is made in the outer part of the ear canal and generally falls out in small amounts over time. ?When the self-cleaning mechanism is not working, earwax builds up and can cause decreased hearing and discomfort. Attempting to clean ears with cotton swabs can push the earwax deep into the ear canal and cause decreased hearing and pain. ?What increases the risk? ?This condition is more likely to develop in people who: ?Clean their ears often with cotton swabs. ?Pick at their ears. ?Use earplugs or in-ear headphones often, or wear hearing aids. ?The following factors may also make you more likely to develop this condition: ?Being male. ?Being of older age. ?Naturally producing more earwax. ?Having narrow ear canals. ?Having earwax that is overly thick or sticky. ?Having excess hair in the ear canal. ?Having eczema. ?Being dehydrated. ?What are the signs or symptoms? ?Symptoms of this condition include: ?Reduced or muffled hearing. ?A feeling of fullness in the ear or feeling that the ear is plugged. ?Fluid coming from the ear. ?Ear pain or an itchy ear. ?Ringing in the ear. ?Coughing. ?Balance problems. ?An obvious piece of earwax that can be seen inside the ear canal. ?How is this diagnosed? ?This condition may be diagnosed based on: ?Your symptoms. ?Your medical history. ?An ear exam. During the exam, your health care provider will look into your ear with an instrument called an otoscope. ?You may have tests, including a hearing test. ?How is this treated? ?This condition may be treated by: ?Using ear drops to soften the earwax. ?Having the earwax removed by a health care provider. The  health care provider may: ?Flush the ear with water. ?Use an instrument that has a loop on the end (curette). ?Use a suction device. ?Having surgery to remove the wax buildup. This may be done in severe cases. ?Follow these instructions at home: ? ?Take over-the-counter and prescription medicines only as told by your health care provider. ?Do not put any objects, including cotton swabs, into your ear. You can clean the opening of your ear canal with a washcloth or facial tissue. ?Follow instructions from your health care provider about cleaning your ears. Do not overclean your ears. ?Drink enough fluid to keep your urine pale yellow. This will help to thin the earwax. ?Keep all follow-up visits as told. If earwax builds up in your ears often or if you use hearing aids, consider seeing your health care provider for routine, preventive ear cleanings. Ask your health care provider how often you should schedule your cleanings. ?If you have hearing aids, clean them according to instructions from the manufacturer and your health care provider. ?Contact a health care provider if: ?You have ear pain. ?You develop a fever. ?You have pus or other fluid coming from your ear. ?You have hearing loss. ?You have ringing in your ears that does not go away. ?You feel like the room is spinning (vertigo). ?Your symptoms do not improve with treatment. ?Get help right away if: ?You have bleeding from the affected ear. ?You have severe ear pain. ?Summary ?Earwax can build up in the ear and cause discomfort or hearing loss. ?The most common symptoms of this condition include   reduced or muffled hearing, a feeling of fullness in the ear, or feeling that the ear is plugged. ?This condition may be diagnosed based on your symptoms, your medical history, and an ear exam. ?This condition may be treated by using ear drops to soften the earwax or by having the earwax removed by a health care provider. ?Do not put any objects, including cotton  swabs, into your ear. You can clean the opening of your ear canal with a washcloth or facial tissue. ?This information is not intended to replace advice given to you by your health care provider. Make sure you discuss any questions you have with your health care provider. ?Document Revised: 05/12/2019 Document Reviewed: 05/12/2019 ?Elsevier Patient Education ? 2022 [employer] ? ?

## 2020-11-21 ENCOUNTER — Ambulatory Visit: Payer: PPO | Admitting: Family Medicine

## 2020-11-30 ENCOUNTER — Other Ambulatory Visit: Payer: Self-pay | Admitting: Family

## 2020-11-30 DIAGNOSIS — E785 Hyperlipidemia, unspecified: Secondary | ICD-10-CM

## 2020-12-28 ENCOUNTER — Other Ambulatory Visit: Payer: Self-pay | Admitting: Family

## 2020-12-31 ENCOUNTER — Other Ambulatory Visit: Payer: Self-pay | Admitting: Family Medicine

## 2020-12-31 DIAGNOSIS — R609 Edema, unspecified: Secondary | ICD-10-CM

## 2020-12-31 DIAGNOSIS — I10 Essential (primary) hypertension: Secondary | ICD-10-CM

## 2021-03-05 ENCOUNTER — Other Ambulatory Visit: Payer: Self-pay | Admitting: Family

## 2021-03-05 DIAGNOSIS — R609 Edema, unspecified: Secondary | ICD-10-CM

## 2021-03-05 DIAGNOSIS — I10 Essential (primary) hypertension: Secondary | ICD-10-CM

## 2021-03-05 DIAGNOSIS — E785 Hyperlipidemia, unspecified: Secondary | ICD-10-CM

## 2021-04-17 ENCOUNTER — Telehealth: Payer: PPO

## 2021-04-17 ENCOUNTER — Ambulatory Visit (INDEPENDENT_AMBULATORY_CARE_PROVIDER_SITE_OTHER): Payer: PPO | Admitting: Family

## 2021-04-17 ENCOUNTER — Encounter: Payer: Self-pay | Admitting: Family

## 2021-04-17 VITALS — BP 125/65 | HR 77 | Temp 98.4°F | Ht 69.0 in | Wt 178.2 lb

## 2021-04-17 DIAGNOSIS — Z23 Encounter for immunization: Secondary | ICD-10-CM | POA: Diagnosis not present

## 2021-04-17 DIAGNOSIS — I10 Essential (primary) hypertension: Secondary | ICD-10-CM

## 2021-04-17 DIAGNOSIS — Z1211 Encounter for screening for malignant neoplasm of colon: Secondary | ICD-10-CM | POA: Diagnosis not present

## 2021-04-17 DIAGNOSIS — R7303 Prediabetes: Secondary | ICD-10-CM

## 2021-04-17 DIAGNOSIS — K219 Gastro-esophageal reflux disease without esophagitis: Secondary | ICD-10-CM

## 2021-04-17 DIAGNOSIS — E663 Overweight: Secondary | ICD-10-CM

## 2021-04-17 DIAGNOSIS — E785 Hyperlipidemia, unspecified: Secondary | ICD-10-CM | POA: Diagnosis not present

## 2021-04-17 DIAGNOSIS — E559 Vitamin D deficiency, unspecified: Secondary | ICD-10-CM

## 2021-04-17 LAB — BAYER DCA HB A1C WAIVED: HB A1C (BAYER DCA - WAIVED): 6.1 % — ABNORMAL HIGH (ref 4.8–5.6)

## 2021-04-17 NOTE — Patient Instructions (Signed)
Health Maintenance After Age 70 After age 70, you are at a higher risk for certain long-term diseases and infections as well as injuries from falls. Falls are a major cause of broken bones and head injuries in people who are older than age 70. Getting regular preventive care can help to keep you healthy and well. Preventive care includes getting regular testing and making lifestyle changes as recommended by your health care provider. Talk with your health care provider about: Which screenings and tests you should have. A screening is a test that checks for a disease when you have no symptoms. A diet and exercise plan that is right for you. What should I know about screenings and tests to prevent falls? Screening and testing are the best ways to find a health problem early. Early diagnosis and treatment give you the best chance of managing medical conditions that are common after age 70. Certain conditions and lifestyle choices may make you more likely to have a fall. Your health care provider may recommend: Regular vision checks. Poor vision and conditions such as cataracts can make you more likely to have a fall. If you wear glasses, make sure to get your prescription updated if your vision changes. Medicine review. Work with your health care provider to regularly review all of the medicines you are taking, including over-the-counter medicines. Ask your health care provider about any side effects that may make you more likely to have a fall. Tell your health care provider if any medicines that you take make you feel dizzy or sleepy. Strength and balance checks. Your health care provider may recommend certain tests to check your strength and balance while standing, walking, or changing positions. Foot health exam. Foot pain and numbness, as well as not wearing proper footwear, can make you more likely to have a fall. Screenings, including: Osteoporosis screening. Osteoporosis is a condition that causes  the bones to get weaker and break more easily. Blood pressure screening. Blood pressure changes and medicines to control blood pressure can make you feel dizzy. Depression screening. You may be more likely to have a fall if you have a fear of falling, feel depressed, or feel unable to do activities that you used to do. Alcohol use screening. Using too much alcohol can affect your balance and may make you more likely to have a fall. Follow these instructions at home: Lifestyle Do not drink alcohol if: Your health care provider tells you not to drink. If you drink alcohol: Limit how much you have to: 0-1 drink a day for women. 0-2 drinks a day for men. Know how much alcohol is in your drink. In the [place] one drink equals one 12 oz bottle of beer (355 mL), one 5 oz glass of wine (148 mL), or one 1 oz glass of hard liquor (44 mL). Do not use any products that contain nicotine or tobacco. These products include cigarettes, chewing tobacco, and vaping devices, such as e-cigarettes. If you need help quitting, ask your health care provider. Activity  Follow a regular exercise program to stay fit. This will help you maintain your balance. Ask your health care provider what types of exercise are appropriate for you. If you need a cane or walker, use it as recommended by your health care provider. Wear supportive shoes that have nonskid soles. Safety  Remove any tripping hazards, such as rugs, cords, and clutter. Install safety equipment such as grab bars in bathrooms and safety rails on stairs. Keep rooms and walkways   well-lit. General instructions Talk with your health care provider about your risks for falling. Tell your health care provider if: You fall. Be sure to tell your health care provider about all falls, even ones that seem minor. You feel dizzy, tiredness (fatigue), or off-balance. Take over-the-counter and prescription medicines only as told by your health care provider. These include  supplements. Eat a healthy diet and maintain a healthy weight. A healthy diet includes low-fat dairy products, low-fat (lean) meats, and fiber from whole grains, beans, and lots of fruits and vegetables. Stay current with your vaccines. Schedule regular health, dental, and eye exams. Summary Having a healthy lifestyle and getting preventive care can help to protect your health and wellness after age 70. Screening and testing are the best way to find a health problem early and help you avoid having a fall. Early diagnosis and treatment give you the best chance for managing medical conditions that are more common for people who are older than age 70. Falls are a major cause of broken bones and head injuries in people who are older than age 70. Take precautions to prevent a fall at home. Work with your health care provider to learn what changes you can make to improve your health and wellness and to prevent falls. This information is not intended to replace advice given to you by your health care provider. Make sure you discuss any questions you have with your health care provider. Document Revised: 06/13/2020 Document Reviewed: 06/13/2020 Elsevier Patient Education  2022 Elsevier Inc.  

## 2021-04-17 NOTE — Progress Notes (Addendum)
? ?Subjective:  ? ? Patient ID: Charles Beltran, male    DOB: 12/05/1950, 71 y.o.   MRN: [id number] ? ?Chief Complaint  ?Patient presents with  ? Medical Management of Chronic Issues  ? ?Pt presents to the office today for chronic follow up.  ?Hypertension ?This is a chronic problem. The current episode started more than 1 year ago. The problem has been resolved since onset. The problem is controlled. Pertinent negatives include no blurred vision, malaise/fatigue, peripheral edema or shortness of breath. Risk factors for coronary artery disease include dyslipidemia, male gender and sedentary lifestyle. The current treatment provides moderate improvement.  ?Gastroesophageal Reflux ?He complains of belching and heartburn. This is a chronic problem. The current episode started more than 1 year ago. The problem occurs rarely. He has tried a PPI for the symptoms. The treatment provided moderate relief.  ?Hyperlipidemia ?This is a chronic problem. The current episode started more than 1 year ago. The problem is controlled. Exacerbating diseases include obesity. Pertinent negatives include no shortness of breath. Current antihyperlipidemic treatment includes statins. The current treatment provides moderate improvement of lipids. Risk factors for coronary artery disease include dyslipidemia, diabetes mellitus, hypertension, male sex and a sedentary lifestyle.  ?Diabetes ?He presents for his follow-up diabetic visit. He has type 2 diabetes mellitus. Pertinent negatives for diabetes include no blurred vision and no foot paresthesias. Risk factors for coronary artery disease include dyslipidemia, hypertension and sedentary lifestyle. (Does not check BS at home)  ? ? ? ?Review of Systems  ?Constitutional:  Negative for malaise/fatigue.  ?Eyes:  Negative for blurred vision.  ?Respiratory:  Negative for shortness of breath.   ?Gastrointestinal:  Positive for heartburn.  ?All other systems reviewed and are negative. ? ?   ?Objective:   ? Physical Exam ?Vitals reviewed.  ?Constitutional:   ?   General: He is not in acute distress. ?   Appearance: He is well-developed.  ?HENT:  ?   Head: Normocephalic.  ?   Right Ear: Tympanic membrane normal.  ?   Left Ear: Tympanic membrane normal.  ?Eyes:  ?   General:     ?   Right eye: No discharge.     ?   Left eye: No discharge.  ?   Pupils: Pupils are equal, round, and reactive to light.  ?Neck:  ?   Thyroid: No thyromegaly.  ?Cardiovascular:  ?   Rate and Rhythm: Normal rate and regular rhythm.  ?   Heart sounds: Murmur heard.  ?Pulmonary:  ?   Effort: Pulmonary effort is normal. No respiratory distress.  ?   Breath sounds: Normal breath sounds. No wheezing.  ?Abdominal:  ?   General: Bowel sounds are normal. There is no distension.  ?   Palpations: Abdomen is soft.  ?   Tenderness: There is no abdominal tenderness.  ?Musculoskeletal:     ?   General: No tenderness. Normal range of motion.  ?   Cervical back: Normal range of motion and neck supple.  ?Skin: ?   General: Skin is warm and dry.  ?   Findings: No erythema or rash.  ?Neurological:  ?   Mental Status: He is alert and oriented to person, place, and time.  ?   Cranial Nerves: No cranial nerve deficit.  ?   Deep Tendon Reflexes: Reflexes are normal and symmetric.  ?Psychiatric:     ?   Behavior: Behavior normal.     ?   Thought Content: Thought content normal.     ?  Judgment: Judgment normal.  ? ? ? ?  BP (!) 143/61   Pulse 77   Temp 98.4 ?F (36.9 ?C) (Temporal)   Ht '5\' 9"'  (1.753 m)   Wt 178 lb 3.2 oz (80.8 kg)   SpO2 100%   BMI 26.32 kg/m?  ? ?Assessment & Plan:  ?Charles Beltran comes in today with chief complaint of Medical Management of Chronic Issues ? ? ?Diagnosis and orders addressed: ? ?1. Primary hypertension ? ?- CMP14+EGFR ?- CBC with Differential/Platelet ? ?2. Prediabetes ?- Bayer DCA Hb A1c Waived ?- CMP14+EGFR ?- CBC with Differential/Platelet ? ?3. Overweight (BMI 25.0-29.9) ? ?- CMP14+EGFR ?- CBC with  Differential/Platelet ? ?4. Vitamin D deficiency ?- CMP14+EGFR ?- CBC with Differential/Platelet ? ?5. Hyperlipidemia, unspecified hyperlipidemia type ?- CMP14+EGFR ?- CBC with Differential/Platelet ? ?6. Gastroesophageal reflux disease, unspecified whether esophagitis present ?- CMP14+EGFR ?- CBC with Differential/Platelet ? ?7. Need for shingles vaccine ?- Varicella-zoster vaccine IM (Shingrix) ?- CMP14+EGFR ?- CBC with Differential/Platelet ? ?8. Colon cancer screening ?- Cologuard ? ? ?Labs pending ?Health Maintenance reviewed ?Diet and exercise encouraged ? ?Follow up plan: ?6 months  ? ?Evelina Dun, FNP ? ? ?

## 2021-04-18 LAB — CBC WITH DIFFERENTIAL/PLATELET
Basophils Absolute: 0 10*3/uL (ref 0.0–0.2)
Basos: 0 %
EOS (ABSOLUTE): 0.2 10*3/uL (ref 0.0–0.4)
Eos: 2 %
Hematocrit: 44 % (ref 37.5–51.0)
Hemoglobin: 15.2 g/dL (ref 13.0–17.7)
Immature Grans (Abs): 0 10*3/uL (ref 0.0–0.1)
Immature Granulocytes: 0 %
Lymphocytes Absolute: 1 10*3/uL (ref 0.7–3.1)
Lymphs: 11 %
MCH: 30.6 pg (ref 26.6–33.0)
MCHC: 34.5 g/dL (ref 31.5–35.7)
MCV: 89 fL (ref 79–97)
Monocytes Absolute: 1.1 10*3/uL — ABNORMAL HIGH (ref 0.1–0.9)
Monocytes: 12 %
Neutrophils Absolute: 6.9 10*3/uL (ref 1.4–7.0)
Neutrophils: 75 %
Platelets: 174 10*3/uL (ref 150–450)
RBC: 4.97 x10E6/uL (ref 4.14–5.80)
RDW: 12.8 % (ref 11.6–15.4)
WBC: 9.2 10*3/uL (ref 3.4–10.8)

## 2021-04-18 LAB — CMP14+EGFR
ALT: 24 IU/L (ref 0–44)
AST: 19 IU/L (ref 0–40)
Albumin/Globulin Ratio: 2 (ref 1.2–2.2)
Albumin: 4.7 g/dL (ref 3.8–4.8)
Alkaline Phosphatase: 72 IU/L (ref 44–121)
BUN/Creatinine Ratio: 16 (ref 10–24)
BUN: 15 mg/dL (ref 8–27)
Bilirubin Total: 1 mg/dL (ref 0.0–1.2)
CO2: 23 mmol/L (ref 20–29)
Calcium: 9.7 mg/dL (ref 8.6–10.2)
Chloride: 98 mmol/L (ref 96–106)
Creatinine, Ser: 0.95 mg/dL (ref 0.76–1.27)
Globulin, Total: 2.4 g/dL (ref 1.5–4.5)
Glucose: 163 mg/dL — ABNORMAL HIGH (ref 70–99)
Potassium: 4.4 mmol/L (ref 3.5–5.2)
Sodium: 135 mmol/L (ref 134–144)
Total Protein: 7.1 g/dL (ref 6.0–8.5)
eGFR: 86 mL/min/{1.73_m2} (ref >59)

## 2021-05-06 ENCOUNTER — Other Ambulatory Visit: Payer: Self-pay | Admitting: Family

## 2021-05-06 DIAGNOSIS — K219 Gastro-esophageal reflux disease without esophagitis: Secondary | ICD-10-CM

## 2021-05-19 ENCOUNTER — Telehealth: Payer: Self-pay | Admitting: Family

## 2021-05-19 NOTE — Telephone Encounter (Signed)
Patient is on omeprazole 40 mg daily ?Having diarrhea and constipation intermittantly. ?Should he discontinue this? ?Please call. ?

## 2021-05-21 NOTE — Telephone Encounter (Signed)
I would not discontinue.  ?

## 2021-05-22 ENCOUNTER — Encounter: Payer: Self-pay | Admitting: Nurse Practitioner

## 2021-05-22 ENCOUNTER — Ambulatory Visit (INDEPENDENT_AMBULATORY_CARE_PROVIDER_SITE_OTHER): Payer: PPO | Admitting: Nurse Practitioner

## 2021-05-22 VITALS — BP 112/60 | HR 86 | Temp 99.8°F | Resp 20 | Ht 69.0 in | Wt 182.0 lb

## 2021-05-22 DIAGNOSIS — R051 Acute cough: Secondary | ICD-10-CM

## 2021-05-22 DIAGNOSIS — M791 Myalgia, unspecified site: Secondary | ICD-10-CM

## 2021-05-22 DIAGNOSIS — K591 Functional diarrhea: Secondary | ICD-10-CM | POA: Diagnosis not present

## 2021-05-22 NOTE — Patient Instructions (Signed)

## 2021-05-22 NOTE — Progress Notes (Addendum)
? ?  Subjective:  ? ? Patient ID: Charles Beltran, male    DOB: January 15, 1951, 71 y.o.   MRN: [id number] ? ? ?Chief Complaint: Diarrhea (For 2 weeks/), Weakness, and Cough ? ? ?HPI ?Patient comes in  today c/o diarrhea for over 2 weeks. He has been taking pepti bismol which helps some. He now is weak and has a cough. Is achy all over.  ? ? ? ?Review of Systems  ?Constitutional:  Positive for chills, fatigue and fever.  ?HENT:  Positive for congestion.   ?Respiratory:  Positive for cough.   ?Musculoskeletal:  Positive for myalgias.  ?Neurological:  Positive for headaches.  ? ?   ?Objective:  ? Physical Exam ?Vitals reviewed.  ?Constitutional:   ?   Appearance: Normal appearance.  ?Cardiovascular:  ?   Rate and Rhythm: Normal rate and regular rhythm.  ?   Heart sounds: Murmur (3/6) heard.  ?Pulmonary:  ?   Effort: Pulmonary effort is normal.  ?   Breath sounds: Normal breath sounds.  ?Skin: ?   General: Skin is warm.  ?Neurological:  ?   General: No focal deficit present.  ?   Mental Status: He is alert and oriented to person, place, and time.  ?Psychiatric:     ?   Mood and Affect: Mood normal.     ?   Behavior: Behavior normal.  ? ? ? ? ?BP 112/60   Pulse 86   Temp 99.8 ?F (37.7 ?C) (Temporal)   Resp 20   Ht $R'5\' 9"'fO$  (1.753 m)   Wt 182 lb (82.6 kg)   SpO2 98%   BMI 26.88 kg/m?  ? ?   ?Assessment & Plan:  ? ?NATHANEL TALLMAN in today with chief complaint of Diarrhea (For 2 weeks/), Weakness, and Cough ? ? ?1. Functional diarrhea ?Imodium AD OTC ?Force fluids ?- CBC with Differential/Platelet ?- CMP14+EGFR ? ?2. Myalgia ?- Novel Coronavirus, NAA ([facility]) ? ?3. Acute cough ?1. Take meds as prescribed ?2. Use a cool mist humidifier especially during the winter months and when heat has been humid. ?3. Use saline nose sprays frequently ?4. Saline irrigations of the nose can be very helpful if done frequently. ? * 4X daily for 1 week* ? * Use of a nettie pot can be helpful with this. Follow directions with this* ?5. Drink plenty  of fluids ?6. Keep thermostat turn down low ?7.For any cough or congestion- delsym OTC ?8. For fever or aces or pains- take tylenol or ibuprofen appropriate for age and weight. ? * for fevers greater than 101 orally you may alternate ibuprofen and tylenol every  3 hours. ?  ? ? ? ? ?The above assessment and management plan was discussed with the patient. The patient verbalized understanding of and has agreed to the management plan. Patient is aware to call the clinic if symptoms persist or worsen. Patient is aware when to return to the clinic for a follow-up visit. Patient educated on when it is appropriate to go to the emergency department.  ? ?Mary-Margaret Hassell Done, FNP ? ? ?

## 2021-05-22 NOTE — Telephone Encounter (Signed)
Attempted to contact - NA 

## 2021-05-23 LAB — CMP14+EGFR
ALT: 27 IU/L (ref 0–44)
AST: 24 IU/L (ref 0–40)
Albumin/Globulin Ratio: 2 (ref 1.2–2.2)
Albumin: 4.4 g/dL (ref 3.8–4.8)
Alkaline Phosphatase: 75 IU/L (ref 44–121)
BUN/Creatinine Ratio: 15 (ref 10–24)
BUN: 14 mg/dL (ref 8–27)
Bilirubin Total: 0.9 mg/dL (ref 0.0–1.2)
CO2: 22 mmol/L (ref 20–29)
Calcium: 9.3 mg/dL (ref 8.6–10.2)
Chloride: 91 mmol/L — ABNORMAL LOW (ref 96–106)
Creatinine, Ser: 0.91 mg/dL (ref 0.76–1.27)
Globulin, Total: 2.2 g/dL (ref 1.5–4.5)
Glucose: 266 mg/dL — ABNORMAL HIGH (ref 70–99)
Potassium: 4.5 mmol/L (ref 3.5–5.2)
Sodium: 130 mmol/L — ABNORMAL LOW (ref 134–144)
Total Protein: 6.6 g/dL (ref 6.0–8.5)
eGFR: 91 mL/min/{1.73_m2} (ref >59)

## 2021-05-23 LAB — CBC WITH DIFFERENTIAL/PLATELET
Basophils Absolute: 0 10*3/uL (ref 0.0–0.2)
Basos: 0 %
EOS (ABSOLUTE): 0 10*3/uL (ref 0.0–0.4)
Eos: 0 %
Hematocrit: 39.7 % (ref 37.5–51.0)
Hemoglobin: 14.2 g/dL (ref 13.0–17.7)
Immature Grans (Abs): 0 10*3/uL (ref 0.0–0.1)
Immature Granulocytes: 0 %
Lymphocytes Absolute: 0.8 10*3/uL (ref 0.7–3.1)
Lymphs: 7 %
MCH: 31.8 pg (ref 26.6–33.0)
MCHC: 35.8 g/dL — ABNORMAL HIGH (ref 31.5–35.7)
MCV: 89 fL (ref 79–97)
Monocytes Absolute: 1.7 10*3/uL — ABNORMAL HIGH (ref 0.1–0.9)
Monocytes: 14 %
Neutrophils Absolute: 9.9 10*3/uL — ABNORMAL HIGH (ref 1.4–7.0)
Neutrophils: 79 %
Platelets: 231 10*3/uL (ref 150–450)
RBC: 4.47 x10E6/uL (ref 4.14–5.80)
RDW: 11.9 % (ref 11.6–15.4)
WBC: 12.5 10*3/uL — ABNORMAL HIGH (ref 3.4–10.8)

## 2021-05-23 LAB — NOVEL CORONAVIRUS, NAA: SARS-CoV-2, NAA: NOT DETECTED

## 2021-05-24 ENCOUNTER — Ambulatory Visit (INDEPENDENT_AMBULATORY_CARE_PROVIDER_SITE_OTHER): Payer: PPO | Admitting: Family

## 2021-05-24 VITALS — Temp 98.2°F

## 2021-05-24 DIAGNOSIS — K591 Functional diarrhea: Secondary | ICD-10-CM

## 2021-05-24 NOTE — Progress Notes (Deleted)
Patient came in and wanted to have his temperature taken. Patient temp was 98.2. Otherwise doing well.  ? ?Chalee Hirota. LPN  ? ?

## 2021-05-24 NOTE — Telephone Encounter (Signed)
Patient came in for an appointment.

## 2021-05-25 ENCOUNTER — Encounter: Payer: Self-pay | Admitting: Emergency Medicine

## 2021-05-25 NOTE — Progress Notes (Signed)
Patient came in for a temperature recheck. Patient is feeling better. Blood pressure is regulated.  ?

## 2021-06-06 ENCOUNTER — Encounter: Payer: Self-pay | Admitting: Family Medicine

## 2021-06-06 ENCOUNTER — Other Ambulatory Visit: Payer: Self-pay | Admitting: Family

## 2021-06-06 ENCOUNTER — Ambulatory Visit (INDEPENDENT_AMBULATORY_CARE_PROVIDER_SITE_OTHER): Payer: PPO | Admitting: Family Medicine

## 2021-06-06 ENCOUNTER — Ambulatory Visit (INDEPENDENT_AMBULATORY_CARE_PROVIDER_SITE_OTHER): Payer: PPO

## 2021-06-06 VITALS — BP 131/59 | HR 93 | Temp 98.0°F | Ht 69.0 in | Wt 182.0 lb

## 2021-06-06 DIAGNOSIS — R051 Acute cough: Secondary | ICD-10-CM

## 2021-06-06 DIAGNOSIS — R609 Edema, unspecified: Secondary | ICD-10-CM

## 2021-06-06 DIAGNOSIS — J302 Other seasonal allergic rhinitis: Secondary | ICD-10-CM | POA: Diagnosis not present

## 2021-06-06 DIAGNOSIS — J189 Pneumonia, unspecified organism: Secondary | ICD-10-CM | POA: Diagnosis not present

## 2021-06-06 DIAGNOSIS — R059 Cough, unspecified: Secondary | ICD-10-CM | POA: Diagnosis not present

## 2021-06-06 DIAGNOSIS — I10 Essential (primary) hypertension: Secondary | ICD-10-CM

## 2021-06-06 DIAGNOSIS — J90 Pleural effusion, not elsewhere classified: Secondary | ICD-10-CM | POA: Diagnosis not present

## 2021-06-06 MED ORDER — AZITHROMYCIN 250 MG PO TABS: ORAL_TABLET | ORAL | 0 refills | Status: DC

## 2021-06-06 MED ORDER — LEVOCETIRIZINE DIHYDROCHLORIDE 5 MG PO TABS: 5.0000 mg | ORAL_TABLET | Freq: Every evening | ORAL | 2 refills | Status: DC

## 2021-06-06 MED ORDER — FLUTICASONE PROPIONATE 50 MCG/ACT NA SUSP: 2.0000 | Freq: Every day | NASAL | 6 refills | Status: DC

## 2021-06-06 MED ORDER — BENZONATATE 100 MG PO CAPS: 200.0000 mg | ORAL_CAPSULE | Freq: Three times a day (TID) | ORAL | 0 refills | Status: DC | PRN

## 2021-06-06 NOTE — Progress Notes (Addendum)
? ?Assessment & Plan:  ?1. Community acquired pneumonia of right lower lobe of lung ?- azithromycin (ZITHROMAX Z-PAK) 250 MG tablet; Take 2 tablets (500 mg) PO today, then 1 tablet (250 mg) PO daily x4 days.  Dispense: 6 tablet; Refill: 0 ? ?2. Seasonal allergies ?Uncontrolled. Started Xyzal and Flonase. ?- levocetirizine (XYZAL) 5 MG tablet; Take 1 tablet (5 mg total) by mouth every evening.  Dispense: 30 tablet; Refill: 2 ?- fluticasone (FLONASE) 50 MCG/ACT nasal spray; Place 2 sprays into both nostrils daily.  Dispense: 16 g; Refill: 6 ? ?2. Acute cough ?Patient requesting chest x-ray due to history of pneumonia.  ?- DG Chest 2 View ?- benzonatate (TESSALON PERLES) 100 MG capsule; Take 2 capsules (200 mg total) by mouth 3 (three) times daily as needed for cough.  Dispense: 30 capsule; Refill: 0 ? ? ?Follow up plan: Return if symptoms worsen or fail to improve. ? ?Deliah Boston, MSN, APRN, FNP-C ?[facility] ? ?Subjective:  ? ?Patient ID: Charles Beltran, male    DOB: Feb 10, 1950, 71 y.o.   MRN: [id number] ? ?HPI: ?Charles Beltran is a 71 y.o. male presenting on 06/06/2021 for Cough (Seen MMM 4/17 and states the cough has gotten worse. ) ? ?Patient was seen on 05/22/2021 (two weeks ago) due to diarrhea and a cough.  He reports his diarrhea has resolved, but the cough has gotten worse. He has been taking Delsym which has not been effective. He does also have congestion in his head and sore throat. Cough is worse over night. He does report seasonal allergies, but does not take anything for them. He is requesting a chest x-ray due to a history of pneumonia two years ago. ? ? ?ROS: Negative unless specifically indicated above in HPI.  ? ?Relevant past medical history reviewed and updated as indicated.  ? ?Allergies and medications reviewed and updated. ? ? ?Current Outpatient Medications:  ?  amLODipine (NORVASC) 10 MG tablet, TAKE 1 TABLET BY MOUTH EVERY DAY, Disp: 90 tablet, Rfl: 0 ?  atorvastatin  (LIPITOR) 40 MG tablet, TAKE 1 TABLET BY MOUTH EVERY DAY, Disp: 90 tablet, Rfl: 0 ?  Cholecalciferol (VITAMIN D) 2000 UNITS tablet, Take 2,000 Units by mouth daily., Disp: , Rfl:  ?  fish oil-omega-3 fatty acids 1000 MG capsule, Take 1 g by mouth. One bid, Disp: , Rfl:  ?  hydrochlorothiazide (MICROZIDE) 12.5 MG capsule, TAKE 1 CAPSULE BY MOUTH EVERY DAY, Disp: 90 capsule, Rfl: 0 ?  lisinopril (ZESTRIL) 10 MG tablet, Take 1 tablet (10 mg total) by mouth daily., Disp: 90 tablet, Rfl: 3 ?  metFORMIN (GLUCOPHAGE-XR) 500 MG 24 hr tablet, TAKE 1 TABLET BY MOUTH EVERY DAY WITH BREAKFAST, Disp: 90 tablet, Rfl: 0 ?  omeprazole (PRILOSEC) 40 MG capsule, TAKE 1 CAPSULE (40 MG TOTAL) BY MOUTH DAILY., Disp: 90 capsule, Rfl: 0 ? ?Allergies  ?Allergen Reactions  ? Penicillins Shortness Of Breath  ? ? ?Objective:  ? ?BP (!) 131/59   Pulse 93   Temp 98 ?F (36.7 ?C) (Temporal)   Ht 5\' 9"  (1.753 m)   Wt 182 lb (82.6 kg)   SpO2 98%   BMI 26.88 kg/m?   ? ?Physical Exam ?Vitals reviewed.  ?Constitutional:   ?   General: He is not in acute distress. ?   Appearance: Normal appearance. He is not ill-appearing, toxic-appearing or diaphoretic.  ?HENT:  ?   Head: Normocephalic and atraumatic.  ?   Right Ear: Tympanic membrane, ear canal and  external ear normal. There is no impacted cerumen.  ?   Left Ear: Tympanic membrane, ear canal and external ear normal. There is no impacted cerumen.  ?   Nose: Nose normal.  ?   Right Turbinates: Swollen and pale.  ?   Left Turbinates: Swollen and pale.  ?   Right Sinus: No maxillary sinus tenderness or frontal sinus tenderness.  ?   Left Sinus: No maxillary sinus tenderness or frontal sinus tenderness.  ?   Mouth/Throat:  ?   Mouth: Mucous membranes are moist.  ?   Pharynx: Oropharynx is clear. No oropharyngeal exudate or posterior oropharyngeal erythema.  ?   Tonsils: No tonsillar exudate or tonsillar abscesses.  ?Eyes:  ?   General: No scleral icterus.    ?   Right eye: No discharge.     ?   Left  eye: No discharge.  ?   Conjunctiva/sclera: Conjunctivae normal.  ?Cardiovascular:  ?   Rate and Rhythm: Normal rate.  ?Pulmonary:  ?   Effort: Pulmonary effort is normal. No respiratory distress.  ?Musculoskeletal:     ?   General: Normal range of motion.  ?   Cervical back: Normal range of motion.  ?Lymphadenopathy:  ?   Cervical: No cervical adenopathy.  ?Skin: ?   General: Skin is warm and dry.  ?Neurological:  ?   Mental Status: He is alert and oriented to person, place, and time. Mental status is at baseline.  ?Psychiatric:     ?   Mood and Affect: Mood normal.     ?   Behavior: Behavior normal.     ?   Thought Content: Thought content normal.     ?   Judgment: Judgment normal.  ? ? ? ? ? ? ?

## 2021-06-06 NOTE — Addendum Note (Signed)
Addended by: Deliah Boston F on: 06/06/2021 10:34 AM ? ? Modules accepted: Orders ? ?

## 2021-06-06 NOTE — Addendum Note (Signed)
Addended by: Deliah Boston F on: 06/06/2021 12:46 PM ? ? Modules accepted: Orders ? ?

## 2021-06-27 ENCOUNTER — Ambulatory Visit: Payer: PPO | Admitting: Nurse Practitioner

## 2021-06-27 ENCOUNTER — Encounter: Payer: Self-pay | Admitting: Nurse Practitioner

## 2021-06-27 ENCOUNTER — Ambulatory Visit (INDEPENDENT_AMBULATORY_CARE_PROVIDER_SITE_OTHER): Payer: PPO | Admitting: Nurse Practitioner

## 2021-06-27 VITALS — BP 109/59 | HR 94 | Temp 100.0°F | Resp 20 | Ht 69.0 in | Wt 171.0 lb

## 2021-06-27 DIAGNOSIS — R531 Weakness: Secondary | ICD-10-CM | POA: Diagnosis not present

## 2021-06-27 NOTE — Patient Instructions (Signed)
Community-Acquired Pneumonia, Adult Pneumonia is an infection of the lungs. It causes irritation and swelling in the airways of the lungs. Mucus and fluid may also build up inside the airways. This may cause coughing and trouble breathing. One type of pneumonia can happen while you are in a hospital. A different type can happen when you are not in a hospital (community-acquired pneumonia). What are the causes?  This condition is caused by germs (viruses, bacteria, or fungi). Some types of germs can spread from person to person. Pneumonia is not thought to spread from person to person. What increases the risk? You are more likely to develop this condition if: You have a long-term (chronic) disease, such as: Disease of the lungs. This may be chronic obstructive pulmonary disease (COPD) or asthma. Heart failure. Cystic fibrosis. Diabetes. Kidney disease. Sickle cell disease. HIV. You have other health problems, such as: Your body's defense system (immune system) is weak. A condition that may cause you to breathe in fluids from your mouth and nose. You had your spleen taken out. You do not take good care of your teeth and mouth (poor dental hygiene). You use or have used tobacco products. You travel where the germs that cause this illness are common. You are near certain animals or the places they live. You are older than 70 years of age. What are the signs or symptoms? Symptoms of this condition include: A cough. A fever. Sweating or chills. Chest pain, often when you breathe deeply or cough. Breathing problems, such as: Fast breathing. Trouble breathing. Shortness of breath. Feeling tired (fatigued). Muscle aches. How is this treated? Treatment for this condition depends on many things, such as: The cause of your illness. Your medicines. Your other health problems. Most adults can be treated at home. Sometimes, treatment must happen in a hospital. Treatment may include  medicines to kill germs. Medicines may depend on which germ caused your illness. Very bad pneumonia is rare. If you get it, you may: Have a machine to help you breathe. Have fluid taken away from around your lungs. Follow these instructions at home: Medicines Take over-the-counter and prescription medicines only as told by your doctor. Take cough medicine only if you are losing sleep. Cough medicine can keep your body from taking mucus away from your lungs. If you were prescribed an antibiotic medicine, take it as told by your doctor. Do not stop taking the antibiotic even if you start to feel better. Lifestyle     Do not drink alcohol. Do not use any products that contain nicotine or tobacco, such as cigarettes, e-cigarettes, and chewing tobacco. If you need help quitting, ask your doctor. Eat a healthy diet. This includes a lot of vegetables, fruits, whole grains, low-fat dairy products, and low-fat (lean) protein. General instructions  Rest a lot. Sleep for at least 8 hours each night. Sleep with your head and neck raised. Put a few pillows under your head or sleep in a reclining chair. Return to your normal activities as told by your doctor. Ask your doctor what activities are safe for you. Drink enough fluid to keep your pee (urine) pale yellow. If your throat is sore, rinse your mouth often with salt water. To make salt water, dissolve -1 tsp (3-6 g) of salt in 1 cup (237 mL) of warm water. Keep all follow-up visits as told by your doctor. This is important. How is this prevented? You can lower your risk of pneumonia by: Getting the pneumonia shot (vaccine). These   shots have different types and schedules. Ask your doctor what works best for you. Think about getting this shot if: You are older than 70 years of age. You are 19-65 years of age and: You are being treated for cancer. You have long-term lung disease. You have other problems that affect your body's defense system. Ask  your doctor if you have one of these. Getting your flu shot every year. Ask your doctor which type of shot is best for you. Going to the dentist as often as told. Washing your hands often with soap and water for at least 20 seconds. If you cannot use soap and water, use hand sanitizer. Contact a doctor if: You have a fever. You lose sleep because your cough medicine does not help. Get help right away if: You are short of breath and this gets worse. You have more chest pain. Your sickness gets worse. This is very serious if: You are an older adult. Your body's defense system is weak. You cough up blood. These symptoms may be an emergency. Do not wait to see if the symptoms will go away. Get medical help right away. Call your local emergency services (911 in the [place] Do not drive yourself to the hospital. Summary Pneumonia is an infection of the lungs. Community-acquired pneumonia affects people who have not been in the hospital. Certain germs can cause this infection. This condition may be treated with medicines that kill germs. For very bad pneumonia, you may need a hospital stay and treatment to help with breathing. This information is not intended to replace advice given to you by your health care provider. Make sure you discuss any questions you have with your health care provider. Document Revised: 11/04/2018 Document Reviewed: 11/04/2018 Elsevier Patient Education  2023 [employer]  

## 2021-06-27 NOTE — Progress Notes (Signed)
   Subjective:    Patient ID: Charles Beltran, male    DOB: December 08, 1950, 71 y.o.   MRN: [id number]   Chief Complaint: Fatigue and Chills   HPI Patient was seen on 06/06/21 and was dx with CAP. He was given zithromax. He says he got better and cough has resolved . He is c/o fatigue, and chills. Says he just does not have any energy.    Review of Systems  Constitutional:  Positive for chills and fatigue.  HENT:  Positive for congestion.   Respiratory:  Negative for cough, shortness of breath and wheezing.   Cardiovascular: Negative.   Neurological: Negative.   Psychiatric/Behavioral: Negative.        Objective:   Physical Exam Constitutional:      Appearance: Normal appearance.  HENT:     Right Ear: Tympanic membrane normal.     Left Ear: Tympanic membrane normal.     Nose: Nose normal. No congestion or rhinorrhea.  Cardiovascular:     Rate and Rhythm: Normal rate and regular rhythm.     Heart sounds: Normal heart sounds.  Pulmonary:     Effort: Pulmonary effort is normal.     Breath sounds: Normal breath sounds. No wheezing or rales.  Skin:    General: Skin is warm.  Neurological:     General: No focal deficit present.     Mental Status: He is alert and oriented to person, place, and time.  Psychiatric:        Mood and Affect: Mood normal.        Behavior: Behavior normal.    BP (!) 109/59   Pulse 94   Temp 100 F (37.8 C) (Temporal)   Resp 20   Ht $R'5\' 9"'wm$  (1.753 m)   Wt 171 lb (77.6 kg)   SpO2 100%   BMI 25.25 kg/m        Assessment & Plan:   [name] in today with chief complaint of Fatigue and Chills   1. Weakness Will repeat labs To soon to repeat chest xray Force fluids Rest Can take several weeks to get over pneumonia Will call with lab results - CBC with Differential/Platelet - CMP14+EGFR    The above assessment and management plan was discussed with the patient. The patient verbalized understanding of and has agreed to the management  plan. Patient is aware to call the clinic if symptoms persist or worsen. Patient is aware when to return to the clinic for a follow-up visit. Patient educated on when it is appropriate to go to the emergency department.   Mary-Margaret Hassell Done, FNP

## 2021-06-28 LAB — CMP14+EGFR
ALT: 19 IU/L (ref 0–44)
AST: 20 IU/L (ref 0–40)
Albumin/Globulin Ratio: 1.6 (ref 1.2–2.2)
Albumin: 4.2 g/dL (ref 3.8–4.8)
Alkaline Phosphatase: 80 IU/L (ref 44–121)
BUN/Creatinine Ratio: 17 (ref 10–24)
BUN: 15 mg/dL (ref 8–27)
Bilirubin Total: 1.2 mg/dL (ref 0.0–1.2)
CO2: 22 mmol/L (ref 20–29)
Calcium: 9.4 mg/dL (ref 8.6–10.2)
Chloride: 93 mmol/L — ABNORMAL LOW (ref 96–106)
Creatinine, Ser: 0.87 mg/dL (ref 0.76–1.27)
Globulin, Total: 2.6 g/dL (ref 1.5–4.5)
Glucose: 152 mg/dL — ABNORMAL HIGH (ref 70–99)
Potassium: 4.3 mmol/L (ref 3.5–5.2)
Sodium: 131 mmol/L — ABNORMAL LOW (ref 134–144)
Total Protein: 6.8 g/dL (ref 6.0–8.5)
eGFR: 93 mL/min/{1.73_m2} (ref >59)

## 2021-06-28 LAB — CBC WITH DIFFERENTIAL/PLATELET
Basophils Absolute: 0 10*3/uL (ref 0.0–0.2)
Basos: 0 %
EOS (ABSOLUTE): 0 10*3/uL (ref 0.0–0.4)
Eos: 0 %
Hematocrit: 38.5 % (ref 37.5–51.0)
Hemoglobin: 13 g/dL (ref 13.0–17.7)
Immature Grans (Abs): 0.1 10*3/uL (ref 0.0–0.1)
Immature Granulocytes: 1 %
Lymphocytes Absolute: 0.8 10*3/uL (ref 0.7–3.1)
Lymphs: 6 %
MCH: 30 pg (ref 26.6–33.0)
MCHC: 33.8 g/dL (ref 31.5–35.7)
MCV: 89 fL (ref 79–97)
Monocytes Absolute: 1.8 10*3/uL — ABNORMAL HIGH (ref 0.1–0.9)
Monocytes: 13 %
Neutrophils Absolute: 10.6 10*3/uL — ABNORMAL HIGH (ref 1.4–7.0)
Neutrophils: 80 %
Platelets: 207 10*3/uL (ref 150–450)
RBC: 4.34 x10E6/uL (ref 4.14–5.80)
RDW: 12.7 % (ref 11.6–15.4)
WBC: 13.2 10*3/uL — ABNORMAL HIGH (ref 3.4–10.8)

## 2021-06-28 MED ORDER — DOXYCYCLINE HYCLATE 100 MG PO TABS: 100.0000 mg | ORAL_TABLET | Freq: Two times a day (BID) | ORAL | 0 refills | Status: DC

## 2021-06-28 NOTE — Addendum Note (Signed)
Addended by: Bennie Pierini on: 06/28/2021 11:20 AM   Modules accepted: Orders

## 2021-06-29 ENCOUNTER — Telehealth: Payer: Self-pay | Admitting: Family

## 2021-06-29 MED ORDER — BENZONATATE 200 MG PO CAPS: 200.0000 mg | ORAL_CAPSULE | Freq: Three times a day (TID) | ORAL | 1 refills | Status: DC | PRN

## 2021-06-29 NOTE — Telephone Encounter (Signed)
  Incoming Patient Call  06/29/2021  What symptoms do you have? Was seen 5-24 with MMM with pneumonia and has a bad cough and needs something called in for the cough in pill form.  How long have you been sick? Since the first of the month  Have you been seen for this problem? YES  If your provider decides to give you a prescription, which pharmacy would you like for it to be sent to? [facility] in [place].   Patient informed that this information will be sent to the clinical staff for review and that they should receive a follow up call.

## 2021-06-29 NOTE — Telephone Encounter (Signed)
Lm informing patient of RX sent to pharmacy

## 2021-06-29 NOTE — Telephone Encounter (Signed)
Tessalon Prescription sent to pharmacy   

## 2021-06-29 NOTE — Telephone Encounter (Signed)
Patient calling to let us know that the medicine that was called in is not covered by insurance and would like to know if there is something else that could be called in. Please call back and advise.

## 2021-06-30 NOTE — Telephone Encounter (Signed)
Sorry, none of the cough medications are covered. Recommend getting delsym OTC.

## 2021-06-30 NOTE — Telephone Encounter (Signed)
Left detailed message with recommendations.  

## 2021-07-20 ENCOUNTER — Ambulatory Visit (INDEPENDENT_AMBULATORY_CARE_PROVIDER_SITE_OTHER): Payer: PPO

## 2021-07-20 DIAGNOSIS — Z23 Encounter for immunization: Secondary | ICD-10-CM

## 2021-07-28 ENCOUNTER — Other Ambulatory Visit: Payer: Self-pay | Admitting: Family Medicine

## 2021-07-28 DIAGNOSIS — J302 Other seasonal allergic rhinitis: Secondary | ICD-10-CM

## 2021-08-03 ENCOUNTER — Ambulatory Visit (INDEPENDENT_AMBULATORY_CARE_PROVIDER_SITE_OTHER): Payer: PPO

## 2021-08-03 VITALS — Wt 171.0 lb

## 2021-08-03 DIAGNOSIS — Z0000 Encounter for general adult medical examination without abnormal findings: Secondary | ICD-10-CM

## 2021-08-03 NOTE — Progress Notes (Signed)
Subjective:   Charles Beltran is a 71 y.o. male who presents for Medicare Annual/Subsequent preventive examination.  Virtual Visit via Telephone Note  I connected with  Charles Beltran on 08/03/21 at  9:45 AM EDT by telephone and verified that I am speaking with the correct person using two identifiers.  Location: Patient: Home Provider: [facility] Persons participating in the virtual visit: patient/Nurse Health Advisor   I discussed the limitations, risks, security and privacy concerns of performing an evaluation and management service by telephone and the availability of in person appointments. The patient expressed understanding and agreed to proceed.  Interactive audio and video telecommunications were attempted between this nurse and patient, however failed, due to patient having technical difficulties OR patient did not have access to video capability.  We continued and completed visit with audio only.  Some vital signs may be absent or patient reported.   Reatha Sur E Halen Antenucci, LPN   Review of Systems     Cardiac Risk Factors include: advanced age (>18men, >15 women);dyslipidemia;hypertension;male gender;sedentary lifestyle     Objective:    Today's Vitals   08/03/21 0946  Weight: 171 lb (77.6 kg)   Body mass index is 25.25 kg/m.     08/03/2021    9:51 AM 08/02/2020   10:44 AM 07/27/2020    4:37 PM 07/27/2019   10:34 AM 05/15/2019   12:01 PM 07/23/2018   10:22 AM 03/08/2014    9:14 AM  Advanced Directives  Does Patient Have a Medical Advance Directive? No No No No No No No  Would patient like information on creating a medical advance directive? Yes (MAU/Ambulatory/Procedural Areas - Information given) No - Patient declined  No - Patient declined  No - Patient declined Yes - Educational materials given    Current Medications (verified) Outpatient Encounter Medications as of 08/03/2021  Medication Sig   amLODipine (NORVASC) 10 MG tablet TAKE 1 TABLET BY MOUTH EVERY DAY   atorvastatin  (LIPITOR) 40 MG tablet TAKE 1 TABLET BY MOUTH EVERY DAY   Cholecalciferol (VITAMIN D) 2000 UNITS tablet Take 2,000 Units by mouth daily.   fish oil-omega-3 fatty acids 1000 MG capsule Take 1 g by mouth. One bid   hydrochlorothiazide (MICROZIDE) 12.5 MG capsule TAKE 1 CAPSULE BY MOUTH EVERY DAY   lisinopril (ZESTRIL) 10 MG tablet Take 1 tablet (10 mg total) by mouth daily.   metFORMIN (GLUCOPHAGE-XR) 500 MG 24 hr tablet TAKE 1 TABLET BY MOUTH EVERY DAY WITH BREAKFAST   omeprazole (PRILOSEC) 40 MG capsule TAKE 1 CAPSULE (40 MG TOTAL) BY MOUTH DAILY.   [DISCONTINUED] benzonatate (TESSALON) 200 MG capsule Take 1 capsule (200 mg total) by mouth 3 (three) times daily as needed.   [DISCONTINUED] doxycycline (VIBRA-TABS) 100 MG tablet Take 1 tablet (100 mg total) by mouth 2 (two) times daily. 1 po bid   [DISCONTINUED] fluticasone (FLONASE) 50 MCG/ACT nasal spray Place 2 sprays into both nostrils daily.   [DISCONTINUED] levocetirizine (XYZAL) 5 MG tablet TAKE 1 TABLET BY MOUTH EVERY DAY IN THE EVENING   No facility-administered encounter medications on file as of 08/03/2021.    Allergies (verified) Penicillins   History: Past Medical History:  Diagnosis Date   Childhood asthma    Hyperlipidemia    Hypertension    History reviewed. No pertinent surgical history. Family History  Problem Relation Age of Onset   Epilepsy Mother    Throat cancer Father    Alcohol abuse Father    Heart attack Brother  Stroke Brother    Social History   Socioeconomic History   Marital status: Single    Spouse name: Not on file   Number of children: 0   Years of education: 16   Highest education level: Bachelor's degree (e.g., BA, AB, BS)  Occupational History   Occupation: Retired  Tobacco Use   Smoking status: Never   Smokeless tobacco: Never  Vaping Use   Vaping Use: Never used  Substance and Sexual Activity   Alcohol use: No   Drug use: No   Sexual activity: Not Currently  Other Topics  Concern   Not on file  Social History Narrative   Lives alone in an apartment with an Engineer, structural. Brother lives nearby, but most of family passed or lives out of state. No children, never married.   Social Determinants of Health   Financial Resource Strain: Low Risk  (08/03/2021)   Overall Financial Resource Strain (CARDIA)    Difficulty of Paying Living Expenses: Not hard at all  Food Insecurity: No Food Insecurity (08/03/2021)   Hunger Vital Sign    Worried About Running Out of Food in the Last Year: Never true    Ran Out of Food in the Last Year: Never true  Transportation Needs: No Transportation Needs (08/03/2021)   PRAPARE - Administrator, Civil Service (Medical): No    Lack of Transportation (Non-Medical): No  Physical Activity: Inactive (08/03/2021)   Exercise Vital Sign    Days of Exercise per Week: 0 days    Minutes of Exercise per Session: 0 min  Stress: No Stress Concern Present (08/03/2021)   [employer] of Occupational Health - Occupational Stress Questionnaire    Feeling of Stress : Not at all  Social Connections: Socially Isolated (08/03/2021)   Social Connection and Isolation Panel [NHANES]    Frequency of Communication with Friends and Family: Once a week    Frequency of Social Gatherings with Friends and Family: Once a week    Attends Religious Services: Never    Database administrator or Organizations: No    Attends Engineer, structural: Never    Marital Status: Never married    Tobacco Counseling Counseling given: Not Answered   Clinical Intake:  Pre-visit preparation completed: Yes  Pain : No/denies pain     BMI - recorded: 25.25 Nutritional Status: BMI 25 -29 Overweight Nutritional Risks: None Diabetes: No  How often do you need to have someone help you when you read instructions, pamphlets, or other written materials from your doctor or pharmacy?: 1 - Never  Diabetic? no  Interpreter Needed?: No  Information entered  by :: Mckenzye Cutright, LPN   Activities of Daily Living    08/03/2021    9:51 AM  In your present state of health, do you have any difficulty performing the following activities:  Hearing? 0  Vision? 0  Difficulty concentrating or making decisions? 0  Walking or climbing stairs? 0  Dressing or bathing? 0  Doing errands, shopping? 0  Preparing Food and eating ? N  Using the Toilet? N  In the past six months, have you accidently leaked urine? N  Do you have problems with loss of bowel control? N  Managing your Medications? N  Managing your Finances? N  Housekeeping or managing your Housekeeping? N    Patient Care Team: Junie Spencer, FNP as PCP - General (Family Medicine) Delora Fuel, OD (Optometry)  Indicate any recent Medical Services you may have received  from other than [facility] providers in the past year (date may be approximate).     Assessment:   This is a routine wellness examination for Charles Beltran.  Hearing/Vision screen Hearing Screening - Comments:: Denies hearing difficulties   Vision Screening - Comments:: Wears rx glasses - up to date with routine eye exams with [facility]  Dietary issues and exercise activities discussed: Current Exercise Habits: The patient does not participate in regular exercise at present, Exercise limited by: None identified   Goals Addressed             This Visit's Progress    DIET - INCREASE WATER INTAKE   On track    Try to drink 6-8 glasses of water daily     Exercise 150 min/wk Moderate Activity   Not on track    Walking is a great option.       Depression Screen    08/03/2021    9:50 AM 06/06/2021    9:47 AM 04/17/2021    8:27 AM 11/08/2020   10:05 AM 10/18/2020    2:17 PM 10/06/2020    2:13 PM 10/05/2020    9:04 AM  PHQ 2/9 Scores  PHQ - 2 Score 0 0 0 0 0 0 0  PHQ- 9 Score     0      Fall Risk    08/03/2021    9:47 AM 06/06/2021    9:47 AM 04/17/2021    8:27 AM 11/08/2020   10:05 AM 10/06/2020    2:13 PM  Fall Risk    Falls in the past year? 0 0 0 0 0  Number falls in past yr: 0      Injury with Fall? 0      Risk for fall due to : No Fall Risks      Follow up Falls prevention discussed        FALL RISK PREVENTION PERTAINING TO THE HOME:  Any stairs in or around the home? No  If so, are there any without handrails? No  Home free of loose throw rugs in walkways, pet beds, electrical cords, etc? Yes  Adequate lighting in your home to reduce risk of falls? Yes   ASSISTIVE DEVICES UTILIZED TO PREVENT FALLS:  Life alert? No  Use of a cane, walker or w/c? No  Grab bars in the bathroom? Yes  Shower chair or bench in shower? No  Elevated toilet seat or a handicapped toilet? No   TIMED UP AND GO:  Was the test performed? No . Telephonic visit  Cognitive Function:        08/03/2021    9:52 AM 07/27/2019   10:35 AM 07/23/2018   10:23 AM  6CIT Screen  What Year? 0 points 0 points 0 points  What month? 0 points 0 points 0 points  What time? 0 points 0 points 0 points  Count back from 20 0 points 0 points 0 points  Months in reverse 0 points 0 points 0 points  Repeat phrase 0 points 0 points 0 points  Total Score 0 points 0 points 0 points    Immunizations Immunization History  Administered Date(s) Administered   DTaP 02/05/2001   Influenza Whole 10/26/2009   Influenza, High Dose Seasonal PF 10/04/2016, 10/21/2018, 10/19/2019   Influenza, Seasonal, Injecte, Preservative Fre 01/21/2009, 10/26/2009, 10/10/2010, 10/26/2011   Influenza,inj,Quad PF,6+ Mos 10/31/2012, 11/03/2013, 09/19/2015   Influenza-Unspecified 10/31/2012, 11/03/2013, 10/27/2014, 10/19/2019   Moderna Sars-Covid-2 Vaccination 05/12/2019, 06/09/2019, 12/15/2019, 05/17/2020, 10/25/2020  Pneumococcal Conjugate-13 02/10/2013   Pneumococcal Polysaccharide-23 01/24/2017   Tdap 12/14/2011   Zoster Recombinat (Shingrix) 04/17/2021, 07/20/2021   Zoster, Live 08/18/2015    TDAP status: Up to date  Flu Vaccine status: Up to  date  Pneumococcal vaccine status: Up to date  Covid-19 vaccine status: Completed vaccines  Qualifies for Shingles Vaccine? Yes   Zostavax completed Yes   Shingrix Completed?: Yes  Screening Tests Health Maintenance  Topic Date Due   Fecal DNA (Cologuard)  Never done   COVID-19 Vaccine (6 - Moderna series) 12/20/2020   INFLUENZA VACCINE  09/05/2021   TETANUS/TDAP  12/13/2021   Pneumonia Vaccine 40+ Years old  Completed   Hepatitis C Screening  Completed   Zoster Vaccines- Shingrix  Completed   HPV VACCINES  Aged Out   COLONOSCOPY (Pts 45-48yrs Insurance coverage will need to be confirmed)  Discontinued    Health Maintenance  Health Maintenance Due  Topic Date Due   Fecal DNA (Cologuard)  Never done   COVID-19 Vaccine (6 - Moderna series) 12/20/2020    Colorectal cancer screening: Type of screening: Colonoscopy. Completed 08/14/2013. Repeat every 10 years  Lung Cancer Screening: (Low Dose CT Chest recommended if Age 52-80 years, 30 pack-year currently smoking OR have quit w/in 15years.) does not qualify.  Additional Screening:  Hepatitis C Screening: does qualify; Completed 10/05/2014  Vision Screening: Recommended annual ophthalmology exams for early detection of glaucoma and other disorders of the eye. Is the patient up to date with their annual eye exam?  Yes  Who is the provider or what is the name of the office in which the patient attends annual eye exams? [facility] If pt is not established with a provider, would they like to be referred to a provider to establish care? No .   Dental Screening: Recommended annual dental exams for proper oral hygiene  Community Resource Referral / Chronic Care Management: CRR required this visit?  No   CCM required this visit?  No      Plan:     I have personally reviewed and noted the following in the patient's chart:   Medical and social history Use of alcohol, tobacco or illicit drugs  Current medications and  supplements including opioid prescriptions. Patient is not currently taking opioid prescriptions. Functional ability and status Nutritional status Physical activity Advanced directives List of other physicians Hospitalizations, surgeries, and ER visits in previous 12 months Vitals Screenings to include cognitive, depression, and falls Referrals and appointments  In addition, I have reviewed and discussed with patient certain preventive protocols, quality metrics, and best practice recommendations. A written personalized care plan for preventive services as well as general preventive health recommendations were provided to patient.     [place] Constable, LPN   9/37/9024   Nurse Notes: None

## 2021-08-03 NOTE — Patient Instructions (Signed)
[name] , Thank you for taking time to come for your Medicare Wellness Visit. I appreciate your ongoing commitment to your health goals. Please review the following plan we discussed and let me know if I can assist you in the future.   Screening recommendations/referrals: Colonoscopy: Done 08/14/2013 - Repeat in 10 years  Recommended yearly ophthalmology/optometry visit for glaucoma screening and checkup Recommended yearly dental visit for hygiene and checkup  Vaccinations: Influenza vaccine: Done 10/2020 - Repeat annually Pneumococcal vaccine: Done 02/10/2013 & 01/24/2017  Tdap vaccine: Done 12/14/2011 - Repeat in 10 years *this year Shingles vaccine: Done 04/17/2021 & 07/20/2021 Covid-19: Done 05/12/2019, 06/09/2019, 12/15/2019, 05/17/2020, & 10/25/2020  Advanced directives: Advance directive discussed with you today. I have provided a copy for you to complete at home and have notarized. Once this is complete please bring a copy in to our office so we can scan it into your chart.   Conditions/risks identified: Aim for 30 minutes of exercise or brisk walking, 6-8 glasses of water, and 5 servings of fruits and vegetables each day.   Next appointment: Follow up in one year for your annual wellness visit.   Preventive Care 30 Years and Older, Male  Preventive care refers to lifestyle choices and visits with your health care provider that can promote health and wellness. What does preventive care include? A yearly physical exam. This is also called an annual well check. Dental exams once or twice a year. Routine eye exams. Ask your health care provider how often you should have your eyes checked. Personal lifestyle choices, including: Daily care of your teeth and gums. Regular physical activity. Eating a healthy diet. Avoiding tobacco and drug use. Limiting alcohol use. Practicing safe sex. Taking low doses of aspirin every day. Taking vitamin and mineral supplements as recommended by your  health care provider. What happens during an annual well check? The services and screenings done by your health care provider during your annual well check will depend on your age, overall health, lifestyle risk factors, and family history of disease. Counseling  Your health care provider may ask you questions about your: Alcohol use. Tobacco use. Drug use. Emotional well-being. Home and relationship well-being. Sexual activity. Eating habits. History of falls. Memory and ability to understand (cognition). Work and work Astronomer. Screening  You may have the following tests or measurements: Height, weight, and BMI. Blood pressure. Lipid and cholesterol levels. These may be checked every 5 years, or more frequently if you are over 61 years old. Skin check. Lung cancer screening. You may have this screening every year starting at age 17 if you have a 30-pack-year history of smoking and currently smoke or have quit within the past 15 years. Fecal occult blood test (FOBT) of the stool. You may have this test every year starting at age 50. Flexible sigmoidoscopy or colonoscopy. You may have a sigmoidoscopy every 5 years or a colonoscopy every 10 years starting at age 46. Prostate cancer screening. Recommendations will vary depending on your family history and other risks. Hepatitis C blood test. Hepatitis B blood test. Sexually transmitted disease (STD) testing. Diabetes screening. This is done by checking your blood sugar (glucose) after you have not eaten for a while (fasting). You may have this done every 1-3 years. Abdominal aortic aneurysm (AAA) screening. You may need this if you are a current or former smoker. Osteoporosis. You may be screened starting at age 71 if you are at high risk. Talk with your health care provider about  your test results, treatment options, and if necessary, the need for more tests. Vaccines  Your health care provider may recommend certain vaccines, such  as: Influenza vaccine. This is recommended every year. Tetanus, diphtheria, and acellular pertussis (Tdap, Td) vaccine. You may need a Td booster every 10 years. Zoster vaccine. You may need this after age 64. Pneumococcal 13-valent conjugate (PCV13) vaccine. One dose is recommended after age 37. Pneumococcal polysaccharide (PPSV23) vaccine. One dose is recommended after age 49. Talk to your health care provider about which screenings and vaccines you need and how often you need them. This information is not intended to replace advice given to you by your health care provider. Make sure you discuss any questions you have with your health care provider. Document Released: 02/18/2015 Document Revised: 10/12/2015 Document Reviewed: 11/23/2014 Elsevier Interactive Patient Education  2017 [place] Prevention in the Home Falls can cause injuries. They can happen to people of all ages. There are many things you can do to make your home safe and to help prevent falls. What can I do on the outside of my home? Regularly fix the edges of walkways and driveways and fix any cracks. Remove anything that might make you trip as you walk through a door, such as a raised step or threshold. Trim any bushes or trees on the path to your home. Use bright outdoor lighting. Clear any walking paths of anything that might make someone trip, such as rocks or tools. Regularly check to see if handrails are loose or broken. Make sure that both sides of any steps have handrails. Any raised decks and porches should have guardrails on the edges. Have any leaves, snow, or ice cleared regularly. Use sand or salt on walking paths during winter. Clean up any spills in your garage right away. This includes oil or grease spills. What can I do in the bathroom? Use night lights. Install grab bars by the toilet and in the tub and shower. Do not use towel bars as grab bars. Use non-skid mats or decals in the tub or  shower. If you need to sit down in the shower, use a plastic, non-slip stool. Keep the floor dry. Clean up any water that spills on the floor as soon as it happens. Remove soap buildup in the tub or shower regularly. Attach bath mats securely with double-sided non-slip rug tape. Do not have throw rugs and other things on the floor that can make you trip. What can I do in the bedroom? Use night lights. Make sure that you have a light by your bed that is easy to reach. Do not use any sheets or blankets that are too big for your bed. They should not hang down onto the floor. Have a firm chair that has side arms. You can use this for support while you get dressed. Do not have throw rugs and other things on the floor that can make you trip. What can I do in the kitchen? Clean up any spills right away. Avoid walking on wet floors. Keep items that you use a lot in easy-to-reach places. If you need to reach something above you, use a strong step stool that has a grab bar. Keep electrical cords out of the way. Do not use floor polish or wax that makes floors slippery. If you must use wax, use non-skid floor wax. Do not have throw rugs and other things on the floor that can make you trip. What can I do with  my stairs? Do not leave any items on the stairs. Make sure that there are handrails on both sides of the stairs and use them. Fix handrails that are broken or loose. Make sure that handrails are as long as the stairways. Check any carpeting to make sure that it is firmly attached to the stairs. Fix any carpet that is loose or worn. Avoid having throw rugs at the top or bottom of the stairs. If you do have throw rugs, attach them to the floor with carpet tape. Make sure that you have a light switch at the top of the stairs and the bottom of the stairs. If you do not have them, ask someone to add them for you. What else can I do to help prevent falls? Wear shoes that: Do not have high heels. Have  rubber bottoms. Are comfortable and fit you well. Are closed at the toe. Do not wear sandals. If you use a stepladder: Make sure that it is fully opened. Do not climb a closed stepladder. Make sure that both sides of the stepladder are locked into place. Ask someone to hold it for you, if possible. Clearly mark and make sure that you can see: Any grab bars or handrails. First and last steps. Where the edge of each step is. Use tools that help you move around (mobility aids) if they are needed. These include: Canes. Walkers. Scooters. Crutches. Turn on the lights when you go into a dark area. Replace any light bulbs as soon as they burn out. Set up your furniture so you have a clear path. Avoid moving your furniture around. If any of your floors are uneven, fix them. If there are any pets around you, be aware of where they are. Review your medicines with your doctor. Some medicines can make you feel dizzy. This can increase your chance of falling. Ask your doctor what other things that you can do to help prevent falls. This information is not intended to replace advice given to you by your health care provider. Make sure you discuss any questions you have with your health care provider. Document Released: 11/18/2008 Document Revised: 06/30/2015 Document Reviewed: 02/26/2014 Elsevier Interactive Patient Education  2017 [employer].

## 2021-08-25 ENCOUNTER — Other Ambulatory Visit: Payer: Self-pay | Admitting: Family

## 2021-08-25 NOTE — Telephone Encounter (Signed)
Last office visit 04/17/21 Upcoming appointment 10/23/21 Last refill 03/06/21, #90, no refills

## 2021-09-01 ENCOUNTER — Other Ambulatory Visit: Payer: Self-pay | Admitting: Family

## 2021-09-01 DIAGNOSIS — R609 Edema, unspecified: Secondary | ICD-10-CM

## 2021-09-01 DIAGNOSIS — I10 Essential (primary) hypertension: Secondary | ICD-10-CM

## 2021-09-11 ENCOUNTER — Other Ambulatory Visit: Payer: Self-pay | Admitting: Family

## 2021-09-23 ENCOUNTER — Other Ambulatory Visit: Payer: Self-pay | Admitting: Family

## 2021-09-23 DIAGNOSIS — E785 Hyperlipidemia, unspecified: Secondary | ICD-10-CM

## 2021-10-17 ENCOUNTER — Encounter: Payer: PPO | Admitting: Family

## 2021-10-20 ENCOUNTER — Encounter: Payer: PPO | Admitting: Family

## 2021-10-22 ENCOUNTER — Other Ambulatory Visit: Payer: Self-pay | Admitting: Family

## 2021-10-22 DIAGNOSIS — K219 Gastro-esophageal reflux disease without esophagitis: Secondary | ICD-10-CM

## 2021-10-23 ENCOUNTER — Ambulatory Visit (INDEPENDENT_AMBULATORY_CARE_PROVIDER_SITE_OTHER): Payer: PPO | Admitting: Family

## 2021-10-23 ENCOUNTER — Encounter: Payer: Self-pay | Admitting: Family

## 2021-10-23 VITALS — BP 134/69 | HR 81 | Temp 98.1°F | Ht 69.0 in | Wt 178.2 lb

## 2021-10-23 DIAGNOSIS — I10 Essential (primary) hypertension: Secondary | ICD-10-CM | POA: Diagnosis not present

## 2021-10-23 DIAGNOSIS — E785 Hyperlipidemia, unspecified: Secondary | ICD-10-CM

## 2021-10-23 DIAGNOSIS — R7303 Prediabetes: Secondary | ICD-10-CM

## 2021-10-23 DIAGNOSIS — Z125 Encounter for screening for malignant neoplasm of prostate: Secondary | ICD-10-CM | POA: Diagnosis not present

## 2021-10-23 DIAGNOSIS — K219 Gastro-esophageal reflux disease without esophagitis: Secondary | ICD-10-CM

## 2021-10-23 DIAGNOSIS — E559 Vitamin D deficiency, unspecified: Secondary | ICD-10-CM

## 2021-10-23 DIAGNOSIS — Z0001 Encounter for general adult medical examination with abnormal findings: Secondary | ICD-10-CM | POA: Diagnosis not present

## 2021-10-23 DIAGNOSIS — E663 Overweight: Secondary | ICD-10-CM

## 2021-10-23 DIAGNOSIS — R609 Edema, unspecified: Secondary | ICD-10-CM | POA: Diagnosis not present

## 2021-10-23 DIAGNOSIS — Z1211 Encounter for screening for malignant neoplasm of colon: Secondary | ICD-10-CM

## 2021-10-23 DIAGNOSIS — H6123 Impacted cerumen, bilateral: Secondary | ICD-10-CM

## 2021-10-23 DIAGNOSIS — Z0000 Encounter for general adult medical examination without abnormal findings: Secondary | ICD-10-CM

## 2021-10-23 MED ORDER — METFORMIN HCL ER 500 MG PO TB24: ORAL_TABLET | ORAL | 2 refills | Status: DC

## 2021-10-23 MED ORDER — HYDROCHLOROTHIAZIDE 12.5 MG PO CAPS: ORAL_CAPSULE | ORAL | 2 refills | Status: DC

## 2021-10-23 MED ORDER — LISINOPRIL 10 MG PO TABS: 10.0000 mg | ORAL_TABLET | Freq: Every day | ORAL | 0 refills | Status: DC

## 2021-10-23 MED ORDER — OMEPRAZOLE 40 MG PO CPDR: 40.0000 mg | DELAYED_RELEASE_CAPSULE | Freq: Every day | ORAL | 2 refills | Status: DC

## 2021-10-23 MED ORDER — AMLODIPINE BESYLATE 10 MG PO TABS: 10.0000 mg | ORAL_TABLET | Freq: Every day | ORAL | 2 refills | Status: DC

## 2021-10-23 MED ORDER — ATORVASTATIN CALCIUM 40 MG PO TABS: 40.0000 mg | ORAL_TABLET | Freq: Every day | ORAL | 2 refills | Status: DC

## 2021-10-23 NOTE — Progress Notes (Addendum)
Subjective:    Patient ID: Charles Beltran, male    DOB: Jul 04, 1950, 71 y.o.   MRN: [id number]  Chief Complaint  Patient presents with   Annual Exam   Pt presents to the office today for CPE.  Hypertension This is a chronic problem. The current episode started more than 1 year ago. The problem has been resolved since onset. The problem is controlled. Associated symptoms include peripheral edema ("some times during the day, but goes down at night"). Pertinent negatives include no blurred vision or malaise/fatigue. Risk factors for coronary artery disease include dyslipidemia, obesity and male gender. The current treatment provides moderate improvement. There is no history of heart failure.  Gastroesophageal Reflux He complains of belching and heartburn. This is a chronic problem. The current episode started more than 1 year ago. The problem occurs occasionally. He has tried a PPI for the symptoms.  Hyperlipidemia This is a chronic problem. The current episode started more than 1 year ago. The problem is controlled. Recent lipid tests were reviewed and are normal. Exacerbating diseases include obesity. Current antihyperlipidemic treatment includes statins. The current treatment provides moderate improvement of lipids. Risk factors for coronary artery disease include dyslipidemia, diabetes mellitus, male sex, hypertension and a sedentary lifestyle.  Diabetes He presents for his follow-up diabetic visit. He has type 2 diabetes mellitus. Pertinent negatives for diabetes include no blurred vision and no foot paresthesias. Symptoms are stable. Risk factors for coronary artery disease include diabetes mellitus, dyslipidemia, hypertension, male sex and sedentary lifestyle. (Does not check BS at home)      Review of Systems  Constitutional:  Negative for malaise/fatigue.  Eyes:  Negative for blurred vision.  Gastrointestinal:  Positive for heartburn.  All other systems reviewed and are  negative.  Family History  Problem Relation Age of Onset   Epilepsy Mother    Throat cancer Father    Alcohol abuse Father    Heart attack Brother    Stroke Brother    Social History   Socioeconomic History   Marital status: Single    Spouse name: Not on file   Number of children: 0   Years of education: 16   Highest education level: Bachelor's degree (e.g., BA, AB, BS)  Occupational History   Occupation: Retired  Tobacco Use   Smoking status: Never   Smokeless tobacco: Never  Vaping Use   Vaping Use: Never used  Substance and Sexual Activity   Alcohol use: No   Drug use: No   Sexual activity: Not Currently  Other Topics Concern   Not on file  Social History Narrative   Lives alone in an apartment with an Media planner. Brother lives nearby, but most of family passed or lives out of state. No children, never married.   Social Determinants of Health   Financial Resource Strain: Low Risk  (08/03/2021)   Overall Financial Resource Strain (CARDIA)    Difficulty of Paying Living Expenses: Not hard at all  Food Insecurity: No Food Insecurity (08/03/2021)   Hunger Vital Sign    Worried About Running Out of Food in the Last Year: Never true    Ran Out of Food in the Last Year: Never true  Transportation Needs: No Transportation Needs (08/03/2021)   PRAPARE - Hydrologist (Medical): No    Lack of Transportation (Non-Medical): No  Physical Activity: Inactive (08/03/2021)   Exercise Vital Sign    Days of Exercise per Week: 0 days    Minutes  of Exercise per Session: 0 min  Stress: No Stress Concern Present (08/03/2021)   Central    Feeling of Stress : Not at all  Social Connections: Socially Isolated (08/03/2021)   Social Connection and Isolation Panel [NHANES]    Frequency of Communication with Friends and Family: Once a week    Frequency of Social Gatherings with Friends and Family:  Once a week    Attends Religious Services: Never    Marine scientist or Organizations: No    Attends Archivist Meetings: Never    Marital Status: Never married        Objective:   Physical Exam Vitals reviewed.  Constitutional:      General: He is not in acute distress.    Appearance: Normal appearance. He is well-developed.  HENT:     Head: Normocephalic.     Right Ear: Tympanic membrane normal. There is impacted cerumen.     Left Ear: Tympanic membrane normal. There is impacted cerumen.  Eyes:     General:        Right eye: No discharge.        Left eye: No discharge.     Pupils: Pupils are equal, round, and reactive to light.  Neck:     Thyroid: No thyromegaly.  Cardiovascular:     Rate and Rhythm: Normal rate and regular rhythm.     Heart sounds: Murmur heard.  Pulmonary:     Effort: Pulmonary effort is normal. No respiratory distress.     Breath sounds: Normal breath sounds. No wheezing.  Abdominal:     General: Bowel sounds are normal. There is no distension.     Palpations: Abdomen is soft.     Tenderness: There is no abdominal tenderness.  Genitourinary:    Penis: Normal.      Testes: Normal.     Prostate: Normal.     Rectum: Normal.  Musculoskeletal:        General: No tenderness. Normal range of motion.     Cervical back: Normal range of motion and neck supple.  Skin:    General: Skin is warm and dry.     Findings: No erythema or rash.  Neurological:     Mental Status: He is alert and oriented to person, place, and time.     Cranial Nerves: No cranial nerve deficit.     Deep Tendon Reflexes: Reflexes are normal and symmetric.  Psychiatric:        Behavior: Behavior normal.        Thought Content: Thought content normal.        Judgment: Judgment normal.     Bilateral ears washed with warm water and peroxide. Pt tolerated well.      BP 134/69   Pulse 81   Temp 98.1 F (36.7 C) (Temporal)   Ht _0  (1.753 m)   Wt 178 lb 3.2  oz (80.8 kg)   SpO2 99%   BMI 26.32 kg/m   Assessment & Plan:  JAYTHEN HAMME comes in today with chief complaint of Annual Exam   Diagnosis and orders addressed:  1. Gastroesophageal reflux disease, unspecified whether esophagitis present - omeprazole (PRILOSEC) 40 MG capsule; Take 1 capsule (40 mg total) by mouth daily.  Dispense: 90 capsule; Refill: 2 - CMP14+EGFR - CBC with Differential/Platelet  2. Hyperlipidemia, unspecified hyperlipidemia type - atorvastatin (LIPITOR) 40 MG tablet; Take 1 tablet (40 mg total) by mouth  daily.  Dispense: 90 tablet; Refill: 2 - CMP14+EGFR - CBC with Differential/Platelet  3. Peripheral edema - hydrochlorothiazide (MICROZIDE) 12.5 MG capsule; TAKE 1 CAPSULE BY MOUTH EVERY DAY  Dispense: 90 capsule; Refill: 2 - CMP14+EGFR - CBC with Differential/Platelet  4. Primary hypertension - hydrochlorothiazide (MICROZIDE) 12.5 MG capsule; TAKE 1 CAPSULE BY MOUTH EVERY DAY  Dispense: 90 capsule; Refill: 2 - CMP14+EGFR - CBC with Differential/Platelet  5. Annual physical exam - Cologuard - CMP14+EGFR - CBC with Differential/Platelet - Lipid panel - PSA, total and free - TSH - VITAMIN D 25 Hydroxy (Vit-D Deficiency, Fractures)  6. Prediabetes - CMP14+EGFR - CBC with Differential/Platelet  7. Overweight (BMI 25.0-29.9) - CMP14+EGFR - CBC with Differential/Platelet  8. Vitamin D deficiency - CMP14+EGFR - CBC with Differential/Platelet  9. Colon cancer screening - Cologuard - CMP14+EGFR - CBC with Differential/Platelet  10. Bilateral impacted cerumen   Labs pending Health Maintenance reviewed Diet and exercise encouraged  Follow up plan: 6 months  Evelina Dun, FNP

## 2021-10-23 NOTE — Patient Instructions (Signed)
Health Maintenance After Age 70 After age 70, you are at a higher risk for certain long-term diseases and infections as well as injuries from falls. Falls are a major cause of broken bones and head injuries in people who are older than age 70. Getting regular preventive care can help to keep you healthy and well. Preventive care includes getting regular testing and making lifestyle changes as recommended by your health care provider. Talk with your health care provider about: Which screenings and tests you should have. A screening is a test that checks for a disease when you have no symptoms. A diet and exercise plan that is right for you. What should I know about screenings and tests to prevent falls? Screening and testing are the best ways to find a health problem early. Early diagnosis and treatment give you the best chance of managing medical conditions that are common after age 70. Certain conditions and lifestyle choices may make you more likely to have a fall. Your health care provider may recommend: Regular vision checks. Poor vision and conditions such as cataracts can make you more likely to have a fall. If you wear glasses, make sure to get your prescription updated if your vision changes. Medicine review. Work with your health care provider to regularly review all of the medicines you are taking, including over-the-counter medicines. Ask your health care provider about any side effects that may make you more likely to have a fall. Tell your health care provider if any medicines that you take make you feel dizzy or sleepy. Strength and balance checks. Your health care provider may recommend certain tests to check your strength and balance while standing, walking, or changing positions. Foot health exam. Foot pain and numbness, as well as not wearing proper footwear, can make you more likely to have a fall. Screenings, including: Osteoporosis screening. Osteoporosis is a condition that causes  the bones to get weaker and break more easily. Blood pressure screening. Blood pressure changes and medicines to control blood pressure can make you feel dizzy. Depression screening. You may be more likely to have a fall if you have a fear of falling, feel depressed, or feel unable to do activities that you used to do. Alcohol use screening. Using too much alcohol can affect your balance and may make you more likely to have a fall. Follow these instructions at home: Lifestyle Do not drink alcohol if: Your health care provider tells you not to drink. If you drink alcohol: Limit how much you have to: 0-1 drink a day for women. 0-2 drinks a day for men. Know how much alcohol is in your drink. In the [place] one drink equals one 12 oz bottle of beer (355 mL), one 5 oz glass of wine (148 mL), or one 1 oz glass of hard liquor (44 mL). Do not use any products that contain nicotine or tobacco. These products include cigarettes, chewing tobacco, and vaping devices, such as e-cigarettes. If you need help quitting, ask your health care provider. Activity  Follow a regular exercise program to stay fit. This will help you maintain your balance. Ask your health care provider what types of exercise are appropriate for you. If you need a cane or walker, use it as recommended by your health care provider. Wear supportive shoes that have nonskid soles. Safety  Remove any tripping hazards, such as rugs, cords, and clutter. Install safety equipment such as grab bars in bathrooms and safety rails on stairs. Keep rooms and walkways   well-lit. General instructions Talk with your health care provider about your risks for falling. Tell your health care provider if: You fall. Be sure to tell your health care provider about all falls, even ones that seem minor. You feel dizzy, tiredness (fatigue), or off-balance. Take over-the-counter and prescription medicines only as told by your health care provider. These include  supplements. Eat a healthy diet and maintain a healthy weight. A healthy diet includes low-fat dairy products, low-fat (lean) meats, and fiber from whole grains, beans, and lots of fruits and vegetables. Stay current with your vaccines. Schedule regular health, dental, and eye exams. Summary Having a healthy lifestyle and getting preventive care can help to protect your health and wellness after age 70. Screening and testing are the best way to find a health problem early and help you avoid having a fall. Early diagnosis and treatment give you the best chance for managing medical conditions that are more common for people who are older than age 70. Falls are a major cause of broken bones and head injuries in people who are older than age 70. Take precautions to prevent a fall at home. Work with your health care provider to learn what changes you can make to improve your health and wellness and to prevent falls. This information is not intended to replace advice given to you by your health care provider. Make sure you discuss any questions you have with your health care provider. Document Revised: 06/13/2020 Document Reviewed: 06/13/2020 Elsevier Patient Education  2023 [employer]  

## 2021-10-24 LAB — CBC WITH DIFFERENTIAL/PLATELET
Basophils Absolute: 0 10*3/uL (ref 0.0–0.2)
Basos: 0 %
EOS (ABSOLUTE): 0.2 10*3/uL (ref 0.0–0.4)
Eos: 2 %
Hematocrit: 45.5 % (ref 37.5–51.0)
Hemoglobin: 15.5 g/dL (ref 13.0–17.7)
Immature Grans (Abs): 0 10*3/uL (ref 0.0–0.1)
Immature Granulocytes: 0 %
Lymphocytes Absolute: 1.8 10*3/uL (ref 0.7–3.1)
Lymphs: 16 %
MCH: 29.7 pg (ref 26.6–33.0)
MCHC: 34.1 g/dL (ref 31.5–35.7)
MCV: 87 fL (ref 79–97)
Monocytes Absolute: 1.3 10*3/uL — ABNORMAL HIGH (ref 0.1–0.9)
Monocytes: 12 %
Neutrophils Absolute: 7.9 10*3/uL — ABNORMAL HIGH (ref 1.4–7.0)
Neutrophils: 70 %
Platelets: 199 10*3/uL (ref 150–450)
RBC: 5.22 x10E6/uL (ref 4.14–5.80)
RDW: 13.2 % (ref 11.6–15.4)
WBC: 11.3 10*3/uL — ABNORMAL HIGH (ref 3.4–10.8)

## 2021-10-24 LAB — VITAMIN D 25 HYDROXY (VIT D DEFICIENCY, FRACTURES): Vit D, 25-Hydroxy: 42.1 ng/mL (ref 30.0–100.0)

## 2021-10-24 LAB — CMP14+EGFR
ALT: 37 IU/L (ref 0–44)
AST: 24 IU/L (ref 0–40)
Albumin/Globulin Ratio: 1.9 (ref 1.2–2.2)
Albumin: 5 g/dL — ABNORMAL HIGH (ref 3.9–4.9)
Alkaline Phosphatase: 76 IU/L (ref 44–121)
BUN/Creatinine Ratio: 16 (ref 10–24)
BUN: 15 mg/dL (ref 8–27)
Bilirubin Total: 0.8 mg/dL (ref 0.0–1.2)
CO2: 23 mmol/L (ref 20–29)
Calcium: 9.9 mg/dL (ref 8.6–10.2)
Chloride: 96 mmol/L (ref 96–106)
Creatinine, Ser: 0.91 mg/dL (ref 0.76–1.27)
Globulin, Total: 2.6 g/dL (ref 1.5–4.5)
Glucose: 187 mg/dL — ABNORMAL HIGH (ref 70–99)
Potassium: 4.2 mmol/L (ref 3.5–5.2)
Sodium: 137 mmol/L (ref 134–144)
Total Protein: 7.6 g/dL (ref 6.0–8.5)
eGFR: 91 mL/min/{1.73_m2} (ref >59)

## 2021-10-24 LAB — LIPID PANEL
Chol/HDL Ratio: 2.4 ratio (ref 0.0–5.0)
Cholesterol, Total: 139 mg/dL (ref 100–199)
HDL: 59 mg/dL (ref >39)
LDL Chol Calc (NIH): 64 mg/dL (ref 0–99)
Triglycerides: 86 mg/dL (ref 0–149)
VLDL Cholesterol Cal: 16 mg/dL (ref 5–40)

## 2021-10-24 LAB — PSA, TOTAL AND FREE
PSA, Free Pct: 35.4 %
PSA, Free: 0.46 ng/mL
Prostate Specific Ag, Serum: 1.3 ng/mL (ref 0.0–4.0)

## 2021-10-24 LAB — TSH: TSH: 2.77 u[IU]/mL (ref 0.450–4.500)

## 2021-10-30 LAB — HGB A1C W/O EAG: Hgb A1c MFr Bld: 7.4 % — ABNORMAL HIGH (ref 4.8–5.6)

## 2021-10-30 LAB — SPECIMEN STATUS REPORT

## 2021-10-31 ENCOUNTER — Other Ambulatory Visit: Payer: Self-pay | Admitting: Family

## 2021-10-31 MED ORDER — METFORMIN HCL ER 750 MG PO TB24: 750.0000 mg | ORAL_TABLET | Freq: Every day | ORAL | 1 refills | Status: DC

## 2021-11-01 ENCOUNTER — Telehealth: Payer: Self-pay | Admitting: Family

## 2021-11-01 NOTE — Telephone Encounter (Signed)
Having diarrhea for 3 years. Ok to wait until Alyse Low returns tomorrow to ask if it can be changed.  Made aware of A1C and blood glucose results.  Reviewed Christy's recommendations with pt.  Pt had many concerns about how and when to take sugars, what should he eat and drink.  Informed pt that he may need another appt.

## 2021-11-02 ENCOUNTER — Other Ambulatory Visit: Payer: Self-pay | Admitting: Family Medicine

## 2021-11-02 DIAGNOSIS — R7303 Prediabetes: Secondary | ICD-10-CM

## 2021-11-02 DIAGNOSIS — I10 Essential (primary) hypertension: Secondary | ICD-10-CM

## 2021-11-02 DIAGNOSIS — E785 Hyperlipidemia, unspecified: Secondary | ICD-10-CM

## 2021-11-02 MED ORDER — EMPAGLIFLOZIN 10 MG PO TABS: 10.0000 mg | ORAL_TABLET | Freq: Every day | ORAL | 1 refills | Status: DC

## 2021-11-02 NOTE — Telephone Encounter (Signed)
Patient aware and verbalized understanding. °

## 2021-11-02 NOTE — Telephone Encounter (Signed)
Metformin changed to Jardiance 10 mg.

## 2021-11-03 ENCOUNTER — Telehealth: Payer: Self-pay

## 2021-11-03 NOTE — Addendum Note (Signed)
Addended by: Lottie Dawson D on: 11/03/2021 02:50 PM   Modules accepted: Orders

## 2021-11-03 NOTE — Chronic Care Management (AMB) (Signed)
  Chronic Care Management   Note  11/03/2021 Name: Charles Beltran MRN: [id number] DOB: 03-31-1950  [name] is a 71 y.o. year old male who is a primary care patient of Sharion Balloon, FNP. I reached out to [name] by phone today in response to a referral sent by [name] PCP.  Mr. Kenley was given information about Chronic Care Management services today including:  CCM service includes personalized support from designated clinical staff supervised by his physician, including individualized plan of care and coordination with other care providers 24/7 contact phone numbers for assistance for urgent and routine care needs. Service will only be billed when office clinical staff spend 20 minutes or more in a month to coordinate care. Only one practitioner may furnish and bill the service in a calendar month. The patient may stop CCM services at any time (effective at the end of the month) by phone call to the office staff. The patient is responsible for co-pay (up to 20% after annual deductible is met) if co-pay is required by the individual health plan.   Patient agreed to services and verbal consent obtained.   Follow up plan: Telephone appointment with care management team member scheduled for:11/22/2021  Noreene Larsson, [place], [place] [address] Direct Dial: [phone] Kynleigh [email or web address]

## 2021-11-22 ENCOUNTER — Encounter: Payer: Self-pay | Admitting: Pharmacist

## 2021-11-22 ENCOUNTER — Ambulatory Visit (INDEPENDENT_AMBULATORY_CARE_PROVIDER_SITE_OTHER): Payer: PPO | Admitting: Pharmacist

## 2021-11-22 VITALS — Wt 174.2 lb

## 2021-11-22 DIAGNOSIS — E119 Type 2 diabetes mellitus without complications: Secondary | ICD-10-CM

## 2021-11-22 DIAGNOSIS — I10 Essential (primary) hypertension: Secondary | ICD-10-CM

## 2021-11-22 MED ORDER — ONETOUCH VERIO FLEX SYSTEM W/DEVICE KIT: PACK | 1 refills | Status: DC

## 2021-11-22 MED ORDER — ONETOUCH DELICA LANCETS 33G MISC: 11 refills | Status: DC

## 2021-11-22 MED ORDER — ONETOUCH VERIO VI STRP: ORAL_STRIP | 12 refills | Status: DC

## 2021-11-22 NOTE — Progress Notes (Signed)
Chronic Care Management Pharmacy Note  11/22/2021 Name:  Charles Beltran MRN:  [id number] DOB:  Jun 12, 1950  Summary:  Diabetes: New goal. Uncontrolled--A1C 7.4% NEW ONSET T2DM; current treatment: jardiance 58m daily;  Current glucose readings:unable to test, current HTA insurance covers one touch verio flex Completed review and glucometer education  Comprehensive dietary information reviewed and printed for patient Discussed meal planning options and Plate method for healthy eating Avoid sugary drinks and desserts Incorporate balanced protein, non starchy veggies, 1 serving of carbohydrate with each meal Increase water intake Increase physical activity as able Current exercise: encouraged Reviewed cholesterol and blood pressure Continue current therapy Educated on glucometer, jardiance, dietary  Patient Goals/Self-Care Activities patient will:  - take medications as prescribed as evidenced by patient report and record review check glucose fasting/3 times weekly, document, and provide at future appointments target a minimum of 150 minutes of moderate intensity exercise weekly engage in dietary modifications by FOLLOWING A HEART HEALTHY DIET/HEALTHY PLATE METHOD  Subjective: Charles HENDONis an 71y.o. year old male who is a primary patient of HSharion Balloon FNP.  The patient was referred to the Chronic Care Management team for assistance with care management needs subsequent to provider initiation of CCM services and plan of care.    Engaged with patient face to face for initial visit in response to provider referral for CCM services.   Objective:  LABS:   Lab Results  Component Value Date   CREATININE 0.91 10/23/2021   CREATININE 0.87 06/27/2021   CREATININE 0.91 05/22/2021     Lab Results  Component Value Date   HGBA1C 7.4 (H) 10/23/2021         Component Value Date/Time   CHOL 139 10/23/2021 0938   CHOL 138 07/18/2012 1022   TRIG 86 10/23/2021 0938   TRIG  56 06/18/2014 0854   TRIG 79 07/18/2012 1022   HDL 59 10/23/2021 0938   HDL 58 06/18/2014 0854   HDL 52 07/18/2012 1022   CHOLHDL 2.4 10/23/2021 0938   LDLCALC 64 10/23/2021 0938   LDLCALC 50 11/25/2013 0912   LDLCALC 70 07/18/2012 1022     Clinical ASCVD: No   The 10-year ASCVD risk score (Arnett DK, et al., 2019) is: 31.2%   Values used to calculate the score:     Age: [age]years     Sex: Male     Is Non-Hispanic African American: No     Diabetic: Yes     Tobacco smoker: No     Systolic Blood Pressure: 1086mmHg     Is BP treated: Yes     HDL Cholesterol: 59 mg/dL     Total Cholesterol: 139 mg/dL    Other: (CHADS2VASc if Afib, PHQ9 if depression, MMRC or CAT for COPD, ACT, DEXA)    BP Readings from Last 3 Encounters:  10/23/21 134/69  06/27/21 (!) 109/59  06/06/21 (!) 131/59      SDOH:  (Social Determinants of Health) assessments and interventions performed:    Allergies  Allergen Reactions   Penicillins Shortness Of Breath   Metformin And Related Diarrhea    Medications Reviewed Today     Reviewed by [facility]Lavera Guise [facility](Pharmacist) on 12/08/21 at 1203  Med List Status: <None>   Medication Order Taking? Sig Documenting Provider Last Dose Status Informant  amLODipine (NORVASC) 10 MG tablet [id number] Take 1 tablet (10 mg total) by mouth daily. HSharion Balloon FNP  Active   atorvastatin (LIPITOR)  40 MG tablet [id number]  Take 1 tablet (40 mg total) by mouth daily. Evelina Dun A, FNP  Active   Blood Glucose Monitoring Suppl ([place]) w/Device KIT [id number] Yes Use to test blood sugar daily as directed; DX:E11.9 Evelina Dun A, FNP  Active   Cholecalciferol (VITAMIN D) 2000 UNITS tablet [id number]  Take 2,000 Units by mouth daily. [provider]  Active Self  empagliflozin (JARDIANCE) 10 MG TABS tablet [id number]  Take 1 tablet (10 mg total) by mouth daily before breakfast. Evelina Dun A, FNP  Active   fish oil-omega-3 fatty acids 1000  MG capsule [id number]  Take 1 g by mouth. One bid [provider]  Active Self  glucose blood (ONETOUCH VERIO) test strip [id number] Yes Use as instructed to test blood sugar daily. DX: E11.9 Evelina Dun A, FNP  Active   hydrochlorothiazide (MICROZIDE) 12.5 MG capsule [id number]  TAKE 1 CAPSULE BY MOUTH EVERY DAY Hawks, Christy A, FNP  Active   lisinopril (ZESTRIL) 10 MG tablet [id number]  Take 1 tablet (10 mg total) by mouth daily. Evelina Dun A, FNP  Active   omeprazole (PRILOSEC) 40 MG capsule [id number]  Take 1 capsule (40 mg total) by mouth daily. Sharion Balloon, FNP  Active   OneTouch Delica Lancets 12R MISC [id number] Yes Use to test blood sugar daily as directed; DX:E11.9 Sharion Balloon, FNP  Active               Goals Addressed               This Visit's Progress     Patient Stated     T2DM, HTN, HLD (pt-stated)        Current Barriers:  Unable to independently monitor therapeutic efficacy Unable to achieve control of T2DM, HTN  UNABLE TO TEST BLOOD SUGAR AT HOME  Pharmacist Clinical Goal(s):  patient will achieve adherence to monitoring guidelines and medication adherence to achieve therapeutic efficacy achieve ability to self administer medications as prescribed as evidenced by patient report through collaboration with PharmD and provider.   Interventions: 1:1 collaboration with Sharion Balloon, FNP regarding development and update of comprehensive plan of care as evidenced by provider attestation and co-signature Inter-disciplinary care team collaboration (see longitudinal plan of care) Comprehensive medication review performed; medication list updated in electronic medical record  Diabetes: New goal. Uncontrolled--A1C 7.4% NEW ONSET T2DM; current treatment: jardiance 53m daily;  Current glucose readings:unable to test, current HTA insurance covers one touch verio flex Completed review and glucometer education  Comprehensive dietary information  reviewed and printed for patient Discussed meal planning options and Plate method for healthy eating Avoid sugary drinks and desserts Incorporate balanced protein, non starchy veggies, 1 serving of carbohydrate with each meal Increase water intake Increase physical activity as able Current exercise: encouraged Reviewed cholesterol and blood pressure Continue current therapy Educated on glucometer, jardiance, dietary  Patient Goals/Self-Care Activities patient will:  - take medications as prescribed as evidenced by patient report and record review check glucose fasting/3 times weekly, document, and provide at future appointments target a minimum of 150 minutes of moderate intensity exercise weekly engage in dietary modifications by FOLLOWING A HEART HEALTHY DIET/HEALTHY PLATE METHOD          Plan: Face to Face appointment with care management team member scheduled for: NEXT WEEK     JRegina Eck PharmD, BCPS Clinical Pharmacist, [place] Phone [phone]

## 2021-11-24 ENCOUNTER — Telehealth: Payer: Self-pay | Admitting: *Deleted

## 2021-11-24 ENCOUNTER — Encounter: Payer: Self-pay | Admitting: *Deleted

## 2021-11-24 DIAGNOSIS — E119 Type 2 diabetes mellitus without complications: Secondary | ICD-10-CM

## 2021-11-24 DIAGNOSIS — I10 Essential (primary) hypertension: Secondary | ICD-10-CM

## 2021-11-24 NOTE — Chronic Care Management (AMB) (Signed)
   11/24/2021  [name] 1950-07-31 [id number]   Note in error

## 2021-11-24 NOTE — Telephone Encounter (Signed)
   CCM RN Visit Note   @DATE @ Name: [name] MRN: [id number]      DOB: January 10, 1951  Subjective: Charles Beltran is a 71 y.o. year old male who is a primary care patient of Evelina Dun FNP. The patient was referred to the Chronic Care Management team for assistance with care management needs subsequent to provider initiation of CCM services and plan of care.       An unsuccessful telephone outreach was attempted today to contact the patient about Chronic Care Management needs.    Plan:Telephone follow up appointment with care management team member scheduled for:  upon care guide rescheduling.  Jacqlyn Larsen RNC, BSN RN Case Manager [place] [phone]

## 2021-11-24 NOTE — Patient Outreach (Signed)
ERROR

## 2021-11-28 ENCOUNTER — Ambulatory Visit (INDEPENDENT_AMBULATORY_CARE_PROVIDER_SITE_OTHER): Payer: PPO | Admitting: Pharmacist

## 2021-11-28 DIAGNOSIS — R7303 Prediabetes: Secondary | ICD-10-CM | POA: Diagnosis not present

## 2021-11-28 NOTE — Progress Notes (Signed)
     11/28/2021 Name: Charles Beltran MRN: [id number] DOB: [birth year]   S:  71 yoM Presents for diabetes evaluation, education, and management.  He is coming in today to help with one touch verio supplies.  We will provide education on new glucometer  Insurance coverage/medication affordability: health team advantage  Patient reports adherence with medications. Current diabetes medications include: jardiance 10mg  Current hypertension medications include: norvasc, lisinopril Goal 130/80 Current hyperlipidemia medications include: atorvastatin   Patient denies hypoglycemic events.   Patient reported dietary habits: Eats 3 meals/day Discussed meal planning options and Plate method for healthy eating Avoid sugary drinks and desserts Incorporate balanced protein, non starchy veggies, 1 serving of carbohydrate with each meal Increase water intake Increase physical activity as able  Patient-reported exercise habits:  n/a  O:  Lab Results  Component Value Date   HGBA1C 7.4 (H) 10/23/2021    Lipid Panel     Component Value Date/Time   CHOL 139 10/23/2021 0938   CHOL 138 07/18/2012 1022   TRIG 86 10/23/2021 0938   TRIG 56 06/18/2014 0854   TRIG 79 07/18/2012 1022   HDL 59 10/23/2021 0938   HDL 58 06/18/2014 0854   HDL 52 07/18/2012 1022   CHOLHDL 2.4 10/23/2021 0938   LDLCALC 64 10/23/2021 0938   LDLCALC 50 11/25/2013 0912   LDLCALC 70 07/18/2012 1022    Clinical Atherosclerotic Cardiovascular Disease (ASCVD): No   The 10-year ASCVD risk score (Arnett DK, et al., 2019) is: 31.2%   Values used to calculate the score:     Age: 71 years     Sex: Male     Is Non-Hispanic African American: No     Diabetic: Yes     Tobacco smoker: No     Systolic Blood Pressure: 628 mmHg     Is BP treated: Yes     HDL Cholesterol: 59 mg/dL     Total Cholesterol: 139 mg/dL    A/P:  One touch verio flex system education and set up provided for patient   -Extensively discussed  pathophysiology of diabetes, recommended lifestyle interventions, dietary effects on blood sugar control  -Counseled on s/sx of and management of hypoglycemia    Written patient instructions provided.  Total time in face to face counseling 25 minutes.    Regina Eck, PharmD, BCPS Clinical Pharmacist, [place]  [place] Phone [phone]

## 2021-11-30 ENCOUNTER — Other Ambulatory Visit: Payer: Self-pay | Admitting: Family

## 2021-11-30 DIAGNOSIS — R7303 Prediabetes: Secondary | ICD-10-CM

## 2021-11-30 DIAGNOSIS — I10 Essential (primary) hypertension: Secondary | ICD-10-CM

## 2021-11-30 DIAGNOSIS — E785 Hyperlipidemia, unspecified: Secondary | ICD-10-CM

## 2021-12-04 ENCOUNTER — Encounter: Payer: Self-pay | Admitting: *Deleted

## 2021-12-04 DIAGNOSIS — E785 Hyperlipidemia, unspecified: Secondary | ICD-10-CM

## 2021-12-04 DIAGNOSIS — I10 Essential (primary) hypertension: Secondary | ICD-10-CM

## 2021-12-04 NOTE — Chronic Care Management (AMB) (Signed)
ERROR

## 2021-12-05 DIAGNOSIS — Z7984 Long term (current) use of oral hypoglycemic drugs: Secondary | ICD-10-CM

## 2021-12-05 DIAGNOSIS — E119 Type 2 diabetes mellitus without complications: Secondary | ICD-10-CM

## 2021-12-08 DIAGNOSIS — E119 Type 2 diabetes mellitus without complications: Secondary | ICD-10-CM | POA: Insufficient documentation

## 2021-12-08 NOTE — Patient Instructions (Addendum)
Visit Information  Following are the goals we discussed today:  Current Barriers:  Unable to independently monitor therapeutic efficacy Unable to achieve control of T2DM, HTN  UNABLE TO TEST BLOOD SUGAR AT HOME  Pharmacist Clinical Goal(s):  patient will achieve adherence to monitoring guidelines and medication adherence to achieve therapeutic efficacy achieve ability to self administer medications as prescribed as evidenced by patient report through collaboration with PharmD and provider.   Interventions: 1:1 collaboration with Sharion Balloon, FNP regarding development and update of comprehensive plan of care as evidenced by provider attestation and co-signature Inter-disciplinary care team collaboration (see longitudinal plan of care) Comprehensive medication review performed; medication list updated in electronic medical record  Diabetes: New goal. Uncontrolled--A1C 7.4% NEW ONSET T2DM; current treatment: jardiance 10mg  daily;  Current glucose readings:unable to test, current HTA insurance covers one touch verio flex Completed review and glucometer education  Comprehensive dietary information reviewed and printed for patient Discussed meal planning options and Plate method for healthy eating Avoid sugary drinks and desserts Incorporate balanced protein, non starchy veggies, 1 serving of carbohydrate with each meal Increase water intake Increase physical activity as able Current exercise: encouraged Reviewed cholesterol and blood pressure Continue current therapy Educated on glucometer, jardiance, dietary  Patient Goals/Self-Care Activities patient will:  - take medications as prescribed as evidenced by patient report and record review check glucose fasting/3 times weekly, document, and provide at future appointments target a minimum of 150 minutes of moderate intensity exercise weekly engage in dietary modifications by FOLLOWING A HEART HEALTHY DIET/HEALTHY PLATE  METHOD    Plan: Telephone follow up appointment with care management team member scheduled for:  3 MONTHS  Signature Regina Eck, PharmD, BCPS Clinical Pharmacist, [place]  [facility] Phone [phone]  Please call the care guide team at [phone] if you need to cancel or reschedule your appointment.   The patient verbalized understanding of instructions, educational materials, and care plan provided today and DECLINED offer to receive copy of patient instructions, educational materials, and care plan.

## 2021-12-11 ENCOUNTER — Telehealth: Payer: Self-pay

## 2021-12-11 NOTE — Progress Notes (Signed)
  Chronic Care Management   Note  12/11/2021 Name: Charles Beltran MRN: [id number] DOB: 1950-09-07  Charles Beltran is a 71 y.o. year old male who is a primary care patient of Sharion Balloon, FNP. I reached out to Charles Beltran by phone today in response to a referral sent by Charles Beltran PCP.  Charles Beltran  agreedto scheduling an appointment with the CCM RN Case Manager   Follow up plan: Patient agreed to scheduled appointment with RN Case Manager on 12/20/2021.   Noreene Larsson, [place], [place] [address] Direct Dial: [phone] [email or web address]

## 2021-12-11 NOTE — Progress Notes (Signed)
  Chronic Care Management   Note  12/11/2021 Name: Charles Beltran MRN: [id number] DOB: Jul 11, 1950  Charles Beltran is a 72 y.o. year old male who is a primary care patient of Sharion Balloon, FNP. I reached out to Charles Beltran by phone today in response to a referral sent by Charles Beltran PCP.  Charles Beltran was not successfully contacted today. A HIPAA compliant voice message was left requesting a return call.   Follow up plan: Additional outreach attempts will be made.  Noreene Larsson, [place], [place] [address] Direct Dial: [phone] [email or web address]

## 2021-12-20 ENCOUNTER — Telehealth: Payer: PPO

## 2021-12-20 ENCOUNTER — Ambulatory Visit (INDEPENDENT_AMBULATORY_CARE_PROVIDER_SITE_OTHER): Payer: PPO | Admitting: *Deleted

## 2021-12-20 DIAGNOSIS — I10 Essential (primary) hypertension: Secondary | ICD-10-CM

## 2021-12-20 DIAGNOSIS — E785 Hyperlipidemia, unspecified: Secondary | ICD-10-CM

## 2021-12-20 NOTE — Plan of Care (Signed)
Chronic Care Management Provider Comprehensive Care Plan    12/20/2021 Name: Charles Beltran MRN: [id number] DOB: [birth year]  Referral to Chronic Care Management (CCM) services was placed by Provider:  Evelina Dun FNP on Date: 11/30/21.  Chronic Condition 1: HYPERTENSION Provider Assessment and Plan   Primary hypertension - hydrochlorothiazide (MICROZIDE) 12.5 MG capsule; TAKE 1 CAPSULE BY MOUTH EVERY DAY  Dispense: 90 capsule; Refill: 2 - CMP14+EGFR - CBC with Differential/Platelet  Expected Outcome/Goals Addressed This Visit (Provider CCM goals/Provider Assessment and plan  CCM (HYPERTENSION) EXPECTED OUTCOME: MONITOR, SELF-MANAGE AND REDUCE SYMPTOMS OF HYPERTENSION  Symptom Management Condition 1: Take medications as prescribed   Attend all scheduled provider appointments Call pharmacy for medication refills 3-7 days in advance of running out of medications Perform all self care activities independently  Perform IADL's (shopping, preparing meals, housekeeping, managing finances) independently Call provider office for new concerns or questions  check blood pressure weekly choose a place to take my blood pressure (home, clinic or office, retail store) write blood pressure results in a log or diary keep a blood pressure log take blood pressure log to all doctor appointments keep all doctor appointments take medications for blood pressure exactly as prescribed begin an exercise program report new symptoms to your doctor eat more whole grains, fruits and vegetables, lean meats and healthy fats Consider starting to check blood pressure at home and keeping a log  Chronic Condition 2: HYPERLIPIDEMIA Provider Assessment and Plan Hyperlipidemia, unspecified hyperlipidemia type - atorvastatin (LIPITOR) 40 MG tablet; Take 1 tablet (40 mg total) by mouth daily.  Dispense: 90 tablet; Refill: 2 - CMP14+EGFR - CBC with Differential/Platelet   Expected Outcome/Goals Addressed This Visit  (Provider CCM goals/Provider Assessment and plan  CCM (HYPERLIPIDEMIA) EXPECTED OUTCOME: MONITOR, SELF-MANAGE AND REDUCE SYMPTOMS OF HYPERLIPIDEMIA  Symptom Management Condition 2: Take medications as prescribed   Attend all scheduled provider appointments Call pharmacy for medication refills 3-7 days in advance of running out of medications Perform all self care activities independently  Perform IADL's (shopping, preparing meals, housekeeping, managing finances) independently Call provider office for new concerns or questions  - call for medicine refill 2 or 3 days before it runs out - take all medications exactly as prescribed - call doctor with any symptoms you believe are related to your medicine - call doctor when you experience any new symptoms - go to all doctor appointments as scheduled - adhere to prescribed diet: heart healthy - develop an exercise routine Start checking blood sugar as ordered by your doctor  Problem List Patient Active Problem List   Diagnosis Date Noted   Diabetes mellitus without complication ([place]) 10/62/6948   GERD (gastroesophageal reflux disease) 07/26/2017   Overweight (BMI 25.0-29.9) 07/22/2015   Vitamin D deficiency 10/05/2014   Heel spur 03/08/2014   HTN (hypertension) 05/01/2010   Hyperlipidemia 05/01/2010    Medication Management  Current Outpatient Medications:    amLODipine (NORVASC) 10 MG tablet, Take 1 tablet (10 mg total) by mouth daily., Disp: 90 tablet, Rfl: 2   atorvastatin (LIPITOR) 40 MG tablet, Take 1 tablet (40 mg total) by mouth daily., Disp: 90 tablet, Rfl: 2   Cholecalciferol (VITAMIN D) 2000 UNITS tablet, Take 2,000 Units by mouth daily., Disp: , Rfl:    empagliflozin (JARDIANCE) 10 MG TABS tablet, Take 1 tablet (10 mg total) by mouth daily before breakfast., Disp: 90 tablet, Rfl: 1   fish oil-omega-3 fatty acids 1000 MG capsule, Take 1 g by mouth. One bid, Disp: , Rfl:  hydrochlorothiazide (MICROZIDE) 12.5 MG capsule,  TAKE 1 CAPSULE BY MOUTH EVERY DAY, Disp: 90 capsule, Rfl: 2   lisinopril (ZESTRIL) 10 MG tablet, Take 1 tablet (10 mg total) by mouth daily., Disp: 90 tablet, Rfl: 0   omeprazole (PRILOSEC) 40 MG capsule, Take 1 capsule (40 mg total) by mouth daily., Disp: 90 capsule, Rfl: 2   Blood Glucose Monitoring Suppl (ONETOUCH VERIO FLEX SYSTEM) w/Device KIT, Use to test blood sugar daily as directed; DX:E11.9 (Patient not taking: Reported on 12/20/2021), Disp: 1 kit, Rfl: 1   glucose blood (ONETOUCH VERIO) test strip, Use as instructed to test blood sugar daily. DX: E11.9 (Patient not taking: Reported on 12/20/2021), Disp: 50 each, Rfl: 12   OneTouch Delica Lancets 23F MISC, Use to test blood sugar daily as directed; DX:E11.9 (Patient not taking: Reported on 12/20/2021), Disp: 100 each, Rfl: 11  Cognitive Assessment Identity Confirmed: : Name; DOB Cognitive Status: Normal   Functional Assessment Hearing Difficulty or Deaf: no Wear Glasses or Blind: yes Vision Management: reading glasses Concentrating, Remembering or Making Decisions Difficulty (CP): no Difficulty Communicating: no Difficulty Eating/Swallowing: no Walking or Climbing Stairs Difficulty: no Dressing/Bathing Difficulty: no Doing Errands Independently Difficulty (such as shopping) (CP): no   Caregiver Assessment  Primary Source of Support/Comfort: -- (pt states no one particular person) Name of Support/Comfort Primary Source: pt lives alone and states he has someone he can call of if needed People in Home: alone Name(s) of People in Home: lives alone   Planned Interventions  Evaluation of current treatment plan related to hypertension self management and patient's adherence to plan as established by provider;   Reviewed prescribed diet low sodium Reviewed medications with patient and discussed importance of compliance;  Counseled on the importance of exercise goals with target of 150 minutes per week Discussed plans with  patient for ongoing care management follow up and provided patient with direct contact information for care management team; Advised patient, providing education and rationale, to monitor blood pressure daily and record, calling PCP for findings outside established parameters;  Provided education on prescribed diet low sodium;  Discussed complications of poorly controlled blood pressure such as heart disease, stroke, circulatory complications, vision complications, kidney impairment, sexual dysfunction;  Screening for signs and symptoms of depression related to chronic disease state;  Assessed social determinant of health barriers;  Provider established cholesterol goals reviewed; Counseled on importance of regular laboratory monitoring as prescribed; Provided HLD educational materials; Reviewed role and benefits of statin for ASCVD risk reduction; Reviewed importance of limiting foods high in cholesterol; Reviewed exercise goals and target of 150 minutes per week; Screening for signs and symptoms of depression related to chronic disease state;  Assessed social determinant of health barriers;  Education provided heart healthy diet  Interaction and coordination with outside resources, practitioners, and providers See CCM Referral  Care Plan: Patient declined

## 2021-12-20 NOTE — Patient Instructions (Signed)
Please call the care guide team at [phone] if you need to cancel or reschedule your appointment.   If you are experiencing a Mental Health or Behavioral Health Crisis or need someone to talk to, please call the Suicide and Crisis Lifeline: 988 call the [place] National Suicide Prevention Lifeline: 7403141984 or TTY: 346 537 7237 TTY [phone]) to talk to a trained counselor call 1-800-273-TALK (toll free, 24 hour hotline) go to [facility] [address], [place] [phone]) call the [facility]: [phone] call 911   Following is a copy of your full provider care plan:   Goals Addressed             This Visit's Progress    CCM (HYPERLIPIDEMIA) EXPECTED OUTCOME: MONITOR, SELF-MANAGE AND REDUCE SYMPTOMS OF HYPERLIPIDEMIA       Current Barriers:  Knowledge Deficits related to management of Hyperlipidemia Chronic Disease Management support and education needs related to Hyperlipidemia and diet No Advanced Directives in place- pt declines information Patient reports he is independent with all aspects of his care, continues to drive, lives alone, does not follow a special diet, does not exercise. Patient has diagnosis Pre-diabetes, is on jardiance, has glucometer but has not checked CBG, pt is contemplating and states he may decide to start checking, reports "pharmacist helped me and I know how"  Planned Interventions: Provider established cholesterol goals reviewed; Counseled on importance of regular laboratory monitoring as prescribed; Provided HLD educational materials; Reviewed role and benefits of statin for ASCVD risk reduction; Reviewed importance of limiting foods high in cholesterol; Reviewed exercise goals and target of 150 minutes per week; Screening for signs and symptoms of depression related to chronic disease state;  Assessed social determinant of health barriers;  Education provided heart healthy  diet  Symptom Management: Take medications as prescribed   Attend all scheduled provider appointments Call pharmacy for medication refills 3-7 days in advance of running out of medications Perform all self care activities independently  Perform IADL's (shopping, preparing meals, housekeeping, managing finances) independently Call provider office for new concerns or questions  - call for medicine refill 2 or 3 days before it runs out - take all medications exactly as prescribed - call doctor with any symptoms you believe are related to your medicine - call doctor when you experience any new symptoms - go to all doctor appointments as scheduled - adhere to prescribed diet: heart healthy - develop an exercise routine Start checking blood sugar as ordered by your doctor   Follow Up Plan: Telephone follow up appointment with care management team member scheduled for:  02/28/21 at 215 pm       CCM (HYPERTENSION) EXPECTED OUTCOME: MONITOR, SELF-MANAGE AND REDUCE SYMPTOMS OF HYPERTENSION       Current Barriers:  Knowledge Deficits related to management of Hypertension Chronic Disease Management support and education needs related to Hypertension and diet No Advanced Directives in place- pt declines Patient reports he lives alone, has someone he can call on if needed for assistance, pt reports he does not check blood pressure and feels having checked at provider appointments is sufficient.  Pt reports he does not exercise, does not follow a special diet.  Planned Interventions: Evaluation of current treatment plan related to hypertension self management and patient's adherence to plan as established by provider;   Reviewed prescribed diet low sodium Reviewed medications with patient and discussed importance of compliance;  Counseled on the importance of exercise goals with target of 150 minutes per  week Discussed plans with patient for ongoing care management follow up and provided patient  with direct contact information for care management team; Advised patient, providing education and rationale, to monitor blood pressure daily and record, calling PCP for findings outside established parameters;  Provided education on prescribed diet low sodium;  Discussed complications of poorly controlled blood pressure such as heart disease, stroke, circulatory complications, vision complications, kidney impairment, sexual dysfunction;  Screening for signs and symptoms of depression related to chronic disease state;  Assessed social determinant of health barriers;   Symptom Management: Take medications as prescribed   Attend all scheduled provider appointments Call pharmacy for medication refills 3-7 days in advance of running out of medications Perform all self care activities independently  Perform IADL's (shopping, preparing meals, housekeeping, managing finances) independently Call provider office for new concerns or questions  check blood pressure weekly choose a place to take my blood pressure (home, clinic or office, retail store) write blood pressure results in a log or diary keep a blood pressure log take blood pressure log to all doctor appointments keep all doctor appointments take medications for blood pressure exactly as prescribed begin an exercise program report new symptoms to your doctor eat more whole grains, fruits and vegetables, lean meats and healthy fats Consider starting to check blood pressure at home and keeping a log  Follow Up Plan: Telephone follow up appointment with care management team member scheduled for:  02/28/21 at 215 pm          The patient verbalized understanding of instructions, educational materials, and care plan provided today and DECLINED offer to receive copy of patient instructions, educational materials, and care plan.   Telephone follow up appointment with care management team member scheduled for:  02/28/21 at 215  pm  Heart-Healthy Eating Plan Many factors influence your heart health, including eating and exercise habits. Heart health is also called coronary health. Coronary risk increases with abnormal blood fat (lipid) levels. A heart-healthy eating plan includes limiting unhealthy fats, increasing healthy fats, limiting salt (sodium) intake, and making other diet and lifestyle changes. What is my plan? Your health care provider may recommend that: You limit your fat intake to _________% or less of your total calories each day. You limit your saturated fat intake to _________% or less of your total calories each day. You limit the amount of cholesterol in your diet to less than _________ mg per day. You limit the amount of sodium in your diet to less than _________ mg per day. What are tips for following this plan? Cooking Cook foods using methods other than frying. Baking, boiling, grilling, and broiling are all good options. Other ways to reduce fat include: Removing the skin from poultry. Removing all visible fats from meats. Steaming vegetables in water or broth. Meal planning  At meals, imagine dividing your plate into fourths: Fill one-half of your plate with vegetables and green salads. Fill one-fourth of your plate with whole grains. Fill one-fourth of your plate with lean protein foods. Eat 2-4 cups of vegetables per day. One cup of vegetables equals 1 cup (91 g) broccoli or cauliflower florets, 2 medium carrots, 1 large bell pepper, 1 large sweet potato, 1 large tomato, 1 medium white potato, 2 cups (150 g) raw leafy greens. Eat 1-2 cups of fruit per day. One cup of fruit equals 1 small apple, 1 large banana, 1 cup (237 g) mixed fruit, 1 large orange,  cup (82 g) dried fruit, 1 cup (240  mL) 100% fruit juice. Eat more foods that contain soluble fiber. Examples include apples, broccoli, carrots, beans, peas, and barley. Aim to get 25-30 g of fiber per day. Increase your consumption of  legumes, nuts, and seeds to 4-5 servings per week. One serving of dried beans or legumes equals  cup (90 g) cooked, 1 serving of nuts is  oz (12 almonds, 24 pistachios, or 7 walnut halves), and 1 serving of seeds equals  oz (8 g). Fats Choose healthy fats more often. Choose monounsaturated and polyunsaturated fats, such as olive and canola oils, avocado oil, flaxseeds, walnuts, almonds, and seeds. Eat more omega-3 fats. Choose salmon, mackerel, sardines, tuna, flaxseed oil, and ground flaxseeds. Aim to eat fish at least 2 times each week. Check food labels carefully to identify foods with trans fats or high amounts of saturated fat. Limit saturated fats. These are found in animal products, such as meats, butter, and cream. Plant sources of saturated fats include palm oil, palm kernel oil, and coconut oil. Avoid foods with partially hydrogenated oils in them. These contain trans fats. Examples are stick margarine, some tub margarines, cookies, crackers, and other baked goods. Avoid fried foods. General information Eat more home-cooked food and less restaurant, buffet, and fast food. Limit or avoid alcohol. Limit foods that are high in added sugar and simple starches such as foods made using white refined flour (white breads, pastries, sweets). Lose weight if you are overweight. Losing just 5-10% of your body weight can help your overall health and prevent diseases such as diabetes and heart disease. Monitor your sodium intake, especially if you have high blood pressure. Talk with your health care provider about your sodium intake. Try to incorporate more vegetarian meals weekly. What foods should I eat? Fruits All fresh, canned (in natural juice), or frozen fruits. Vegetables Fresh or frozen vegetables (raw, steamed, roasted, or grilled). Green salads. Grains Most grains. Choose whole wheat and whole grains most of the time. Rice and pasta, including brown rice and pastas made with whole  wheat. Meats and other proteins Lean, well-trimmed beef, veal, pork, and lamb. Chicken and [place] without skin. All fish and shellfish. Wild duck, rabbit, pheasant, and venison. Egg whites or low-cholesterol egg substitutes. Dried beans, peas, lentils, and tofu. Seeds and most nuts. Dairy Low-fat or nonfat cheeses, including ricotta and mozzarella. Skim or 1% milk (liquid, powdered, or evaporated). Buttermilk made with low-fat milk. Nonfat or low-fat yogurt. Fats and oils Non-hydrogenated (trans-free) margarines. Vegetable oils, including soybean, sesame, sunflower, olive, avocado, peanut, safflower, corn, canola, and cottonseed. Salad dressings or mayonnaise made with a vegetable oil. Beverages Water (mineral or sparkling). Coffee and tea. Unsweetened ice tea. Diet beverages. Sweets and desserts Sherbet, gelatin, and fruit ice. Small amounts of dark chocolate. Limit all sweets and desserts. Seasonings and condiments All seasonings and condiments. The items listed above may not be a complete list of foods and beverages you can eat. Contact a dietitian for more options. What foods should I avoid? Fruits Canned fruit in heavy syrup. Fruit in cream or butter sauce. Fried fruit. Limit coconut. Vegetables Vegetables cooked in cheese, cream, or butter sauce. Fried vegetables. Grains Breads made with saturated or trans fats, oils, or whole milk. Croissants. Sweet rolls. Donuts. High-fat crackers, such as cheese crackers and chips. Meats and other proteins Fatty meats, such as hot dogs, ribs, sausage, bacon, rib-eye roast or steak. High-fat deli meats, such as salami and bologna. Caviar. Domestic duck and goose. Organ meats, such as liver. Dairy  Cream, sour cream, cream cheese, and creamed cottage cheese. Whole-milk cheeses. Whole or 2% milk (liquid, evaporated, or condensed). Whole buttermilk. Cream sauce or high-fat cheese sauce. Whole-milk yogurt. Fats and oils Meat fat, or shortening. Cocoa  butter, hydrogenated oils, palm oil, coconut oil, palm kernel oil. Solid fats and shortenings, including bacon fat, salt pork, lard, and butter. Nondairy cream substitutes. Salad dressings with cheese or sour cream. Beverages Regular sodas and any drinks with added sugar. Sweets and desserts Frosting. Pudding. Cookies. Cakes. Pies. Milk chocolate or white chocolate. Buttered syrups. Full-fat ice cream or ice cream drinks. The items listed above may not be a complete list of foods and beverages to avoid. Contact a dietitian for more information. Summary Heart-healthy meal planning includes limiting unhealthy fats, increasing healthy fats, limiting salt (sodium) intake and making other diet and lifestyle changes. Lose weight if you are overweight. Losing just 5-10% of your body weight can help your overall health and prevent diseases such as diabetes and heart disease. Focus on eating a balance of foods, including fruits and vegetables, low-fat or nonfat dairy, lean protein, nuts and legumes, whole grains, and heart-healthy oils and fats. This information is not intended to replace advice given to you by your health care provider. Make sure you discuss any questions you have with your health care provider. Document Revised: 02/27/2021 Document Reviewed: 02/27/2021 Elsevier Patient Education  2023 [employer] Low-Sodium Eating Plan Sodium, which is an element that makes up salt, helps you maintain a healthy balance of fluids in your body. Too much sodium can increase your blood pressure and cause fluid and waste to be held in your body. Your health care provider or dietitian may recommend following this plan if you have high blood pressure (hypertension), kidney disease, liver disease, or heart failure. Eating less sodium can help lower your blood pressure, reduce swelling, and protect your heart, liver, and kidneys. What are tips for following this plan? Reading food labels The Nutrition Facts  label lists the amount of sodium in one serving of the food. If you eat more than one serving, you must multiply the listed amount of sodium by the number of servings. Choose foods with less than 140 mg of sodium per serving. Avoid foods with 300 mg of sodium or more per serving. Shopping  Look for lower-sodium products, often labeled as "low-sodium" or "no salt added." Always check the sodium content, even if foods are labeled as "unsalted" or "no salt added." Buy fresh foods. Avoid canned foods and pre-made or frozen meals. Avoid canned, cured, or processed meats. Buy breads that have less than 80 mg of sodium per slice. Cooking  Eat more home-cooked food and less restaurant, buffet, and fast food. Avoid adding salt when cooking. Use salt-free seasonings or herbs instead of table salt or sea salt. Check with your health care provider or pharmacist before using salt substitutes. Cook with plant-based oils, such as canola, sunflower, or olive oil. Meal planning When eating at a restaurant, ask that your food be prepared with less salt or no salt, if possible. Avoid dishes labeled as brined, pickled, cured, smoked, or made with soy sauce, miso, or teriyaki sauce. Avoid foods that contain MSG (monosodium glutamate). MSG is sometimes added to [place] food, bouillon, and some canned foods. Make meals that can be grilled, baked, poached, roasted, or steamed. These are generally made with less sodium. General information Most people on this plan should limit their sodium intake to 1,500-2,000 mg (milligrams) of sodium each  day. What foods should I eat? Fruits Fresh, frozen, or canned fruit. Fruit juice. Vegetables Fresh or frozen vegetables. "No salt added" canned vegetables. "No salt added" tomato sauce and paste. Low-sodium or reduced-sodium tomato and vegetable juice. Grains Low-sodium cereals, including oats, puffed wheat and rice, and shredded wheat. Low-sodium crackers. Unsalted rice.  Unsalted pasta. Low-sodium bread. Whole-grain breads and whole-grain pasta. Meats and other proteins Fresh or frozen (no salt added) meat, poultry, seafood, and fish. Low-sodium canned tuna and salmon. Unsalted nuts. Dried peas, beans, and lentils without added salt. Unsalted canned beans. Eggs. Unsalted nut butters. Dairy Milk. Soy milk. Cheese that is naturally low in sodium, such as ricotta cheese, fresh mozzarella, or Swiss cheese. Low-sodium or reduced-sodium cheese. Cream cheese. Yogurt. Seasonings and condiments Fresh and dried herbs and spices. Salt-free seasonings. Low-sodium mustard and ketchup. Sodium-free salad dressing. Sodium-free light mayonnaise. Fresh or refrigerated horseradish. Lemon juice. Vinegar. Other foods Homemade, reduced-sodium, or low-sodium soups. Unsalted popcorn and pretzels. Low-salt or salt-free chips. The items listed above may not be a complete list of foods and beverages you can eat. Contact a dietitian for more information. What foods should I avoid? Vegetables Sauerkraut, pickled vegetables, and relishes. Olives. [place] fries. Onion rings. Regular canned vegetables (not low-sodium or reduced-sodium). Regular canned tomato sauce and paste (not low-sodium or reduced-sodium). Regular tomato and vegetable juice (not low-sodium or reduced-sodium). Frozen vegetables in sauces. Grains Instant hot cereals. Bread stuffing, pancake, and biscuit mixes. Croutons. Seasoned rice or pasta mixes. Noodle soup cups. Boxed or frozen macaroni and cheese. Regular salted crackers. Self-rising flour. Meats and other proteins Meat or fish that is salted, canned, smoked, spiced, or pickled. Precooked or cured meat, such as sausages or meat loaves. Tomasa Blase. Ham. Pepperoni. Hot dogs. Corned beef. Chipped beef. Salt pork. Jerky. Pickled herring. Anchovies and sardines. Regular canned tuna. Salted nuts. Dairy Processed cheese and cheese spreads. Hard cheeses. Cheese curds. Blue cheese. Feta  cheese. String cheese. Regular cottage cheese. Buttermilk. Canned milk. Fats and oils Salted butter. Regular margarine. Ghee. Bacon fat. Seasonings and condiments Onion salt, garlic salt, seasoned salt, table salt, and sea salt. Canned and packaged gravies. Worcestershire sauce. Tartar sauce. Barbecue sauce. Teriyaki sauce. Soy sauce, including reduced-sodium. Steak sauce. Fish sauce. Oyster sauce. Cocktail sauce. Horseradish that you find on the shelf. Regular ketchup and mustard. Meat flavorings and tenderizers. Bouillon cubes. Hot sauce. Pre-made or packaged marinades. Pre-made or packaged taco seasonings. Relishes. Regular salad dressings. Salsa. Other foods Salted popcorn and pretzels. Corn chips and puffs. Potato and tortilla chips. Canned or dried soups. Pizza. Frozen entrees and pot pies. The items listed above may not be a complete list of foods and beverages you should avoid. Contact a dietitian for more information. Summary Eating less sodium can help lower your blood pressure, reduce swelling, and protect your heart, liver, and kidneys. Most people on this plan should limit their sodium intake to 1,500-2,000 mg (milligrams) of sodium each day. Canned, boxed, and frozen foods are high in sodium. Restaurant foods, fast foods, and pizza are also very high in sodium. You also get sodium by adding salt to food. Try to cook at home, eat more fresh fruits and vegetables, and eat less fast food and canned, processed, or prepared foods. This information is not intended to replace advice given to you by your health care provider. Make sure you discuss any questions you have with your health care provider. Document Revised: 02/27/2019 Document Reviewed: 12/24/2018 Elsevier Patient Education  2023 [employer].

## 2021-12-20 NOTE — Chronic Care Management (AMB) (Signed)
Chronic Care Management   CCM RN Visit Note  12/20/2021 Name: YAKIR WENKE MRN: [id number] DOB: 12-31-1950  Subjective: Charles Beltran is a 71 y.o. year old male who is a primary care patient of Junie Spencer, FNP. The patient was referred to the Chronic Care Management team for assistance with care management needs subsequent to provider initiation of CCM services and plan of care.    Today's Visit:  Engaged with patient by telephone for initial visit.     SDOH Interventions Today    Flowsheet Row Most Recent Value  SDOH Interventions   Food Insecurity Interventions Intervention Not Indicated  Housing Interventions Intervention Not Indicated  Transportation Interventions Intervention Not Indicated  Utilities Interventions Intervention Not Indicated  Financial Strain Interventions Intervention Not Indicated  Physical Activity Interventions Patient Refused  Stress Interventions Intervention Not Indicated  Social Connections Interventions Patient Refused         Goals Addressed             This Visit's Progress    CCM (HYPERLIPIDEMIA) EXPECTED OUTCOME: MONITOR, SELF-MANAGE AND REDUCE SYMPTOMS OF HYPERLIPIDEMIA       Current Barriers:  Knowledge Deficits related to management of Hyperlipidemia Chronic Disease Management support and education needs related to Hyperlipidemia and diet No Advanced Directives in place- pt declines information Patient reports he is independent with all aspects of his care, continues to drive, lives alone, does not follow a special diet, does not exercise. Patient has diagnosis Pre-diabetes, is on jardiance, has glucometer but has not checked CBG, pt is contemplating and states he may decide to start checking, reports "pharmacist helped me and I know how"  Planned Interventions: Provider established cholesterol goals reviewed; Counseled on importance of regular laboratory monitoring as prescribed; Provided HLD educational materials; Reviewed  role and benefits of statin for ASCVD risk reduction; Reviewed importance of limiting foods high in cholesterol; Reviewed exercise goals and target of 150 minutes per week; Screening for signs and symptoms of depression related to chronic disease state;  Assessed social determinant of health barriers;  Education provided heart healthy diet  Symptom Management: Take medications as prescribed   Attend all scheduled provider appointments Call pharmacy for medication refills 3-7 days in advance of running out of medications Perform all self care activities independently  Perform IADL's (shopping, preparing meals, housekeeping, managing finances) independently Call provider office for new concerns or questions  - call for medicine refill 2 or 3 days before it runs out - take all medications exactly as prescribed - call doctor with any symptoms you believe are related to your medicine - call doctor when you experience any new symptoms - go to all doctor appointments as scheduled - adhere to prescribed diet: heart healthy - develop an exercise routine Start checking blood sugar as ordered by your doctor   Follow Up Plan: Telephone follow up appointment with care management team member scheduled for:  02/28/21 at 215 pm       CCM (HYPERTENSION) EXPECTED OUTCOME: MONITOR, SELF-MANAGE AND REDUCE SYMPTOMS OF HYPERTENSION       Current Barriers:  Knowledge Deficits related to management of Hypertension Chronic Disease Management support and education needs related to Hypertension and diet No Advanced Directives in place- pt declines Patient reports he lives alone, has someone he can call on if needed for assistance, pt reports he does not check blood pressure and feels having checked at provider appointments is sufficient.  Pt reports he does not exercise, does not follow a  special diet.  Planned Interventions: Evaluation of current treatment plan related to hypertension self management and  patient's adherence to plan as established by provider;   Reviewed prescribed diet low sodium Reviewed medications with patient and discussed importance of compliance;  Counseled on the importance of exercise goals with target of 150 minutes per week Discussed plans with patient for ongoing care management follow up and provided patient with direct contact information for care management team; Advised patient, providing education and rationale, to monitor blood pressure daily and record, calling PCP for findings outside established parameters;  Provided education on prescribed diet low sodium;  Discussed complications of poorly controlled blood pressure such as heart disease, stroke, circulatory complications, vision complications, kidney impairment, sexual dysfunction;  Screening for signs and symptoms of depression related to chronic disease state;  Assessed social determinant of health barriers;   Symptom Management: Take medications as prescribed   Attend all scheduled provider appointments Call pharmacy for medication refills 3-7 days in advance of running out of medications Perform all self care activities independently  Perform IADL's (shopping, preparing meals, housekeeping, managing finances) independently Call provider office for new concerns or questions  check blood pressure weekly choose a place to take my blood pressure (home, clinic or office, retail store) write blood pressure results in a log or diary keep a blood pressure log take blood pressure log to all doctor appointments keep all doctor appointments take medications for blood pressure exactly as prescribed begin an exercise program report new symptoms to your doctor eat more whole grains, fruits and vegetables, lean meats and healthy fats Consider starting to check blood pressure at home and keeping a log  Follow Up Plan: Telephone follow up appointment with care management team member scheduled for:  02/28/21 at  215 pm          Plan:Telephone follow up appointment with care management team member scheduled for:  02/28/21 at 215 pm  Irving Shows [facility], BSN RN Case Manager [facility] [phone]

## 2021-12-26 ENCOUNTER — Telehealth: Payer: PPO

## 2022-01-04 DIAGNOSIS — E785 Hyperlipidemia, unspecified: Secondary | ICD-10-CM

## 2022-01-04 DIAGNOSIS — I10 Essential (primary) hypertension: Secondary | ICD-10-CM

## 2022-02-28 ENCOUNTER — Ambulatory Visit (INDEPENDENT_AMBULATORY_CARE_PROVIDER_SITE_OTHER): Payer: PPO | Admitting: *Deleted

## 2022-02-28 DIAGNOSIS — I10 Essential (primary) hypertension: Secondary | ICD-10-CM

## 2022-02-28 DIAGNOSIS — E785 Hyperlipidemia, unspecified: Secondary | ICD-10-CM

## 2022-02-28 NOTE — Chronic Care Management (AMB) (Signed)
Chronic Care Management   CCM RN Visit Note  02/28/2022 Name: Charles Beltran MRN: [id number] DOB: 08-03-1950  Subjective: Charles Beltran is a 72 y.o. year old male who is a primary care patient of Sharion Balloon, FNP. The patient was referred to the Chronic Care Management team for assistance with care management needs subsequent to provider initiation of CCM services and plan of care.    Today's Visit:  Engaged with patient by telephone for follow up visit.        Goals Addressed             This Visit's Progress    CCM (HYPERLIPIDEMIA) EXPECTED OUTCOME: MONITOR, SELF-MANAGE AND REDUCE SYMPTOMS OF HYPERLIPIDEMIA       Current Barriers:  Knowledge Deficits related to management of Hyperlipidemia Chronic Disease Management support and education needs related to Hyperlipidemia and diet No Advanced Directives in place- pt declines information Patient reports he is independent with all aspects of his care, continues to drive, lives alone, does not follow a special diet, does not exercise. Patient has diagnosis Pre-diabetes, is on jardiance, has glucometer but has not checked CBG, pt is contemplating and states he may decide to start checking, reports "pharmacist helped me and I know how",  patient reports today he still is not checking CBG, states he will talk with primary care provider about this at his March appointment.   Planned Interventions: Provider established cholesterol goals reviewed; Counseled on importance of regular laboratory monitoring as prescribed; Reviewed role and benefits of statin for ASCVD risk reduction; Reviewed importance of limiting foods high in cholesterol; Reviewed exercise goals and target of 150 minutes per week; Reviewed heart healthy diet  Symptom Management: Take medications as prescribed   Attend all scheduled provider appointments Call pharmacy for medication refills 3-7 days in advance of running out of medications Perform all self care  activities independently  Perform IADL's (shopping, preparing meals, housekeeping, managing finances) independently Call provider office for new concerns or questions  - call for medicine refill 2 or 3 days before it runs out - take all medications exactly as prescribed - call doctor with any symptoms you believe are related to your medicine - call doctor when you experience any new symptoms - go to all doctor appointments as scheduled - adhere to prescribed diet: heart healthy - develop an exercise routine Start checking blood sugar as ordered by your doctor Talk to primary care provider about checking blood sugar at your March appointment Read food labels for sodium content, avoid/ limit fast food   Follow Up Plan: Telephone follow up appointment with care management team member scheduled for:  05/10/22 at 215 pm       CCM (HYPERTENSION) EXPECTED OUTCOME: MONITOR, SELF-MANAGE AND REDUCE SYMPTOMS OF HYPERTENSION       Current Barriers:  Knowledge Deficits related to management of Hypertension Chronic Disease Management support and education needs related to Hypertension and diet No Advanced Directives in place- pt declines Patient reports he lives alone, has someone he can call on if needed for assistance, pt reports he does not check blood pressure and feels having checked at provider appointments is sufficient.  Pt reports he does not exercise, does not follow a special diet. Patient states overall he is doing well, states he has all medications and taking as prescribed.  Planned Interventions: Evaluation of current treatment plan related to hypertension self management and patient's adherence to plan as established by provider;   Reviewed prescribed diet low  sodium Reviewed medications with patient and discussed importance of compliance;  Counseled on the importance of exercise goals with target of 150 minutes per week Discussed plans with patient for ongoing care management follow up  and provided patient with direct contact information for care management team; Advised patient, providing education and rationale, to monitor blood pressure daily and record, calling PCP for findings outside established parameters;  Provided education on prescribed diet low sodium;  Discussed complications of poorly controlled blood pressure such as heart disease, stroke, circulatory complications, vision complications, kidney impairment, sexual dysfunction;   Symptom Management: Take medications as prescribed   Attend all scheduled provider appointments Call pharmacy for medication refills 3-7 days in advance of running out of medications Perform all self care activities independently  Perform IADL's (shopping, preparing meals, housekeeping, managing finances) independently Call provider office for new concerns or questions  check blood pressure weekly choose a place to take my blood pressure (home, clinic or office, retail store) write blood pressure results in a log or diary keep a blood pressure log take blood pressure log to all doctor appointments keep all doctor appointments take medications for blood pressure exactly as prescribed begin an exercise program report new symptoms to your doctor eat more whole grains, fruits and vegetables, lean meats and healthy fats Consider starting to check blood pressure at home and keeping a log Follow  low sodium diet- read labels  Follow Up Plan: Telephone follow up appointment with care management team member scheduled for:  05/10/22 at 215 pm          Plan:Telephone follow up appointment with care management team member scheduled for:  05/10/22 at 215 pm  Jacqlyn Larsen [facility], BSN RN Case Manager [place] [phone]

## 2022-02-28 NOTE — Patient Instructions (Signed)
Please call the care guide team at [phone] if you need to cancel or reschedule your appointment.   If you are experiencing a Mental Health or [place] or need someone to talk to, please call the Suicide and Crisis Lifeline: 988 call the [place] National Suicide Prevention Lifeline: 417-485-8630 or TTY: 505-402-1246 TTY 747-045-3576) to talk to a trained counselor call 1-800-273-TALK (toll free, 24 hour hotline) go to [facility] [address] [place] [phone]) call the [facility]: [phone] call 911   Following is a copy of the CCM Program Consent:  CCM service includes personalized support from designated clinical staff supervised by the physician, including individualized plan of care and coordination with other care providers 24/7 contact phone numbers for assistance for urgent and routine care needs. Service will only be billed when office clinical staff spend 20 minutes or more in a month to coordinate care. Only one practitioner may furnish and bill the service in a calendar month. The patient may stop CCM services at amy time (effective at the end of the month) by phone call to the office staff. The patient will be responsible for cost sharing (co-pay) or up to 20% of the service fee (after annual deductible is met)  Following is a copy of your full provider care plan:   Goals Addressed             This Visit's Progress    CCM (HYPERLIPIDEMIA) EXPECTED OUTCOME: MONITOR, SELF-MANAGE AND REDUCE SYMPTOMS OF HYPERLIPIDEMIA       Current Barriers:  Knowledge Deficits related to management of Hyperlipidemia Chronic Disease Management support and education needs related to Hyperlipidemia and diet No Advanced Directives in place- pt declines information Patient reports he is independent with all aspects of his care, continues to drive, lives alone, does not follow a special diet, does not  exercise. Patient has diagnosis Pre-diabetes, is on jardiance, has glucometer but has not checked CBG, pt is contemplating and states he may decide to start checking, reports "pharmacist helped me and I know how",  patient reports today he still is not checking CBG, states he will talk with primary care provider about this at his March appointment.   Planned Interventions: Provider established cholesterol goals reviewed; Counseled on importance of regular laboratory monitoring as prescribed; Reviewed role and benefits of statin for ASCVD risk reduction; Reviewed importance of limiting foods high in cholesterol; Reviewed exercise goals and target of 150 minutes per week; Reviewed heart healthy diet  Symptom Management: Take medications as prescribed   Attend all scheduled provider appointments Call pharmacy for medication refills 3-7 days in advance of running out of medications Perform all self care activities independently  Perform IADL's (shopping, preparing meals, housekeeping, managing finances) independently Call provider office for new concerns or questions  - call for medicine refill 2 or 3 days before it runs out - take all medications exactly as prescribed - call doctor with any symptoms you believe are related to your medicine - call doctor when you experience any new symptoms - go to all doctor appointments as scheduled - adhere to prescribed diet: heart healthy - develop an exercise routine Start checking blood sugar as ordered by your doctor Talk to primary care provider about checking blood sugar at your March appointment Read food labels for sodium content, avoid/ limit fast food   Follow Up Plan: Telephone follow up appointment with care management team member scheduled for:  05/10/22 at 215 pm  CCM (HYPERTENSION) EXPECTED OUTCOME: MONITOR, SELF-MANAGE AND REDUCE SYMPTOMS OF HYPERTENSION       Current Barriers:  Knowledge Deficits related to management of  Hypertension Chronic Disease Management support and education needs related to Hypertension and diet No Advanced Directives in place- pt declines Patient reports he lives alone, has someone he can call on if needed for assistance, pt reports he does not check blood pressure and feels having checked at provider appointments is sufficient.  Pt reports he does not exercise, does not follow a special diet. Patient states overall he is doing well, states he has all medications and taking as prescribed.  Planned Interventions: Evaluation of current treatment plan related to hypertension self management and patient's adherence to plan as established by provider;   Reviewed prescribed diet low sodium Reviewed medications with patient and discussed importance of compliance;  Counseled on the importance of exercise goals with target of 150 minutes per week Discussed plans with patient for ongoing care management follow up and provided patient with direct contact information for care management team; Advised patient, providing education and rationale, to monitor blood pressure daily and record, calling PCP for findings outside established parameters;  Provided education on prescribed diet low sodium;  Discussed complications of poorly controlled blood pressure such as heart disease, stroke, circulatory complications, vision complications, kidney impairment, sexual dysfunction;   Symptom Management: Take medications as prescribed   Attend all scheduled provider appointments Call pharmacy for medication refills 3-7 days in advance of running out of medications Perform all self care activities independently  Perform IADL's (shopping, preparing meals, housekeeping, managing finances) independently Call provider office for new concerns or questions  check blood pressure weekly choose a place to take my blood pressure (home, clinic or office, retail store) write blood pressure results in a log or diary keep  a blood pressure log take blood pressure log to all doctor appointments keep all doctor appointments take medications for blood pressure exactly as prescribed begin an exercise program report new symptoms to your doctor eat more whole grains, fruits and vegetables, lean meats and healthy fats Consider starting to check blood pressure at home and keeping a log Follow  low sodium diet- read labels  Follow Up Plan: Telephone follow up appointment with care management team member scheduled for:  05/10/22 at 215 pm          The patient verbalized understanding of instructions, educational materials, and care plan provided today and DECLINED offer to receive copy of patient instructions, educational materials, and care plan.   Telephone follow up appointment with care management team member scheduled for:  05/10/22 at 215 pm

## 2022-03-07 DIAGNOSIS — E785 Hyperlipidemia, unspecified: Secondary | ICD-10-CM | POA: Diagnosis not present

## 2022-03-07 DIAGNOSIS — I10 Essential (primary) hypertension: Secondary | ICD-10-CM

## 2022-03-14 ENCOUNTER — Other Ambulatory Visit: Payer: Self-pay | Admitting: Family

## 2022-03-23 NOTE — Plan of Care (Signed)
Chronic Care Management Provider Comprehensive Care Plan    03/23/2022 Name: INTI KROLL MRN: [id number] DOB: [birth year]  Referral to Chronic Care Management (CCM) services was placed by Provider:  Darrick Penna  on Date: 11/30/21.  Chronic Condition 1: HYPERTENSION Provider Assessment and Plan Primary hypertension - hydrochlorothiazide (MICROZIDE) 12.5 MG capsule; TAKE 1 CAPSULE BY MOUTH EVERY DAY  Dispense: 90 capsule; Refill: 2 - CMP14+EGFR - CBC with Differential/Platelet   Expected Outcome/Goals Addressed This Visit (Provider CCM goals/Provider Assessment and plan  CCM (HYPERTENSION) EXPECTED OUTCOME: MONITOR, SELF-MANAGE AND REDUCE SYMPTOMS OF HYPERTENSION  Symptom Management Condition 1: Take medications as prescribed   Attend all scheduled provider appointments Call pharmacy for medication refills 3-7 days in advance of running out of medications Perform all self care activities independently  Perform IADL's (shopping, preparing meals, housekeeping, managing finances) independently Call provider office for new concerns or questions  check blood pressure weekly choose a place to take my blood pressure (home, clinic or office, retail store) write blood pressure results in a log or diary keep a blood pressure log take blood pressure log to all doctor appointments keep all doctor appointments take medications for blood pressure exactly as prescribed begin an exercise program report new symptoms to your doctor eat more whole grains, fruits and vegetables, lean meats and healthy fats Consider starting to check blood pressure at home and keeping a log  Chronic Condition 2: HYPERLIPIDEMIA Provider Assessment and Plan Hyperlipidemia, unspecified hyperlipidemia type - atorvastatin (LIPITOR) 40 MG tablet; Take 1 tablet (40 mg total) by mouth daily.  Dispense: 90 tablet; Refill: 2 - CMP14+EGFR - CBC with Differential/Platelet   Expected Outcome/Goals Addressed This Visit  (Provider CCM goals/Provider Assessment and plan  CCM (HYPERLIPIDEMIA) EXPECTED OUTCOME: MONITOR, SELF-MANAGE AND REDUCE SYMPTOMS OF HYPERLIPIDEMIA  Symptom Management Condition 2: Take medications as prescribed   Attend all scheduled provider appointments Call pharmacy for medication refills 3-7 days in advance of running out of medications Perform all self care activities independently  Perform IADL's (shopping, preparing meals, housekeeping, managing finances) independently Call provider office for new concerns or questions  - call for medicine refill 2 or 3 days before it runs out - take all medications exactly as prescribed - call doctor with any symptoms you believe are related to your medicine - call doctor when you experience any new symptoms - go to all doctor appointments as scheduled - adhere to prescribed diet: heart healthy - develop an exercise routine Start checking blood sugar as ordered by your doctor  Problem List Patient Active Problem List   Diagnosis Date Noted   Diabetes mellitus without complication ([place]) 99991111   GERD (gastroesophageal reflux disease) 07/26/2017   Overweight (BMI 25.0-29.9) 07/22/2015   Vitamin D deficiency 10/05/2014   Heel spur 03/08/2014   HTN (hypertension) 05/01/2010   Hyperlipidemia 05/01/2010    Medication Management  Current Outpatient Medications:    amLODipine (NORVASC) 10 MG tablet, Take 1 tablet (10 mg total) by mouth daily., Disp: 90 tablet, Rfl: 2   atorvastatin (LIPITOR) 40 MG tablet, Take 1 tablet (40 mg total) by mouth daily., Disp: 90 tablet, Rfl: 2   Cholecalciferol (VITAMIN D) 2000 UNITS tablet, Take 2,000 Units by mouth daily., Disp: , Rfl:    empagliflozin (JARDIANCE) 10 MG TABS tablet, Take 1 tablet (10 mg total) by mouth daily before breakfast., Disp: 90 tablet, Rfl: 1   fish oil-omega-3 fatty acids 1000 MG capsule, Take 1 g by mouth. One bid, Disp: , Rfl:  hydrochlorothiazide (MICROZIDE) 12.5 MG capsule,  TAKE 1 CAPSULE BY MOUTH EVERY DAY, Disp: 90 capsule, Rfl: 2   omeprazole (PRILOSEC) 40 MG capsule, Take 1 capsule (40 mg total) by mouth daily., Disp: 90 capsule, Rfl: 2   Blood Glucose Monitoring Suppl (ONETOUCH VERIO FLEX SYSTEM) w/Device KIT, Use to test blood sugar daily as directed; DX:E11.9, Disp: 1 kit, Rfl: 1   glucose blood (ONETOUCH VERIO) test strip, Use as instructed to test blood sugar daily. DX: E11.9 (Patient not taking: Reported on 12/20/2021), Disp: 50 each, Rfl: 12   lisinopril (ZESTRIL) 10 MG tablet, TAKE 1 TABLET BY MOUTH EVERY DAY, Disp: 90 tablet, Rfl: 0   OneTouch Delica Lancets 99991111 MISC, Use to test blood sugar daily as directed; DX:E11.9 (Patient not taking: Reported on 12/20/2021), Disp: 100 each, Rfl: 11  Cognitive Assessment Identity Confirmed: : Name; DOB Cognitive Status: Normal   Functional Assessment Hearing Difficulty or Deaf: no Wear Glasses or Blind: yes Vision Management: reading glasses Concentrating, Remembering or Making Decisions Difficulty (CP): no Difficulty Communicating: no Difficulty Eating/Swallowing: no Walking or Climbing Stairs Difficulty: no Dressing/Bathing Difficulty: no Doing Errands Independently Difficulty (such as shopping) (CP): no   Caregiver Assessment  Primary Source of Support/Comfort: -- (pt states no one particular person) Name of Support/Comfort Primary Source: pt lives alone and states he has someone he can call of if needed People in Home: alone Name(s) of People in Home: lives alone   Planned Interventions  Evaluation of current treatment plan related to hypertension self management and patient's adherence to plan as established by provider;   Reviewed prescribed diet low sodium Reviewed medications with patient and discussed importance of compliance;  Counseled on the importance of exercise goals with target of 150 minutes per week Discussed plans with patient for ongoing care management follow up and provided  patient with direct contact information for care management team; Advised patient, providing education and rationale, to monitor blood pressure daily and record, calling PCP for findings outside established parameters;  Provided education on prescribed diet low sodium;  Discussed complications of poorly controlled blood pressure such as heart disease, stroke, circulatory complications, vision complications, kidney impairment, sexual dysfunction;  Screening for signs and symptoms of depression related to chronic disease state;  Assessed social determinant of health barriers;  Provider established cholesterol goals reviewed; Counseled on importance of regular laboratory monitoring as prescribed; Provided HLD educational materials; Reviewed role and benefits of statin for ASCVD risk reduction; Reviewed importance of limiting foods high in cholesterol; Reviewed exercise goals and target of 150 minutes per week; Screening for signs and symptoms of depression related to chronic disease state;  Assessed social determinant of health barriers;  Education provided heart healthy diet  Interaction and coordination with outside resources, practitioners, and providers See CCM Referral  Care Plan: Printed and mailed to patient

## 2022-04-12 ENCOUNTER — Other Ambulatory Visit: Payer: Self-pay | Admitting: Family

## 2022-04-23 ENCOUNTER — Other Ambulatory Visit: Payer: PPO

## 2022-04-23 ENCOUNTER — Ambulatory Visit: Payer: PPO | Admitting: Family

## 2022-04-23 ENCOUNTER — Other Ambulatory Visit: Payer: Self-pay | Admitting: Family Medicine

## 2022-04-23 DIAGNOSIS — E119 Type 2 diabetes mellitus without complications: Secondary | ICD-10-CM | POA: Diagnosis not present

## 2022-04-23 DIAGNOSIS — K219 Gastro-esophageal reflux disease without esophagitis: Secondary | ICD-10-CM

## 2022-04-23 LAB — BAYER DCA HB A1C WAIVED: HB A1C (BAYER DCA - WAIVED): 6 % — ABNORMAL HIGH (ref 4.8–5.6)

## 2022-04-23 LAB — LIPID PANEL

## 2022-04-24 LAB — CBC WITH DIFFERENTIAL/PLATELET
Basophils Absolute: 0 10*3/uL (ref 0.0–0.2)
Basos: 0 %
EOS (ABSOLUTE): 0.1 10*3/uL (ref 0.0–0.4)
Eos: 1 %
Hematocrit: 48.3 % (ref 37.5–51.0)
Hemoglobin: 16.1 g/dL (ref 13.0–17.7)
Immature Grans (Abs): 0 10*3/uL (ref 0.0–0.1)
Immature Granulocytes: 0 %
Lymphocytes Absolute: 1 10*3/uL (ref 0.7–3.1)
Lymphs: 7 %
MCH: 30.4 pg (ref 26.6–33.0)
MCHC: 33.3 g/dL (ref 31.5–35.7)
MCV: 91 fL (ref 79–97)
Monocytes Absolute: 1.1 10*3/uL — ABNORMAL HIGH (ref 0.1–0.9)
Monocytes: 8 %
Neutrophils Absolute: 11.8 10*3/uL — ABNORMAL HIGH (ref 1.4–7.0)
Neutrophils: 84 %
Platelets: 180 10*3/uL (ref 150–450)
RBC: 5.3 x10E6/uL (ref 4.14–5.80)
RDW: 12.6 % (ref 11.6–15.4)
WBC: 14.1 10*3/uL — ABNORMAL HIGH (ref 3.4–10.8)

## 2022-04-24 LAB — LIPID PANEL
Chol/HDL Ratio: 2.4 ratio (ref 0.0–5.0)
Cholesterol, Total: 125 mg/dL (ref 100–199)
HDL: 53 mg/dL (ref >39)
LDL Chol Calc (NIH): 57 mg/dL (ref 0–99)
Triglycerides: 73 mg/dL (ref 0–149)
VLDL Cholesterol Cal: 15 mg/dL (ref 5–40)

## 2022-04-24 LAB — MICROALBUMIN / CREATININE URINE RATIO
Creatinine, Urine: 29.4 mg/dL
Microalb/Creat Ratio: 24 mg/g creat (ref 0–29)
Microalbumin, Urine: 7.2 ug/mL

## 2022-04-24 LAB — CMP14+EGFR
ALT: 26 IU/L (ref 0–44)
AST: 22 IU/L (ref 0–40)
Albumin/Globulin Ratio: 2 (ref 1.2–2.2)
Albumin: 4.7 g/dL (ref 3.8–4.8)
Alkaline Phosphatase: 80 IU/L (ref 44–121)
BUN/Creatinine Ratio: 21 (ref 10–24)
BUN: 20 mg/dL (ref 8–27)
Bilirubin Total: 0.6 mg/dL (ref 0.0–1.2)
CO2: 23 mmol/L (ref 20–29)
Calcium: 9.6 mg/dL (ref 8.6–10.2)
Chloride: 99 mmol/L (ref 96–106)
Creatinine, Ser: 0.96 mg/dL (ref 0.76–1.27)
Globulin, Total: 2.3 g/dL (ref 1.5–4.5)
Glucose: 119 mg/dL — ABNORMAL HIGH (ref 70–99)
Potassium: 4.3 mmol/L (ref 3.5–5.2)
Sodium: 139 mmol/L (ref 134–144)
Total Protein: 7 g/dL (ref 6.0–8.5)
eGFR: 85 mL/min/{1.73_m2} (ref >59)

## 2022-04-26 ENCOUNTER — Ambulatory Visit (INDEPENDENT_AMBULATORY_CARE_PROVIDER_SITE_OTHER): Payer: PPO

## 2022-04-26 ENCOUNTER — Ambulatory Visit (INDEPENDENT_AMBULATORY_CARE_PROVIDER_SITE_OTHER): Payer: PPO | Admitting: Family

## 2022-04-26 ENCOUNTER — Encounter: Payer: Self-pay | Admitting: Family

## 2022-04-26 ENCOUNTER — Telehealth: Payer: Self-pay | Admitting: Family

## 2022-04-26 VITALS — BP 129/73 | HR 63 | Temp 97.0°F | Ht 69.0 in | Wt 169.2 lb

## 2022-04-26 DIAGNOSIS — D72829 Elevated white blood cell count, unspecified: Secondary | ICD-10-CM

## 2022-04-26 DIAGNOSIS — Z23 Encounter for immunization: Secondary | ICD-10-CM

## 2022-04-26 DIAGNOSIS — E559 Vitamin D deficiency, unspecified: Secondary | ICD-10-CM

## 2022-04-26 DIAGNOSIS — E1169 Type 2 diabetes mellitus with other specified complication: Secondary | ICD-10-CM

## 2022-04-26 DIAGNOSIS — K219 Gastro-esophageal reflux disease without esophagitis: Secondary | ICD-10-CM | POA: Diagnosis not present

## 2022-04-26 DIAGNOSIS — B070 Plantar wart: Secondary | ICD-10-CM

## 2022-04-26 DIAGNOSIS — I10 Essential (primary) hypertension: Secondary | ICD-10-CM

## 2022-04-26 DIAGNOSIS — E663 Overweight: Secondary | ICD-10-CM

## 2022-04-26 DIAGNOSIS — Z6825 Body mass index (BMI) 25.0-25.9, adult: Secondary | ICD-10-CM | POA: Diagnosis not present

## 2022-04-26 DIAGNOSIS — E785 Hyperlipidemia, unspecified: Secondary | ICD-10-CM

## 2022-04-26 LAB — CBC WITH DIFFERENTIAL/PLATELET
Basophils Absolute: 0 10*3/uL (ref 0.0–0.2)
Basos: 0 %
EOS (ABSOLUTE): 0.1 10*3/uL (ref 0.0–0.4)
Eos: 1 %
Hematocrit: 47.4 % (ref 37.5–51.0)
Hemoglobin: 16.3 g/dL (ref 13.0–17.7)
Immature Grans (Abs): 0 10*3/uL (ref 0.0–0.1)
Immature Granulocytes: 0 %
Lymphocytes Absolute: 1 10*3/uL (ref 0.7–3.1)
Lymphs: 12 %
MCH: 30.8 pg (ref 26.6–33.0)
MCHC: 34.4 g/dL (ref 31.5–35.7)
MCV: 89 fL (ref 79–97)
Monocytes Absolute: 0.9 10*3/uL (ref 0.1–0.9)
Monocytes: 11 %
Neutrophils Absolute: 6.4 10*3/uL (ref 1.4–7.0)
Neutrophils: 76 %
Platelets: 169 10*3/uL (ref 150–450)
RBC: 5.3 x10E6/uL (ref 4.14–5.80)
RDW: 13.1 % (ref 11.6–15.4)
WBC: 8.5 10*3/uL (ref 3.4–10.8)

## 2022-04-26 MED ORDER — EMPAGLIFLOZIN 10 MG PO TABS: 10.0000 mg | ORAL_TABLET | Freq: Every day | ORAL | 1 refills | Status: DC

## 2022-04-26 NOTE — Patient Instructions (Signed)
Plantar Warts Warts are small growths on the skin. When they occur on the underside (sole) of the foot, they are called plantar warts. Plantar warts often occur in groups, with several small warts around a larger wart. They tend to develop on the heel or the ball of the foot. They may grow into the deeper layers of skin or rise above the surface of the skin. Most warts are not painful, and they usually do not cause problems. However, plantar warts may cause pain when you walk because pressure is applied to them. Plantar warts may spread to other areas of the sole. They can also spread to other areas of the body through direct and indirect contact. Warts often go away on their own in time. Various treatments may be done if needed or desired. What are the causes? Plantar warts are caused by a type of virus that is called human papillomavirus (HPV). Walking barefoot can cause exposure to the virus, especially if your feet are wet. HPV attacks a break in the skin of the foot. What increases the risk? You are more likely to develop this condition if you: Are between 10 and 20 years of age. Use public showers or locker rooms. Have a weakened body defense system (immune system). What are the signs or symptoms? Common symptoms of this condition include: Flat or slightly raised growths that have a rough surface and look similar to a callus. Pain when you use your foot to support your body weight. How is this diagnosed? A plantar wart can usually be diagnosed from its appearance. In some cases, a tissue sample may be removed (biopsy) to be looked at under a microscope. How is this treated? In many cases, warts do not need treatment. Without treatment, they often go away with time. If treatment is needed or desired, options may include: Applying medicated solutions, creams, or patches to the wart. These may be over-the-counter or prescription medicines that make the skin soft so that layers will gradually  shed away. In many cases, the medicine is applied one or two times a day and covered with a bandage. Freezing the wart with liquid nitrogen (cryotherapy). Burning the wart with: Laser treatment. An electrified probe (electrocautery). Injecting a medicine (Candida antigen) into the wart to help the body's immune system fight off the wart. Having surgery to remove the wart. Putting duct tape over the top of the wart (occlusion). You will leave the tape in place for as long as told by your health care provider, and then you will replace it with a new strip of tape. This is done until the wart goes away. Repeat treatment may be needed if you choose to remove warts. Warts sometimes go away and come back again. Follow these instructions at home: Apply medicated creams or solutions only as told by your health care provider. This may involve: Soaking the affected area in warm water. Removing the top layer of softened skin before you apply the medicine. A pumice stone works well for removing the skin. Applying a bandage over the affected area after you apply the medicine. Repeating the process daily or as told by your health care provider. Do not scratch or pick at a wart. Wash your hands after you touch a wart. If a wart is painful, try covering it with a bandage that has a hole in the middle. This helps to take pressure off the wart. Keep all follow-up visits as told by your health care provider. This is important. How   is this prevented? Take these actions to help prevent warts: Wear shoes and socks. Change your socks daily. Keep your feet clean and dry. Do not walk barefoot in shared locker rooms, shower areas, or swimming pools. Check your feet regularly. Avoid direct contact with warts on other people. Contact a health care provider if: Your warts do not improve after treatment. You have redness, swelling, or pain at the site of a wart. You have bleeding from a wart that does not stop with  light pressure. You have diabetes and you develop a wart. Summary Warts are small growths on the skin. When they occur on the underside (sole) of the foot, they are called plantar warts. In many cases, warts do not need treatment. Without treatment, they often go away with time. Apply medicated creams or solutions only as told by your health care provider. Do not scratch or pick at a wart. Wash your hands after you touch a wart. Keep all follow-up visits as told by your health care provider. This is important. This information is not intended to replace advice given to you by your health care provider. Make sure you discuss any questions you have with your health care provider. Document Revised: 05/12/2020 Document Reviewed: 05/12/2020 Elsevier Patient Education  2023 [employer]  

## 2022-04-26 NOTE — Progress Notes (Addendum)
Subjective:    Patient ID: Charles Beltran, male    DOB: [birth year], 72 y.o.   MRN: [id number]  Chief Complaint  Patient presents with   Medical Management of Chronic Issues   PT presents to the office today for chronic follow up.   He had lab work drawn prior to visit. He has had leukocytosis  over the last 9 months. He states he had pneumonia in the past and now has diarrhea. Will recheck and if still elevated may need referral to Hematologists.   He is complaining of plantar wart on bilateral ball of feet that has been there over a year. He has tried OTC compound W without relief. Reports mild pain 4 out 10 when walking.  Hypertension This is a chronic problem. The current episode started more than 1 year ago. The problem has been resolved since onset. The problem is controlled. Associated symptoms include peripheral edema (some times during the day). Pertinent negatives include no blurred vision, malaise/fatigue or shortness of breath. Risk factors for coronary artery disease include obesity, male gender, dyslipidemia and sedentary lifestyle. The current treatment provides moderate improvement.  Gastroesophageal Reflux He complains of belching and heartburn. This is a chronic problem. The current episode started more than 1 year ago. The problem occurs occasionally. The problem has been resolved. Risk factors include obesity. He has tried a PPI for the symptoms. The treatment provided moderate relief.  Diabetes He presents for his follow-up diabetic visit. He has type 2 diabetes mellitus. Pertinent negatives for diabetes include no blurred vision and no foot paresthesias. Symptoms are stable. Risk factors for coronary artery disease include dyslipidemia, diabetes mellitus, hypertension and sedentary lifestyle. He is following a generally healthy diet. (Does not check BS at home )  Hyperlipidemia This is a chronic problem. The current episode started more than 1 year ago. The problem is  controlled. Recent lipid tests were reviewed and are normal. Exacerbating diseases include obesity. Pertinent negatives include no shortness of breath. Current antihyperlipidemic treatment includes statins. The current treatment provides moderate improvement of lipids. Risk factors for coronary artery disease include dyslipidemia, diabetes mellitus, hypertension, male sex and a sedentary lifestyle.      Review of Systems  Constitutional:  Negative for malaise/fatigue.  Eyes:  Negative for blurred vision.  Respiratory:  Negative for shortness of breath.   Gastrointestinal:  Positive for heartburn.  All other systems reviewed and are negative.      Objective:   Physical Exam Vitals reviewed.  Constitutional:      General: He is not in acute distress.    Appearance: He is well-developed.  HENT:     Head: Normocephalic.     Right Ear: Tympanic membrane normal.     Left Ear: Tympanic membrane normal.  Eyes:     General:        Right eye: No discharge.        Left eye: No discharge.     Pupils: Pupils are equal, round, and reactive to light.  Neck:     Thyroid: No thyromegaly.  Cardiovascular:     Rate and Rhythm: Normal rate and regular rhythm.     Heart sounds: Murmur heard.  Pulmonary:     Effort: Pulmonary effort is normal. No respiratory distress.     Breath sounds: Normal breath sounds. No wheezing.  Abdominal:     General: Bowel sounds are normal. There is no distension.     Palpations: Abdomen is soft.  Tenderness: There is no abdominal tenderness.  Musculoskeletal:        General: No tenderness. Normal range of motion.     Cervical back: Normal range of motion and neck supple.       Feet:  Skin:    General: Skin is warm and dry.     Findings: No erythema or rash.  Neurological:     Mental Status: He is alert and oriented to person, place, and time.     Cranial Nerves: No cranial nerve deficit.     Deep Tendon Reflexes: Reflexes are normal and symmetric.   Psychiatric:        Behavior: Behavior normal.        Thought Content: Thought content normal.        Judgment: Judgment normal.       Cryotherapy used on bilateral ball of foot.    BP 129/73   Pulse 63   Temp (!) 97 F (36.1 C)   Ht 5\' 9"  (1.753 m)   Wt 169 lb 3.2 oz (76.7 kg)   SpO2 98%   BMI 24.99 kg/m      Assessment & Plan:   JESHAUN JEANPIERRE comes in today with chief complaint of Medical Management of Chronic Issues   Diagnosis and orders addressed:  1. Primary hypertension - CBC with Differential/Platelet - DG Chest 2 View  2. Type 2 diabetes mellitus with other specified complication, without long-term current use of insulin (HCC) - empagliflozin (JARDIANCE) 10 MG TABS tablet; Take 1 tablet (10 mg total) by mouth daily before breakfast.  Dispense: 90 tablet; Refill: 1 - CBC with Differential/Platelet - DG Chest 2 View  3. Gastroesophageal reflux disease, unspecified whether esophagitis present - CBC with Differential/Platelet - DG Chest 2 View  4. Overweight (BMI 25.0-29.9) - CBC with Differential/Platelet - DG Chest 2 View  5. Vitamin D deficiency - CBC with Differential/Platelet - DG Chest 2 View  6. Hyperlipidemia associated with type 2 diabetes mellitus ([place]) - CBC with Differential/Platelet - DG Chest 2 View  7. Leukocytosis, unspecified type - CBC with Differential/Platelet - DG Chest 2 View - If still elevated will need referral to Hematologists   8. Plantar wart Cryotherapy used, discussed blister may appear    Labs pending Health Maintenance reviewed Diet and exercise encouraged  Follow up plan: 6 months    Evelina Dun, FNP

## 2022-04-26 NOTE — Telephone Encounter (Signed)
Patient would like to speak to nurse about lowering his atorvastatin (LIPITOR) 40 MG tablet to 20 mg. He had labs on 3/18 and had an appt with PCP on 3/21 but said he did not mention this to PCP. Made him aware that he may need to come in to discuss this with provider. Please call back and advise.

## 2022-04-27 ENCOUNTER — Other Ambulatory Visit: Payer: Self-pay | Admitting: Family

## 2022-04-27 MED ORDER — OMEPRAZOLE 20 MG PO CPDR: 20.0000 mg | DELAYED_RELEASE_CAPSULE | Freq: Every day | ORAL | 1 refills | Status: DC

## 2022-04-27 MED ORDER — ATORVASTATIN CALCIUM 20 MG PO TABS: 20.0000 mg | ORAL_TABLET | Freq: Every day | ORAL | 1 refills | Status: DC

## 2022-04-27 NOTE — Telephone Encounter (Signed)
Patient aware and verbalized understanding. °

## 2022-04-27 NOTE — Telephone Encounter (Signed)
I have sent a new rx of omeprazole 20 mg and Lipitor 20 mg to your pharmacy.

## 2022-04-30 ENCOUNTER — Other Ambulatory Visit: Payer: Self-pay | Admitting: Family

## 2022-04-30 MED ORDER — DOXYCYCLINE HYCLATE 100 MG PO TABS: 100.0000 mg | ORAL_TABLET | Freq: Two times a day (BID) | ORAL | 0 refills | Status: DC

## 2022-05-10 ENCOUNTER — Telehealth: Payer: PPO

## 2022-05-10 ENCOUNTER — Ambulatory Visit (INDEPENDENT_AMBULATORY_CARE_PROVIDER_SITE_OTHER): Payer: PPO | Admitting: *Deleted

## 2022-05-10 DIAGNOSIS — I10 Essential (primary) hypertension: Secondary | ICD-10-CM

## 2022-05-10 DIAGNOSIS — E1169 Type 2 diabetes mellitus with other specified complication: Secondary | ICD-10-CM

## 2022-05-10 LAB — HM DIABETES EYE EXAM

## 2022-05-10 NOTE — Chronic Care Management (AMB) (Signed)
Chronic Care Management   CCM RN Visit Note  05/10/2022 Name: Charles Beltran MRN: [id number] DOB: September 23, 1950  Subjective: Charles Beltran is a 72 y.o. year old male who is a primary care patient of Sharion Balloon, FNP. The patient was referred to the Chronic Care Management team for assistance with care management needs subsequent to provider initiation of CCM services and plan of care.    Today's Visit:  Engaged with patient by telephone for follow up visit.        Goals Addressed             This Visit's Progress    CCM (HYPERLIPIDEMIA) EXPECTED OUTCOME: MONITOR, SELF-MANAGE AND REDUCE SYMPTOMS OF HYPERLIPIDEMIA       Current Barriers:  Knowledge Deficits related to management of Hyperlipidemia Chronic Disease Management support and education needs related to Hyperlipidemia and diet No Advanced Directives in place- pt declines information Patient reports he is independent with all aspects of his care, continues to drive, lives alone, does not follow a special diet, does not exercise. Patient has diagnosis Pre-diabetes, is on jardiance, has glucometer but has not checked CBG, pt is contemplating and states he may decide to start checking, reports "pharmacist helped me and I know how",  patient reports today he still is not checking CBG and still has not made decision to do so. Patient is pleased that recent [place] on 04/23/22 is 6.0, states CBG reading at MD visit is 119.  Planned Interventions: Provider established cholesterol goals reviewed; Counseled on importance of regular laboratory monitoring as prescribed; Reviewed role and benefits of statin for ASCVD risk reduction; Reviewed importance of limiting foods high in cholesterol; Reviewed exercise goals and target of 150 minutes per week; Reinforced heart healthy diet   Symptom Management: Take medications as prescribed   Attend all scheduled provider appointments Call pharmacy for medication refills 3-7 days in advance of  running out of medications Perform all self care activities independently  Perform IADL's (shopping, preparing meals, housekeeping, managing finances) independently Call provider office for new concerns or questions  - call for medicine refill 2 or 3 days before it runs out - take all medications exactly as prescribed - call doctor with any symptoms you believe are related to your medicine - call doctor when you experience any new symptoms - go to all doctor appointments as scheduled - adhere to prescribed diet: heart healthy - develop an exercise routine Consider checking blood sugar Read food labels for sodium content, avoid/ limit fast food   Follow Up Plan: Telephone follow up appointment with care management team member scheduled for:  08/02/22 at 215 pm       CCM (HYPERTENSION) EXPECTED OUTCOME: MONITOR, SELF-MANAGE AND REDUCE SYMPTOMS OF HYPERTENSION       Current Barriers:  Knowledge Deficits related to management of Hypertension Chronic Disease Management support and education needs related to Hypertension and diet No Advanced Directives in place- pt declines Patient reports he lives alone, has someone he can call on if needed for assistance, pt reports he does not check blood pressure and feels having checked at provider appointments is sufficient.  Pt reports he does not exercise, does not follow a special diet. Patient states overall he is doing well, states he has all medications and taking as prescribed. Patient reports he had "lung infection" and was on antibiotic and finished yesterday, states "I feel much better"  Planned Interventions: Evaluation of current treatment plan related to hypertension self management and patient's  adherence to plan as established by provider;   Reviewed medications with patient and discussed importance of compliance;  Counseled on the importance of exercise goals with target of 150 minutes per week Advised patient, providing education and  rationale, to monitor blood pressure daily and record, calling PCP for findings outside established parameters;  Discussed complications of poorly controlled blood pressure such as heart disease, stroke, circulatory complications, vision complications, kidney impairment, sexual dysfunction;  Reinforced low sodium diet  Symptom Management: Take medications as prescribed   Attend all scheduled provider appointments Call pharmacy for medication refills 3-7 days in advance of running out of medications Perform all self care activities independently  Perform IADL's (shopping, preparing meals, housekeeping, managing finances) independently Call provider office for new concerns or questions  check blood pressure weekly choose a place to take my blood pressure (home, clinic or office, retail store) write blood pressure results in a log or diary keep a blood pressure log take blood pressure log to all doctor appointments keep all doctor appointments take medications for blood pressure exactly as prescribed begin an exercise program report new symptoms to your doctor eat more whole grains, fruits and vegetables, lean meats and healthy fats Consider starting to check blood pressure at home and keeping a log Follow low sodium diet- read food labels, limit fast food  Follow Up Plan: Telephone follow up appointment with care management team member scheduled for:  08/02/22 at 215 pm          Plan:Telephone follow up appointment with care management team member scheduled for:  08/02/22 at 215 pm  Jacqlyn Larsen [facility], BSN RN Case Manager [place] [phone]

## 2022-05-10 NOTE — Patient Instructions (Signed)
Please call the care guide team at [phone] if you need to cancel or reschedule your appointment.   If you are experiencing a Mental Health or [place] or need someone to talk to, please call the Suicide and Crisis Lifeline: 988 call the [place] National Suicide Prevention Lifeline: 4040527832 or TTY: [phone] TTY [phone]) to talk to a trained counselor call 1-800-273-TALK (toll free, 24 hour hotline) go to [facility] [address], [place] [phone]) call the [facility]: [phone] call 911   Following is a copy of the CCM Program Consent:  CCM service includes personalized support from designated clinical staff supervised by the physician, including individualized plan of care and coordination with other care providers 24/7 contact phone numbers for assistance for urgent and routine care needs. Service will only be billed when office clinical staff spend 20 minutes or more in a month to coordinate care. Only one practitioner may furnish and bill the service in a calendar month. The patient may stop CCM services at amy time (effective at the end of the month) by phone call to the office staff. The patient will be responsible for cost sharing (co-pay) or up to 20% of the service fee (after annual deductible is met)  Following is a copy of your full provider care plan:   Goals Addressed             This Visit's Progress    CCM (HYPERLIPIDEMIA) EXPECTED OUTCOME: MONITOR, SELF-MANAGE AND REDUCE SYMPTOMS OF HYPERLIPIDEMIA       Current Barriers:  Knowledge Deficits related to management of Hyperlipidemia Chronic Disease Management support and education needs related to Hyperlipidemia and diet No Advanced Directives in place- pt declines information Patient reports he is independent with all aspects of his care, continues to drive, lives alone, does not follow a special diet, does not  exercise. Patient has diagnosis Pre-diabetes, is on jardiance, has glucometer but has not checked CBG, pt is contemplating and states he may decide to start checking, reports "pharmacist helped me and I know how",  patient reports today he still is not checking CBG and still has not made decision to do so. Patient is pleased that recent Stuart on 04/23/22 is 6.0, states CBG reading at MD visit is 119.  Planned Interventions: Provider established cholesterol goals reviewed; Counseled on importance of regular laboratory monitoring as prescribed; Reviewed role and benefits of statin for ASCVD risk reduction; Reviewed importance of limiting foods high in cholesterol; Reviewed exercise goals and target of 150 minutes per week; Reinforced heart healthy diet   Symptom Management: Take medications as prescribed   Attend all scheduled provider appointments Call pharmacy for medication refills 3-7 days in advance of running out of medications Perform all self care activities independently  Perform IADL's (shopping, preparing meals, housekeeping, managing finances) independently Call provider office for new concerns or questions  - call for medicine refill 2 or 3 days before it runs out - take all medications exactly as prescribed - call doctor with any symptoms you believe are related to your medicine - call doctor when you experience any new symptoms - go to all doctor appointments as scheduled - adhere to prescribed diet: heart healthy - develop an exercise routine Consider checking blood sugar Read food labels for sodium content, avoid/ limit fast food   Follow Up Plan: Telephone follow up appointment with care management team member scheduled for:  08/02/22 at 215 pm  CCM (HYPERTENSION) EXPECTED OUTCOME: MONITOR, SELF-MANAGE AND REDUCE SYMPTOMS OF HYPERTENSION       Current Barriers:  Knowledge Deficits related to management of Hypertension Chronic Disease Management support and  education needs related to Hypertension and diet No Advanced Directives in place- pt declines Patient reports he lives alone, has someone he can call on if needed for assistance, pt reports he does not check blood pressure and feels having checked at provider appointments is sufficient.  Pt reports he does not exercise, does not follow a special diet. Patient states overall he is doing well, states he has all medications and taking as prescribed. Patient reports he had "lung infection" and was on antibiotic and finished yesterday, states "I feel much better"  Planned Interventions: Evaluation of current treatment plan related to hypertension self management and patient's adherence to plan as established by provider;   Reviewed medications with patient and discussed importance of compliance;  Counseled on the importance of exercise goals with target of 150 minutes per week Advised patient, providing education and rationale, to monitor blood pressure daily and record, calling PCP for findings outside established parameters;  Discussed complications of poorly controlled blood pressure such as heart disease, stroke, circulatory complications, vision complications, kidney impairment, sexual dysfunction;  Reinforced low sodium diet  Symptom Management: Take medications as prescribed   Attend all scheduled provider appointments Call pharmacy for medication refills 3-7 days in advance of running out of medications Perform all self care activities independently  Perform IADL's (shopping, preparing meals, housekeeping, managing finances) independently Call provider office for new concerns or questions  check blood pressure weekly choose a place to take my blood pressure (home, clinic or office, retail store) write blood pressure results in a log or diary keep a blood pressure log take blood pressure log to all doctor appointments keep all doctor appointments take medications for blood pressure  exactly as prescribed begin an exercise program report new symptoms to your doctor eat more whole grains, fruits and vegetables, lean meats and healthy fats Consider starting to check blood pressure at home and keeping a log Follow low sodium diet- read food labels, limit fast food  Follow Up Plan: Telephone follow up appointment with care management team member scheduled for:  08/02/22 at 215 pm          The patient verbalized understanding of instructions, educational materials, and care plan provided today and DECLINED offer to receive copy of patient instructions, educational materials, and care plan.   Telephone follow up appointment with care management team member scheduled for:

## 2022-05-17 ENCOUNTER — Encounter: Payer: Self-pay | Admitting: Family

## 2022-05-21 ENCOUNTER — Encounter: Payer: Self-pay | Admitting: Family

## 2022-05-21 ENCOUNTER — Ambulatory Visit (INDEPENDENT_AMBULATORY_CARE_PROVIDER_SITE_OTHER): Payer: PPO

## 2022-05-21 ENCOUNTER — Ambulatory Visit (INDEPENDENT_AMBULATORY_CARE_PROVIDER_SITE_OTHER): Payer: PPO | Admitting: Family

## 2022-05-21 VITALS — BP 124/51 | HR 65 | Temp 98.0°F | Ht 69.0 in | Wt 171.0 lb

## 2022-05-21 DIAGNOSIS — I700 Atherosclerosis of aorta: Secondary | ICD-10-CM | POA: Diagnosis not present

## 2022-05-21 DIAGNOSIS — J189 Pneumonia, unspecified organism: Secondary | ICD-10-CM

## 2022-05-21 DIAGNOSIS — B070 Plantar wart: Secondary | ICD-10-CM | POA: Diagnosis not present

## 2022-05-21 DIAGNOSIS — R9389 Abnormal findings on diagnostic imaging of other specified body structures: Secondary | ICD-10-CM | POA: Diagnosis not present

## 2022-05-21 NOTE — Patient Instructions (Signed)
Community-Acquired Pneumonia, Adult Pneumonia is a lung infection that causes inflammation and the buildup of mucus and fluids in the lungs. This may cause coughing and difficulty breathing. Community-acquired pneumonia is pneumonia that develops in people who are not, and have not recently been, in a hospital or other health care facility. Usually, pneumonia develops as a result of an illness that is caused by a virus, such as the common cold and the flu (influenza). It can also be caused by bacteria or fungi. While the common cold and influenza can pass from person to person (are contagious), pneumonia itself is not considered contagious. What are the causes? This condition may be caused by: Viruses. Bacteria. Fungi. What increases the risk? The following factors may make you more likely to develop this condition: Being over age 65 or having certain medical conditions, such as: A long-term (chronic) disease, such as: chronic obstructive pulmonary disease (COPD), asthma, heart failure, diabetes, or kidney disease. A condition that increases the risk of breathing in (aspirating) mucus and other fluids from your mouth and nose. A weakened body defense system (immune system). Having had your spleen removed (splenectomy). The spleen is the organ that helps fight germs and infections. Not cleaning your teeth and gums well (poor dental hygiene). Using tobacco products. Traveling to places where germs that cause pneumonia are present or being near certain animals or animal habitats that could have germs that cause pneumonia. What are the signs or symptoms? Symptoms of this condition include: A dry cough or a wet (productive) cough. A fever, sweating, or chills. Chest pain, especially when breathing deeply or coughing. Fast breathing, difficulty breathing, or shortness of breath. Tiredness (fatigue) and muscle aches. How is this diagnosed? This condition may be diagnosed based on your medical  history or a physical exam. You may also have tests, including: Imaging, such as a chest X-ray or lung ultrasound. Tests of: The level of oxygen and other gases in your blood. Mucus from your lungs (sputum). Fluid around your lungs (pleural fluid). Your urine. How is this treated? Treatment for this condition depends on many factors, such as the cause of your pneumonia, your medicines, and other medical conditions that you have. For most adults, pneumonia may be treated at home. In some cases, treatment must happen in a hospital and may include: Medicines that are given by mouth (orally) or through an IV, including: Antibiotic medicines, if bacteria caused the pneumonia. Medicines that kill viruses (antiviral medicines), if a virus caused the pneumonia. Oxygen therapy. Severe pneumonia, although rare, may require the following treatments: Mechanical ventilation.This procedure uses a machine to help you breathe if you cannot breathe well on your own or maintain a safe level of blood oxygen. Thoracentesis. This procedure removes any buildup of pleural fluid to help with breathing. Follow these instructions at home:  Medicines Take over-the-counter and prescription medicines only as told by your health care provider. Take cough medicine only if you have trouble sleeping. Cough medicine can prevent your body from removing mucus from your lungs. If you were prescribed antibiotics, take them as told by your health care provider. Do not stop taking the antibiotic even if you start to feel better. Lifestyle     Do not drink alcohol. Do not use any products that contain nicotine or tobacco. These products include cigarettes, chewing tobacco, and vaping devices, such as e-cigarettes. If you need help quitting, ask your health care provider. Eat a healthy diet. This includes plenty of vegetables, fruits, whole grains, low-fat   dairy products, and lean protein. General instructions Rest a lot and  get at least 8 hours of sleep each night. Sleep in a partly upright position at night. Place a few pillows under your head or sleep in a reclining chair. Return to your normal activities as told by your health care provider. Ask your health care provider what activities are safe for you. Drink enough fluid to keep your urine pale yellow. This helps to thin the mucus in your lungs. If your throat is sore, gargle with a mixture of salt and water 3-4 times a day or as needed. To make salt water, completely dissolve -1 tsp (3-6 g) of salt in 1 cup (237 mL) of warm water. Keep all follow-up visits. How is this prevented? You can lower your risk of developing community-acquired pneumonia by: Getting the pneumonia vaccine. There are different types and schedules of pneumonia vaccines. Ask your health care provider which option is best for you. Consider getting the pneumonia vaccine if: You are older than 71 years of age. You are 19-65 years of age and are receiving cancer treatment, have chronic lung disease, or have other medical conditions that affect your immune system. Ask your health care provider if this applies to you. Getting your influenza vaccine every year. Ask your health care provider which type of vaccine is best for you. Getting regular dental checkups. Washing your hands often with soap and water for at least 20 seconds. If soap and water are not available, use hand sanitizer. Contact a health care provider if: You have a fever. You have trouble sleeping because you cannot control your cough with cough medicine. Get help right away if: Your shortness of breath becomes worse. Your chest pain increases. Your sickness becomes worse, especially if you are an older adult or have a weak immune system. You cough up blood. These symptoms may be an emergency. Get help right away. Call 911. Do not wait to see if the symptoms will go away. Do not drive yourself to the  hospital. Summary Pneumonia is an infection of the lungs. Community-acquired pneumonia develops in people who have not been in the hospital. It can be caused by bacteria, viruses, or fungi. This condition may be treated with antibiotics or antiviral medicines. Severe pneumonia may require a hospital stay and treatment to help with breathing. This information is not intended to replace advice given to you by your health care provider. Make sure you discuss any questions you have with your health care provider. Document Revised: 03/22/2021 Document Reviewed: 03/22/2021 Elsevier Patient Education  2023 [employer] Inc.  

## 2022-05-21 NOTE — Progress Notes (Signed)
Subjective:    Patient ID: Charles Beltran, male    DOB: December 25, 1950, 72 y.o.   MRN: [id number]  Chief Complaint  Patient presents with   Follow-up    HPI PT presents to the office today to follow up on chest x-ray. His x-ray showed, "Interval resolution of right-sided pleural effusion, with increased reticulonodular opacities in the upper lungs particularly on the left concerning for infection given the history. Followup PA and lateral chest X-ray is recommended in 3-4 weeks following therapy to assure resolution."  He completed his doxycyline. Denies any fevers, SOB, cough, or chest pain.    He complaining of plantar warts on bilateral ball of feet. Reports mild aching pain 5 out 10. Reports it blistered from last visit and helped, but still there.    Review of Systems  Skin:        Plantar wart  All other systems reviewed and are negative.      Objective:   Physical Exam Vitals reviewed.  Constitutional:      General: He is not in acute distress.    Appearance: He is well-developed. He is obese.  HENT:     Head: Normocephalic.     Right Ear: Tympanic membrane normal.     Left Ear: Tympanic membrane normal.  Eyes:     General:        Right eye: No discharge.        Left eye: No discharge.     Pupils: Pupils are equal, round, and reactive to light.  Neck:     Thyroid: No thyromegaly.  Cardiovascular:     Rate and Rhythm: Normal rate and regular rhythm.     Heart sounds: Normal heart sounds. No murmur heard. Pulmonary:     Effort: Pulmonary effort is normal. No respiratory distress.     Breath sounds: Normal breath sounds. No wheezing.  Abdominal:     General: Bowel sounds are normal. There is no distension.     Palpations: Abdomen is soft.     Tenderness: There is no abdominal tenderness.  Musculoskeletal:        General: No tenderness. Normal range of motion.     Cervical back: Normal range of motion and neck supple.       Feet:  Skin:    General: Skin is  warm and dry.     Findings: No erythema or rash.  Neurological:     Mental Status: He is alert and oriented to person, place, and time.     Cranial Nerves: No cranial nerve deficit.     Deep Tendon Reflexes: Reflexes are normal and symmetric.  Psychiatric:        Behavior: Behavior normal.        Thought Content: Thought content normal.        Judgment: Judgment normal.    Cryotherapy used on bilateral feet.   BP (!) 124/51   Pulse 65   Temp 98 F (36.7 C) (Temporal)   Ht  (1.753 m)   Wt 171 lb (77.6 kg)   SpO2 97%   BMI 25.25 kg/m       Assessment & Plan:   Charles Beltran comes in today with chief complaint of Follow-up   Diagnosis and orders addressed:  1. Community acquired pneumonia of left upper lobe of lung X-ray pending  Force fluids  - Take meds as prescribed - Use a cool mist humidifier  -Use saline nose sprays frequently -Force fluids -For  any cough or congestion  Use plain Mucinex- regular strength or max strength is fine -For fever or aces or pains- take tylenol or ibuprofen. -Throat lozenges if help -Follow up if symptoms worsen or do not improve  - DG Chest 2 View   2. Plantar wart Avoid picking    Jannifer Rodney, FNP

## 2022-05-24 ENCOUNTER — Telehealth: Payer: Self-pay | Admitting: Family

## 2022-05-24 NOTE — Telephone Encounter (Signed)
Patient aware not back yet 

## 2022-05-24 NOTE — Telephone Encounter (Signed)
LMTCB

## 2022-05-24 NOTE — Telephone Encounter (Signed)
R/c

## 2022-05-24 NOTE — Telephone Encounter (Signed)
Patient calling back about x ray results from 4/15, please  call back

## 2022-06-05 DIAGNOSIS — E785 Hyperlipidemia, unspecified: Secondary | ICD-10-CM

## 2022-06-05 DIAGNOSIS — I10 Essential (primary) hypertension: Secondary | ICD-10-CM | POA: Diagnosis not present

## 2022-06-09 ENCOUNTER — Other Ambulatory Visit: Payer: Self-pay | Admitting: Family

## 2022-06-14 ENCOUNTER — Encounter: Payer: Self-pay | Admitting: Family

## 2022-06-21 ENCOUNTER — Encounter: Payer: Self-pay | Admitting: Family Medicine

## 2022-06-21 ENCOUNTER — Ambulatory Visit (INDEPENDENT_AMBULATORY_CARE_PROVIDER_SITE_OTHER): Payer: PPO | Admitting: Family Medicine

## 2022-06-21 VITALS — BP 120/57 | HR 69 | Temp 98.2°F | Ht 69.0 in | Wt 166.1 lb

## 2022-06-21 DIAGNOSIS — B070 Plantar wart: Secondary | ICD-10-CM | POA: Diagnosis not present

## 2022-06-21 NOTE — Progress Notes (Signed)
   Acute Office Visit  Subjective:     Patient ID: Charles Beltran, male    DOB: 12-15-1950, 72 y.o.   MRN: [id number]  Chief Complaint  Patient presents with   Plantar Warts    HPI Patient is in today for plantar warts. He has plantar warts on both feet. They have been present for month. He has failed OTC treatments. He has had his cryotherapy x 2 with some improvement each time, with last done 6 weeks or so ago. He would like to have this done again today. He reports mild discomfort because of them.   ROS As per HPI.      Objective:    BP (!) 120/57   Pulse 69   Temp 98.2 F (36.8 C) (Temporal)   Ht 5\' 9"  (1.753 m)   Wt 166 lb 2 oz (75.4 kg)   SpO2 96%   BMI 24.53 kg/m    Physical Exam Vitals and nursing note reviewed.  Musculoskeletal:     Comments: Plantar warts to bilateral lateral portions of feet. No signs of infection.   Skin:    General: Skin is warm and dry.  Neurological:     General: No focal deficit present.     Mental Status: He is oriented to person, place, and time.  Psychiatric:        Mood and Affect: Mood normal.        Behavior: Behavior normal.     No results found for any visits on 06/21/22.   Cryotherapy Procedure:  Risks and benefits of procedure were reviewed with the patient.  Written consent obtained and scanned into the chart.  Lesion of concern was identified and located on right and left foot.  Liquid nitrogen was applied to area of concern and extending out 2 millimeters beyond the border of the lesion.  Treated area was allowed to come back to room temperature before treating it a second time.  Patient tolerated procedure well and there were no immediate complications.  Home care instructions were reviewed with the patient and a handout was provided.     Assessment & Plan:   Charles Beltran was seen today for plantar warts.  Diagnoses and all orders for this visit:  Plantar warts Cryotherapy done today. Discussed home care. Discussed  referral to podiatry if this does not resolve.   Return to office for new or worsening symptoms, or if symptoms persist.   The patient indicates understanding of these issues and agrees with the plan.  Charles Earing, FNP

## 2022-06-21 NOTE — Patient Instructions (Signed)
Charles Beltran DPM [place]iatrist [place] Address: [address] [place], [place], [place] [address] Phone: [phone]

## 2022-07-04 ENCOUNTER — Telehealth: Payer: Self-pay | Admitting: Family

## 2022-07-04 NOTE — Telephone Encounter (Signed)
Pt says for the past several nights he has been waking up with some abdominal pain. Says he has also had some diarrhea. Says he's taking Pepcid which seems to help some but just wants advise on what could be wrong or what else he could take to help. Says he has been taking Omeprazole for the past 3 years for acid reflux but says he stopped taking it a few weeks ago because he was thinking it could be the reason causing his abdominal pain and since stopping the medicine he has not had any acid reflux but is still having the abdominal pain.

## 2022-07-04 NOTE — Telephone Encounter (Signed)
If his abdominal pain continues then please have him make an appointment to come in and be seen.  It is difficult to know 100% what is going on without doing an examination.  He can continue with Pepcid for now.

## 2022-07-04 NOTE — Telephone Encounter (Signed)
Left message for pt to return call.   When he calls back he needs to be scheduled with [facility].

## 2022-07-05 ENCOUNTER — Encounter: Payer: Self-pay | Admitting: Family

## 2022-07-05 ENCOUNTER — Ambulatory Visit (INDEPENDENT_AMBULATORY_CARE_PROVIDER_SITE_OTHER): Payer: PPO | Admitting: Family

## 2022-07-05 VITALS — BP 125/61 | HR 80 | Temp 98.1°F | Resp 18 | Ht 69.0 in | Wt 163.0 lb

## 2022-07-05 DIAGNOSIS — B070 Plantar wart: Secondary | ICD-10-CM | POA: Diagnosis not present

## 2022-07-05 DIAGNOSIS — R1084 Generalized abdominal pain: Secondary | ICD-10-CM | POA: Diagnosis not present

## 2022-07-05 DIAGNOSIS — K219 Gastro-esophageal reflux disease without esophagitis: Secondary | ICD-10-CM

## 2022-07-05 MED ORDER — FAMOTIDINE 20 MG PO TABS: 20.0000 mg | ORAL_TABLET | Freq: Two times a day (BID) | ORAL | 2 refills | Status: DC

## 2022-07-05 NOTE — Patient Instructions (Signed)
Gastroesophageal Reflux Disease, Adult Gastroesophageal reflux (GER) happens when acid from the stomach flows up into the tube that connects the mouth and the stomach (esophagus). Normally, food travels down the esophagus and stays in the stomach to be digested. However, when a person has GER, food and stomach acid sometimes move back up into the esophagus. If this becomes a more serious problem, the person may be diagnosed with a disease called gastroesophageal reflux disease (GERD). GERD occurs when the reflux: Happens often. Causes frequent or severe symptoms. Causes problems such as damage to the esophagus. When stomach acid comes in contact with the esophagus, the acid may cause inflammation in the esophagus. Over time, GERD may create small holes (ulcers) in the lining of the esophagus. What are the causes? This condition is caused by a problem with the muscle between the esophagus and the stomach (lower esophageal sphincter, or LES). Normally, the LES muscle closes after food passes through the esophagus to the stomach. When the LES is weakened or abnormal, it does not close properly, and that allows food and stomach acid to go back up into the esophagus. The LES can be weakened by certain dietary substances, medicines, and medical conditions, including: Tobacco use. Pregnancy. Having a hiatal hernia. Alcohol use. Certain foods and beverages, such as coffee, chocolate, onions, and peppermint. What increases the risk? You are more likely to develop this condition if you: Have an increased body weight. Have a connective tissue disorder. Take NSAIDs, such as ibuprofen. What are the signs or symptoms? Symptoms of this condition include: Heartburn. Difficult or painful swallowing and the feeling of having a lump in the throat. A bitter taste in the mouth. Bad breath and having a large amount of saliva. Having an upset or bloated stomach and belching. Chest pain. Different conditions can  cause chest pain. Make sure you see your health care provider if you experience chest pain. Shortness of breath or wheezing. Ongoing (chronic) cough or a nighttime cough. Wearing away of tooth enamel. Weight loss. How is this diagnosed? This condition may be diagnosed based on a medical history and a physical exam. To determine if you have mild or severe GERD, your health care provider may also monitor how you respond to treatment. You may also have tests, including: A test to examine your stomach and esophagus with a small camera (endoscopy). A test that measures the acidity level in your esophagus. A test that measures how much pressure is on your esophagus. A barium swallow or modified barium swallow test to show the shape, size, and functioning of your esophagus. How is this treated? Treatment for this condition may vary depending on how severe your symptoms are. Your health care provider may recommend: Changes to your diet. Medicine. Surgery. The goal of treatment is to help relieve your symptoms and to prevent complications. Follow these instructions at home: Eating and drinking  Follow a diet as recommended by your health care provider. This may involve avoiding foods and drinks such as: Coffee and tea, with or without caffeine. Drinks that contain alcohol. Energy drinks and sports drinks. Carbonated drinks or sodas. Chocolate and cocoa. Peppermint and mint flavorings. Garlic and onions. Horseradish. Spicy and acidic foods, including peppers, chili powder, curry powder, vinegar, hot sauces, and barbecue sauce. Citrus fruit juices and citrus fruits, such as oranges, lemons, and limes. Tomato-based foods, such as red sauce, chili, salsa, and pizza with red sauce. Fried and fatty foods, such as donuts, french fries, potato chips, and high-fat dressings.  High-fat meats, such as hot dogs and fatty cuts of red and white meats, such as rib eye steak, sausage, ham, and  bacon. High-fat dairy items, such as whole milk, butter, and cream cheese. Eat small, frequent meals instead of large meals. Avoid drinking large amounts of liquid with your meals. Avoid eating meals during the 2-3 hours before bedtime. Avoid lying down right after you eat. Do not exercise right after you eat. Lifestyle  Do not use any products that contain nicotine or tobacco. These products include cigarettes, chewing tobacco, and vaping devices, such as e-cigarettes. If you need help quitting, ask your health care provider. Try to reduce your stress by using methods such as yoga or meditation. If you need help reducing stress, ask your health care provider. If you are overweight, reduce your weight to an amount that is healthy for you. Ask your health care provider for guidance about a safe weight loss goal. General instructions Pay attention to any changes in your symptoms. Take over-the-counter and prescription medicines only as told by your health care provider. Do not take aspirin, ibuprofen, or other NSAIDs unless your health care provider told you to take these medicines. Wear loose-fitting clothing. Do not wear anything tight around your waist that causes pressure on your abdomen. Raise (elevate) the head of your bed about 6 inches (15 cm). You can use a wedge to do this. Avoid bending over if this makes your symptoms worse. Keep all follow-up visits. This is important. Contact a health care provider if: You have: New symptoms. Unexplained weight loss. Difficulty swallowing or it hurts to swallow. Wheezing or a persistent cough. A hoarse voice. Your symptoms do not improve with treatment. Get help right away if: You have sudden pain in your arms, neck, jaw, teeth, or back. You suddenly feel sweaty, dizzy, or light-headed. You have chest pain or shortness of breath. You vomit and the vomit is green, yellow, or black, or it looks like blood or coffee grounds. You faint. You  have stool that is red, bloody, or black. You cannot swallow, drink, or eat. These symptoms may represent a serious problem that is an emergency. Do not wait to see if the symptoms will go away. Get medical help right away. Call your local emergency services (911 in the [place] Do not drive yourself to the hospital. Summary Gastroesophageal reflux happens when acid from the stomach flows up into the esophagus. GERD is a disease in which the reflux happens often, causes frequent or severe symptoms, or causes problems such as damage to the esophagus. Treatment for this condition may vary depending on how severe your symptoms are. Your health care provider may recommend diet and lifestyle changes, medicine, or surgery. Contact a health care provider if you have new or worsening symptoms. Take over-the-counter and prescription medicines only as told by your health care provider. Do not take aspirin, ibuprofen, or other NSAIDs unless your health care provider told you to do so. Keep all follow-up visits as told by your health care provider. This is important. This information is not intended to replace advice given to you by your health care provider. Make sure you discuss any questions you have with your health care provider. Document Revised: 08/03/2019 Document Reviewed: 08/03/2019 Elsevier Patient Education  2024 [employer].

## 2022-07-05 NOTE — Progress Notes (Signed)
Subjective:    Patient ID: Charles Beltran, male    DOB: 03-05-1950, 72 y.o.   MRN: [id number]  Chief Complaint  Patient presents with   Abdominal Pain    Lower Monday and Tuesday night. He did eat something that might of caused it. Patient stated he stopped his omeprazole because he thought that might been causing his PNA that he has had 3 times recently. And he stopped it 2 weeks ago.   PT presents to the office today with lower abdominal pain that started Monday and Tuesday evneing. Has resolved after taking Pepcid that greatly helped. Thinks he ate spicy foods on Tuesday that caused it   He stopped his Omeprazole because he thought this caused his pneumonia.   He has had diffuse abdominal pain on and off over the last few years. We have done multiple GI referrals, but has refused.   Also complaining of plantar wart on bilateral foot.  Abdominal Pain This is a new problem. The current episode started in the past 7 days. The problem occurs intermittently. The problem has been resolved. The pain is located in the RUQ and LUQ. The pain is at a severity of 3/10. The pain is mild. The quality of the pain is dull. Associated symptoms include belching and diarrhea. Pertinent negatives include no constipation, dysuria, fever, flatus, frequency, headaches, hematochezia, hematuria, myalgias, nausea or vomiting. He has tried H2 blockers for the symptoms. The treatment provided mild relief.      Review of Systems  Constitutional:  Negative for fever.  Gastrointestinal:  Positive for abdominal pain and diarrhea. Negative for constipation, flatus, hematochezia, nausea and vomiting.  Genitourinary:  Negative for dysuria, frequency and hematuria.  Musculoskeletal:  Negative for myalgias.  Neurological:  Negative for headaches.  All other systems reviewed and are negative.      Objective:   Physical Exam Vitals reviewed.  Constitutional:      General: He is not in acute distress.    Appearance:  He is well-developed.  HENT:     Head: Normocephalic.     Right Ear: External ear normal.     Left Ear: External ear normal.  Eyes:     General:        Right eye: No discharge.        Left eye: No discharge.     Pupils: Pupils are equal, round, and reactive to light.  Neck:     Thyroid: No thyromegaly.  Cardiovascular:     Rate and Rhythm: Normal rate and regular rhythm.     Heart sounds: Normal heart sounds. No murmur heard. Pulmonary:     Effort: Pulmonary effort is normal. No respiratory distress.     Breath sounds: Normal breath sounds. No wheezing.  Abdominal:     General: Bowel sounds are normal. There is no distension.     Palpations: Abdomen is soft.     Tenderness: There is no abdominal tenderness.  Musculoskeletal:        General: No tenderness. Normal range of motion.     Cervical back: Normal range of motion and neck supple.       Feet:  Skin:    General: Skin is warm and dry.     Findings: No erythema or rash.  Neurological:     Mental Status: He is alert and oriented to person, place, and time.     Cranial Nerves: No cranial nerve deficit.     Deep Tendon Reflexes: Reflexes are normal  and symmetric.  Psychiatric:        Behavior: Behavior normal.        Thought Content: Thought content normal.        Judgment: Judgment normal.     BP 125/61   Pulse 80   Temp 98.1 F (36.7 C) (Temporal)   Resp 18   Ht 5\' 9"  (1.753 m)   Wt 163 lb (73.9 kg)   SpO2 98%   BMI 24.07 kg/m    Cryotherapy used on bilateral feet. Pt tolerated well.     Assessment & Plan:   TABARI GOHL comes in today with chief complaint of Abdominal Pain (Lower Monday and Tuesday night. He did eat something that might of caused it. Patient stated he stopped his omeprazole because he thought that might been causing his PNA that he has had 3 times recently. And he stopped it 2 weeks ago.)   Diagnosis and orders addressed:  1. Generalized abdominal pain - famotidine (PEPCID) 20 MG  tablet; Take 1 tablet (20 mg total) by mouth 2 (two) times daily.  Dispense: 60 tablet; Refill: 2  2. Gastroesophageal reflux disease without esophagitis -Diet discussed- Avoid fried, spicy, citrus foods, caffeine and alcohol -Do not eat 2-3 hours before bedtime -Encouraged small frequent meals -Avoid NSAID's -Pt declines GI referral  Wants medication to take as needed Take Pepcid as needed or before eating spicy goods - famotidine (PEPCID) 20 MG tablet; Take 1 tablet (20 mg total) by mouth 2 (two) times daily.  Dispense: 60 tablet; Refill: 2  3. Plantar wart Avoid picking at    Follow up plan: Keep chronic follow up  Jannifer Rodney, FNP

## 2022-07-29 ENCOUNTER — Other Ambulatory Visit: Payer: Self-pay | Admitting: Family

## 2022-07-29 DIAGNOSIS — I10 Essential (primary) hypertension: Secondary | ICD-10-CM

## 2022-07-29 DIAGNOSIS — R600 Localized edema: Secondary | ICD-10-CM

## 2022-08-02 ENCOUNTER — Ambulatory Visit (INDEPENDENT_AMBULATORY_CARE_PROVIDER_SITE_OTHER): Payer: PPO | Admitting: *Deleted

## 2022-08-02 DIAGNOSIS — E1169 Type 2 diabetes mellitus with other specified complication: Secondary | ICD-10-CM

## 2022-08-02 DIAGNOSIS — I10 Essential (primary) hypertension: Secondary | ICD-10-CM

## 2022-08-02 NOTE — Patient Instructions (Signed)
Please call the care guide team at [phone] if you need to cancel or reschedule your appointment.   If you are experiencing a Mental Health or Behavioral Health Crisis or need someone to talk to, please call the Suicide and Crisis Lifeline: 988 call the [place] National Suicide Prevention Lifeline: 530-310-2264 or TTY: 386-853-9915 TTY 936-839-1811) to talk to a trained counselor call 1-800-273-TALK (toll free, 24 hour hotline) go to [facility] [address], [place] [phone]) call the [facility]: [phone] call 911   Following is a copy of the CCM Program Consent:  CCM service includes personalized support from designated clinical staff supervised by the physician, including individualized plan of care and coordination with other care providers 24/7 contact phone numbers for assistance for urgent and routine care needs. Service will only be billed when office clinical staff spend 20 minutes or more in a month to coordinate care. Only one practitioner may furnish and bill the service in a calendar month. The patient may stop CCM services at amy time (effective at the end of the month) by phone call to the office staff. The patient will be responsible for cost sharing (co-pay) or up to 20% of the service fee (after annual deductible is met)  Following is a copy of your full provider care plan:   Goals Addressed             This Visit's Progress    COMPLETED: CCM (HYPERLIPIDEMIA) EXPECTED OUTCOME: MONITOR, SELF-MANAGE AND REDUCE SYMPTOMS OF HYPERLIPIDEMIA       Current Barriers:  Knowledge Deficits related to management of Hyperlipidemia Chronic Disease Management support and education needs related to Hyperlipidemia and diet No Advanced Directives in place- pt declines information Patient reports he is independent with all aspects of his care, continues to drive, lives alone, does not follow a special diet, does not  exercise. Patient has diagnosis Pre-diabetes, is on jardiance, has glucometer but has not checked CBG, pt is contemplating and states he may decide to start checking, reports "pharmacist helped me and I know how" Update- patient reports today he still is not checking CBG and still has not made decision to do so. Patient is pleased that recent AIC on 04/23/22 is 6.0, no new concerns reported  Planned Interventions: Provider established cholesterol goals reviewed; Counseled on importance of regular laboratory monitoring as prescribed; Reviewed role and benefits of statin for ASCVD risk reduction; Reviewed importance of limiting foods high in cholesterol; Reviewed exercise goals and target of 150 minutes per week; Reviewed heart healthy diet  Reviewed plan of care with patient including case closure  Symptom Management: Take medications as prescribed   Attend all scheduled provider appointments Call pharmacy for medication refills 3-7 days in advance of running out of medications Perform all self care activities independently  Perform IADL's (shopping, preparing meals, housekeeping, managing finances) independently Call provider office for new concerns or questions  - call for medicine refill 2 or 3 days before it runs out - take all medications exactly as prescribed - call doctor with any symptoms you believe are related to your medicine - call doctor when you experience any new symptoms - go to all doctor appointments as scheduled - adhere to prescribed diet: heart healthy - develop an exercise routine Consider checking blood sugar Read food labels for sodium content, avoid/ limit fast food Case closure   Follow Up Plan: No further follow up required: Case closure  COMPLETED: CCM (HYPERTENSION) EXPECTED OUTCOME: MONITOR, SELF-MANAGE AND REDUCE SYMPTOMS OF HYPERTENSION       Current Barriers:  Knowledge Deficits related to management of Hypertension Chronic Disease  Management support and education needs related to Hypertension and diet No Advanced Directives in place- pt declines Patient reports he lives alone, has someone he can call on if needed for assistance, pt reports he does not check blood pressure and feels having checked at provider appointments is sufficient.  Pt reports he does not exercise, does not follow a special diet. Patient states overall he is doing well, states he has all medications and taking as prescribed. Patient reports he had "lung infection" and was on antibiotic and finished yesterday, states "I feel much better", no further lung issues reported Patient reports he is using miralax prn for constipation, states eating fruits and fiber, drinking adequate fluids and this is helping, has daily bowel movements but sometimes stool is hard, pt states he feels pepcid may be causing the constipation and is weaning himself off the medication, will talk with primary care provider if no improvement with constipation/ hard stools and plans to discuss alternative to pepcid if needed  Planned Interventions: Evaluation of current treatment plan related to hypertension self management and patient's adherence to plan as established by provider;   Reviewed medications with patient and discussed importance of compliance;  Counseled on the importance of exercise goals with target of 150 minutes per week Advised patient, providing education and rationale, to monitor blood pressure daily and record, calling PCP for findings outside established parameters;  Discussed complications of poorly controlled blood pressure such as heart disease, stroke, circulatory complications, vision complications, kidney impairment, sexual dysfunction;  Reviewed low sodium diet Reviewed importance of taking miralax as prescribed, eating adequate fiber in diet and drinking adequate fluids  Symptom Management: Take medications as prescribed   Attend all scheduled provider  appointments Call pharmacy for medication refills 3-7 days in advance of running out of medications Perform all self care activities independently  Perform IADL's (shopping, preparing meals, housekeeping, managing finances) independently Call provider office for new concerns or questions  check blood pressure weekly choose a place to take my blood pressure (home, clinic or office, retail store) write blood pressure results in a log or diary keep a blood pressure log take blood pressure log to all doctor appointments keep all doctor appointments take medications for blood pressure exactly as prescribed begin an exercise program report new symptoms to your doctor eat more whole grains, fruits and vegetables, lean meats and healthy fats Consider starting to check blood pressure at home and keeping a log Follow low sodium diet- read food labels, limit fast food Eat foods high in fiber such as raw vegetables and fruit, whole grain foods  Follow Up Plan: No further follow up required: Case closure             The patient verbalized understanding of instructions, educational materials, and care plan provided today and DECLINED offer to receive copy of patient instructions, educational materials, and care plan.  No further follow up required: Case closure

## 2022-08-02 NOTE — Chronic Care Management (AMB) (Signed)
Chronic Care Management   CCM RN Visit Note  08/02/2022 Name: Charles Beltran MRN: [id number] DOB: [birth year]  Subjective: Charles Beltran is a 72 y.o. year old male who is a primary care patient of Junie Spencer, FNP. The patient was referred to the Chronic Care Management team for assistance with care management needs subsequent to provider initiation of CCM services and plan of care.    Today's Visit:  Engaged with patient by telephone for follow up visit.        Goals Addressed             This Visit's Progress    COMPLETED: CCM (HYPERLIPIDEMIA) EXPECTED OUTCOME: MONITOR, SELF-MANAGE AND REDUCE SYMPTOMS OF HYPERLIPIDEMIA       Current Barriers:  Knowledge Deficits related to management of Hyperlipidemia Chronic Disease Management support and education needs related to Hyperlipidemia and diet No Advanced Directives in place- pt declines information Patient reports he is independent with all aspects of his care, continues to drive, lives alone, does not follow a special diet, does not exercise. Patient has diagnosis Pre-diabetes, is on jardiance, has glucometer but has not checked CBG, pt is contemplating and states he may decide to start checking, reports "pharmacist helped me and I know how" Update- patient reports today he still is not checking CBG and still has not made decision to do so. Patient is pleased that recent AIC on 04/23/22 is 6.0, no new concerns reported  Planned Interventions: Provider established cholesterol goals reviewed; Counseled on importance of regular laboratory monitoring as prescribed; Reviewed role and benefits of statin for ASCVD risk reduction; Reviewed importance of limiting foods high in cholesterol; Reviewed exercise goals and target of 150 minutes per week; Reviewed heart healthy diet  Reviewed plan of care with patient including case closure  Symptom Management: Take medications as prescribed   Attend all scheduled provider appointments Call  pharmacy for medication refills 3-7 days in advance of running out of medications Perform all self care activities independently  Perform IADL's (shopping, preparing meals, housekeeping, managing finances) independently Call provider office for new concerns or questions  - call for medicine refill 2 or 3 days before it runs out - take all medications exactly as prescribed - call doctor with any symptoms you believe are related to your medicine - call doctor when you experience any new symptoms - go to all doctor appointments as scheduled - adhere to prescribed diet: heart healthy - develop an exercise routine Consider checking blood sugar Read food labels for sodium content, avoid/ limit fast food Case closure   Follow Up Plan: No further follow up required: Case closure          COMPLETED: CCM (HYPERTENSION) EXPECTED OUTCOME: MONITOR, SELF-MANAGE AND REDUCE SYMPTOMS OF HYPERTENSION       Current Barriers:  Knowledge Deficits related to management of Hypertension Chronic Disease Management support and education needs related to Hypertension and diet No Advanced Directives in place- pt declines Patient reports he lives alone, has someone he can call on if needed for assistance, pt reports he does not check blood pressure and feels having checked at provider appointments is sufficient.  Pt reports he does not exercise, does not follow a special diet. Patient states overall he is doing well, states he has all medications and taking as prescribed. Patient reports he had "lung infection" and was on antibiotic and finished yesterday, states "I feel much better", no further lung issues reported Patient reports he is using miralax prn  for constipation, states eating fruits and fiber, drinking adequate fluids and this is helping, has daily bowel movements but sometimes stool is hard, pt states he feels pepcid may be causing the constipation and is weaning himself off the medication, will talk with  primary care provider if no improvement with constipation/ hard stools and plans to discuss alternative to pepcid if needed  Planned Interventions: Evaluation of current treatment plan related to hypertension self management and patient's adherence to plan as established by provider;   Reviewed medications with patient and discussed importance of compliance;  Counseled on the importance of exercise goals with target of 150 minutes per week Advised patient, providing education and rationale, to monitor blood pressure daily and record, calling PCP for findings outside established parameters;  Discussed complications of poorly controlled blood pressure such as heart disease, stroke, circulatory complications, vision complications, kidney impairment, sexual dysfunction;  Reviewed low sodium diet Reviewed importance of taking miralax as prescribed, eating adequate fiber in diet and drinking adequate fluids  Symptom Management: Take medications as prescribed   Attend all scheduled provider appointments Call pharmacy for medication refills 3-7 days in advance of running out of medications Perform all self care activities independently  Perform IADL's (shopping, preparing meals, housekeeping, managing finances) independently Call provider office for new concerns or questions  check blood pressure weekly choose a place to take my blood pressure (home, clinic or office, retail store) write blood pressure results in a log or diary keep a blood pressure log take blood pressure log to all doctor appointments keep all doctor appointments take medications for blood pressure exactly as prescribed begin an exercise program report new symptoms to your doctor eat more whole grains, fruits and vegetables, lean meats and healthy fats Consider starting to check blood pressure at home and keeping a log Follow low sodium diet- read food labels, limit fast food Eat foods high in fiber such as raw vegetables and  fruit, whole grain foods  Follow Up Plan: No further follow up required: Case closure             Plan:No further follow up required: Case closure  Irving Shows [facility], BSN RN Case Manager [facility] [phone]

## 2022-08-05 DIAGNOSIS — I10 Essential (primary) hypertension: Secondary | ICD-10-CM

## 2022-08-05 DIAGNOSIS — E785 Hyperlipidemia, unspecified: Secondary | ICD-10-CM

## 2022-08-06 ENCOUNTER — Other Ambulatory Visit: Payer: Self-pay | Admitting: Family

## 2022-08-07 ENCOUNTER — Ambulatory Visit (INDEPENDENT_AMBULATORY_CARE_PROVIDER_SITE_OTHER): Payer: PPO

## 2022-08-07 VITALS — Ht 69.0 in | Wt 163.0 lb

## 2022-08-07 DIAGNOSIS — Z0000 Encounter for general adult medical examination without abnormal findings: Secondary | ICD-10-CM

## 2022-08-07 NOTE — Progress Notes (Signed)
Subjective:   Charles Beltran is a 72 y.o. male who presents for Medicare Annual/Subsequent preventive examination.  Visit Complete: Virtual  I connected with  [name] on 08/07/22 by a audio enabled telemedicine application and verified that I am speaking with the correct person using two identifiers.  Patient Location: Home  Provider Location: Home Office  I discussed the limitations of evaluation and management by telemedicine. The patient expressed understanding and agreed to proceed.  Patient Medicare AWV questionnaire was completed by the patient on 08/07/2022; I have confirmed that all information answered by patient is correct and no changes since this date.  Review of Systems    Nutrition Risk Assessment:  Has the patient had any N/V/D within the last 2 months?  No  Does the patient have any non-healing wounds?  No  Has the patient had any unintentional weight loss or weight gain?  No   Diabetes:  Is the patient diabetic?  Yes  If diabetic, was a CBG obtained today?  No  Did the patient bring in their glucometer from home?  No  How often do you monitor your CBG's? Daily .   Financial Strains and Diabetes Management:  Are you having any financial strains with the device, your supplies or your medication? No .  Does the patient want to be seen by Chronic Care Management for management of their diabetes?  No  Would the patient like to be referred to a Nutritionist or for Diabetic Management?  No   Diabetic Exams:  Diabetic Eye Exam: Completed 06/2022 Diabetic Foot Exam: Overdue, Pt has been advised about the importance in completing this exam. Pt is scheduled for diabetic foot exam on next office visit .  Cardiac Risk Factors include: advanced age (>56men, >34 women);diabetes mellitus;dyslipidemia;male gender;hypertension     Objective:    Today's Vitals   08/07/22 0951  Weight: 163 lb (73.9 kg)  Height: 5\' 9"  (1.753 m)   Body mass index is 24.07  kg/m.     08/07/2022    9:54 AM 12/20/2021    2:52 PM 08/03/2021    9:51 AM 08/02/2020   10:44 AM 07/27/2020    4:37 PM 07/27/2019   10:34 AM 05/15/2019   12:01 PM  Advanced Directives  Does Patient Have a Medical Advance Directive? No No No No No No No  Would patient like information on creating a medical advance directive? No - Patient declined No - Patient declined Yes (MAU/Ambulatory/Procedural Areas - Information given) No - Patient declined  No - Patient declined     Current Medications (verified) Outpatient Encounter Medications as of 08/07/2022  Medication Sig   amLODipine (NORVASC) 10 MG tablet TAKE 1 TABLET BY MOUTH EVERY DAY   atorvastatin (LIPITOR) 20 MG tablet Take 1 tablet (20 mg total) by mouth daily.   Blood Glucose Monitoring Suppl (ONETOUCH VERIO FLEX SYSTEM) w/Device KIT Use to test blood sugar daily as directed; DX:E11.9   Cholecalciferol (VITAMIN D) 2000 UNITS tablet Take 2,000 Units by mouth daily.   empagliflozin (JARDIANCE) 10 MG TABS tablet Take 1 tablet (10 mg total) by mouth daily before breakfast.   famotidine (PEPCID) 20 MG tablet Take 1 tablet (20 mg total) by mouth 2 (two) times daily.   fish oil-omega-3 fatty acids 1000 MG capsule Take 1 g by mouth. One bid   glucose blood (ONETOUCH VERIO) test strip Use as instructed to test blood sugar daily. DX: E11.9   hydrochlorothiazide (MICROZIDE) 12.5 MG capsule TAKE 1  CAPSULE BY MOUTH EVERY DAY   lisinopril (ZESTRIL) 10 MG tablet TAKE 1 TABLET BY MOUTH EVERY DAY   omeprazole (PRILOSEC) 20 MG capsule Take 1 capsule (20 mg total) by mouth daily.   OneTouch Delica Lancets 33G MISC Use to test blood sugar daily as directed; DX:E11.9   No facility-administered encounter medications on file as of 08/07/2022.    Allergies (verified) Penicillins and Metformin and related   History: Past Medical History:  Diagnosis Date   Childhood asthma    Hyperlipidemia    Hypertension    History reviewed. No pertinent surgical  history. Family History  Problem Relation Age of Onset   Epilepsy Mother    Throat cancer Father    Alcohol abuse Father    Heart attack Brother    Stroke Brother    Social History   Socioeconomic History   Marital status: Single    Spouse name: Not on file   Number of children: 0   Years of education: 16   Highest education level: Bachelor's degree (e.g., BA, AB, BS)  Occupational History   Occupation: Retired  Tobacco Use   Smoking status: Never   Smokeless tobacco: Never  Vaping Use   Vaping Use: Never used  Substance and Sexual Activity   Alcohol use: No   Drug use: No   Sexual activity: Not Currently  Other Topics Concern   Not on file  Social History Narrative   Lives alone in an apartment with an Engineer, structural. Brother lives nearby, but most of family passed or lives out of state. No children, never married.   Social Determinants of Health   Financial Resource Strain: Low Risk  (08/07/2022)   Overall Financial Resource Strain (CARDIA)    Difficulty of Paying Living Expenses: Not hard at all  Food Insecurity: No Food Insecurity (08/07/2022)   Hunger Vital Sign    Worried About Running Out of Food in the Last Year: Never true    Ran Out of Food in the Last Year: Never true  Transportation Needs: No Transportation Needs (08/07/2022)   PRAPARE - Administrator, Civil Service (Medical): No    Lack of Transportation (Non-Medical): No  Physical Activity: Inactive (08/07/2022)   Exercise Vital Sign    Days of Exercise per Week: 0 days    Minutes of Exercise per Session: 0 min  Stress: No Stress Concern Present (08/07/2022)   [employer] of Occupational Health - Occupational Stress Questionnaire    Feeling of Stress : Not at all  Social Connections: Socially Isolated (08/07/2022)   Social Connection and Isolation Panel [NHANES]    Frequency of Communication with Friends and Family: More than three times a week    Frequency of Social Gatherings with Friends and  Family: More than three times a week    Attends Religious Services: Never    Database administrator or Organizations: No    Attends Engineer, structural: Never    Marital Status: Never married    Tobacco Counseling Counseling given: Not Answered   Clinical Intake:  Pre-visit preparation completed: Yes  Pain : No/denies pain     Nutritional Risks: None Diabetes: Yes CBG done?: No Did pt. bring in CBG monitor from home?: No  How often do you need to have someone help you when you read instructions, pamphlets, or other written materials from your doctor or pharmacy?: 1 - Never  Interpreter Needed?: No  Information entered by :: Renie Ora, LPN  Activities of Daily Living    08/07/2022    9:54 AM 12/20/2021    2:51 PM  In your present state of health, do you have any difficulty performing the following activities:  Hearing? 0 0  Vision? 0 0  Difficulty concentrating or making decisions? 0 0  Walking or climbing stairs? 0 0  Dressing or bathing? 0 0  Doing errands, shopping? 0 0  Preparing Food and eating ? N N  Using the Toilet? N N  In the past six months, have you accidently leaked urine? N N  Do you have problems with loss of bowel control? N N  Managing your Medications? N N  Managing your Finances? N N  Housekeeping or managing your Housekeeping? N N    Patient Care Team: Junie Spencer, FNP as PCP - General (Family Medicine) Delora Fuel, OD (Optometry) Cresenciano Genre Lilla Shook, [facility] as Pharmacist (Family Medicine)  Indicate any recent Medical Services you may have received from other than [facility] providers in the past year (date may be approximate).     Assessment:   This is a routine wellness examination for Masiyah.  Hearing/Vision screen Vision Screening - Comments:: Wears rx glasses - up to date with routine eye exams with  Dr.Johnson   Dietary issues and exercise activities discussed:     Goals Addressed             This Visit's  Progress    DIET - INCREASE WATER INTAKE   On track    Try to drink 6-8 glasses of water daily       Depression Screen    08/07/2022    9:53 AM 07/05/2022   11:09 AM 05/21/2022    3:34 PM 04/26/2022    8:55 AM 12/20/2021    2:39 PM 10/23/2021    8:55 AM 08/03/2021    9:50 AM  PHQ 2/9 Scores  PHQ - 2 Score 0 0 0 0 0 0 0  PHQ- 9 Score   0 0       Fall Risk    08/07/2022    9:51 AM 07/05/2022   11:09 AM 05/21/2022    3:34 PM 04/26/2022    8:54 AM 04/26/2022    8:51 AM  Fall Risk   Falls in the past year? 0 0 0 0 0  Number falls in past yr: 0 0  0   Injury with Fall? 0 0  0   Risk for fall due to : No Fall Risks History of fall(s)     Follow up Falls prevention discussed Falls evaluation completed;Education provided       MEDICARE RISK AT HOME:  Medicare Risk at Home - 08/07/22 0952     Any stairs in or around the home? No    If so, are there any without handrails? No    Home free of loose throw rugs in walkways, pet beds, electrical cords, etc? Yes    Adequate lighting in your home to reduce risk of falls? Yes    Life alert? No    Use of a cane, walker or w/c? No    Grab bars in the bathroom? Yes    Shower chair or bench in shower? Yes    Elevated toilet seat or a handicapped toilet? Yes             TIMED UP AND GO:  Was the test performed?  No    Cognitive Function:  08/07/2022    9:54 AM 08/03/2021    9:52 AM 07/27/2019   10:35 AM 07/23/2018   10:23 AM  6CIT Screen  What Year? 0 points 0 points 0 points 0 points  What month? 0 points 0 points 0 points 0 points  What time? 0 points 0 points 0 points 0 points  Count back from 20 0 points 0 points 0 points 0 points  Months in reverse 0 points 0 points 0 points 0 points  Repeat phrase 0 points 0 points 0 points 0 points  Total Score 0 points 0 points 0 points 0 points    Immunizations Immunization History  Administered Date(s) Administered   DTaP 02/05/2001   Fluad Quad(high Dose 65+) 10/11/2021    Influenza Whole 10/26/2009   Influenza, High Dose Seasonal PF 10/04/2016, 10/21/2018, 10/19/2019   Influenza, Seasonal, Injecte, Preservative Fre 01/21/2009, 10/26/2009, 10/10/2010, 10/26/2011   Influenza,inj,Quad PF,6+ Mos 10/31/2012, 11/03/2013, 09/19/2015   Influenza-Unspecified 10/31/2012, 11/03/2013, 10/27/2014, 10/19/2019   Moderna Sars-Covid-2 Vaccination 05/12/2019, 06/09/2019, 12/15/2019, 05/17/2020, 10/25/2020   Pneumococcal Conjugate-13 02/10/2013   Pneumococcal Polysaccharide-23 01/24/2017   Respiratory Syncytial Virus Vaccine,Recomb Aduvanted(Arexvy) 11/07/2021   Tdap 12/14/2011, 04/26/2022   Zoster Recombinant(Shingrix) 04/17/2021, 07/20/2021   Zoster, Live 08/18/2015    TDAP status: Up to date  Flu Vaccine status: Up to date  Pneumococcal vaccine status: Up to date  Covid-19 vaccine status: Completed vaccines  Qualifies for Shingles Vaccine? Yes   Zostavax completed Yes   Shingrix Completed?: Yes  Screening Tests Health Maintenance  Topic Date Due   OPHTHALMOLOGY EXAM  Never done   COVID-19 Vaccine (6 - 2023-24 season) 10/06/2021   Fecal DNA (Cologuard)  10/24/2022 (Originally [birth year]1996)   INFLUENZA VACCINE  09/06/2022   HEMOGLOBIN A1C  10/24/2022   Diabetic kidney evaluation - eGFR measurement  04/23/2023   Diabetic kidney evaluation - Urine ACR  04/23/2023   FOOT EXAM  04/26/2023   Medicare Annual Wellness (AWV)  08/07/2023   DTaP/Tdap/Td (4 - Td or Tdap) 04/25/2032   Pneumonia Vaccine 43+ Years old  Completed   Hepatitis C Screening  Completed   Zoster Vaccines- Shingrix  Completed   HPV VACCINES  Aged Out   Colonoscopy  Discontinued    Health Maintenance  Health Maintenance Due  Topic Date Due   OPHTHALMOLOGY EXAM  Never done   COVID-19 Vaccine (6 - 2023-24 season) 10/06/2021    Colorectal cancer screening: Referral to GI placed declined . Pt aware the office will call re: appt.  Lung Cancer Screening: (Low Dose CT Chest recommended if Age  45-80 years, 20 pack-year currently smoking OR have quit w/in 15years.) does not qualify.   Lung Cancer Screening Referral: n/a  Additional Screening:  Hepatitis C Screening: does not qualify; Completed 10/05/2014  Vision Screening: Recommended annual ophthalmology exams for early detection of glaucoma and other disorders of the eye. Is the patient up to date with their annual eye exam?  Yes  Who is the provider or what is the name of the office in which the patient attends annual eye exams? Dr.Johnson  If pt is not established with a provider, would they like to be referred to a provider to establish care? No .   Dental Screening: Recommended annual dental exams for proper oral hygiene  Diabetic Foot Exam: Diabetic Foot Exam: Overdue, Pt has been advised about the importance in completing this exam. Pt is scheduled for diabetic foot exam on next office visit .  Community Resource Referral / Chronic Care  Management: CRR required this visit?  No   CCM required this visit?  No     Plan:     I have personally reviewed and noted the following in the patient's chart:   Medical and social history Use of alcohol, tobacco or illicit drugs  Current medications and supplements including opioid prescriptions. Patient is not currently taking opioid prescriptions. Functional ability and status Nutritional status Physical activity Advanced directives List of other physicians Hospitalizations, surgeries, and ER visits in previous 12 months Vitals Screenings to include cognitive, depression, and falls Referrals and appointments  In addition, I have reviewed and discussed with patient certain preventive protocols, quality metrics, and best practice recommendations. A written personalized care plan for preventive services as well as general preventive health recommendations were provided to patient.     Lorrene Reid, LPN   02/10/1094   After Visit Summary: (MyChart) Due to this being a  telephonic visit, the after visit summary with patients personalized plan was offered to patient via MyChart   Nurse Notes: none

## 2022-08-07 NOTE — Patient Instructions (Signed)
[name] , Thank you for taking time to come for your Medicare Wellness Visit. I appreciate your ongoing commitment to your health goals. Please review the following plan we discussed and let me know if I can assist you in the future.   These are the goals we discussed:  Goals       DIET - INCREASE WATER INTAKE      Try to drink 6-8 glasses of water daily      Exercise 150 min/wk Moderate Activity      Walking is a great option.      T2DM, HTN, HLD (pt-stated)      Current Barriers:  Unable to independently monitor therapeutic efficacy Unable to achieve control of T2DM, HTN  UNABLE TO TEST BLOOD SUGAR AT HOME  Pharmacist Clinical Goal(s):  patient will achieve adherence to monitoring guidelines and medication adherence to achieve therapeutic efficacy achieve ability to self administer medications as prescribed as evidenced by patient report through collaboration with PharmD and provider.   Interventions: 1:1 collaboration with Junie Spencer, FNP regarding development and update of comprehensive plan of care as evidenced by provider attestation and co-signature Inter-disciplinary care team collaboration (see longitudinal plan of care) Comprehensive medication review performed; medication list updated in electronic medical record  Diabetes: New goal. Uncontrolled--A1C 7.4% NEW ONSET T2DM; current treatment: jardiance 10mg  daily;  Current glucose readings:unable to test, current H[employer] insurance covers one touch verio flex Completed review and glucometer education  Comprehensive dietary information reviewed and printed for patient Discussed meal planning options and Plate method for healthy eating Avoid sugary drinks and desserts Incorporate balanced protein, non starchy veggies, 1 serving of carbohydrate with each meal Increase water intake Increase physical activity as able Current exercise: encouraged Reviewed cholesterol and blood pressure Continue current therapy Educated  on glucometer, jardiance, dietary  Patient Goals/Self-Care Activities patient will:  - take medications as prescribed as evidenced by patient report and record review check glucose fasting/3 times weekly, document, and provide at future appointments target a minimum of 150 minutes of moderate intensity exercise weekly engage in dietary modifications by FOLLOWING A HEART HEALTHY DIET/HEALTHY PLATE METHOD          This is a list of the screening recommended for you and due dates:  Health Maintenance  Topic Date Due   Eye exam for diabetics  Never done   COVID-19 Vaccine (6 - 2023-24 season) 10/06/2021   Cologuard (Stool DNA test)  10/24/2022*   Flu Shot  09/06/2022   Hemoglobin A1C  10/24/2022   Yearly kidney function blood test for diabetes  04/23/2023   Yearly kidney health urinalysis for diabetes  04/23/2023   Complete foot exam   04/26/2023   Medicare Annual Wellness Visit  08/07/2023   DTaP/Tdap/Td vaccine (4 - Td or Tdap) 04/25/2032   Pneumonia Vaccine  Completed   Hepatitis C Screening  Completed   Zoster (Shingles) Vaccine  Completed   HPV Vaccine  Aged Out   Colon Cancer Screening  Discontinued  *Topic was postponed. The date shown is not the original due date.    Advanced directives: Advance directive discussed with you today. I have provided a copy for you to complete at home and have notarized. Once this is complete please bring a copy in to our office so we can scan it into your chart.   Conditions/risks identified: Aim for 30 minutes of exercise or brisk walking, 6-8 glasses of water, and 5 servings of fruits and vegetables each day.  Next appointment: Follow up in one year for your annual wellness visit.   Preventive Care 71 Years and Older, Male  Preventive care refers to lifestyle choices and visits with your health care provider that can promote health and wellness. What does preventive care include? A yearly physical exam. This is also called an annual  well check. Dental exams once or twice a year. Routine eye exams. Ask your health care provider how often you should have your eyes checked. Personal lifestyle choices, including: Daily care of your teeth and gums. Regular physical activity. Eating a healthy diet. Avoiding tobacco and drug use. Limiting alcohol use. Practicing safe sex. Taking low doses of aspirin every day. Taking vitamin and mineral supplements as recommended by your health care provider. What happens during an annual well check? The services and screenings done by your health care provider during your annual well check will depend on your age, overall health, lifestyle risk factors, and family history of disease. Counseling  Your health care provider may ask you questions about your: Alcohol use. Tobacco use. Drug use. Emotional well-being. Home and relationship well-being. Sexual activity. Eating habits. History of falls. Memory and ability to understand (cognition). Work and work Astronomer. Screening  You may have the following tests or measurements: Height, weight, and BMI. Blood pressure. Lipid and cholesterol levels. These may be checked every 5 years, or more frequently if you are over 47 years old. Skin check. Lung cancer screening. You may have this screening every year starting at age 46 if you have a 30-pack-year history of smoking and currently smoke or have quit within the past 15 years. Fecal occult blood test (FOBT) of the stool. You may have this test every year starting at age 37. Flexible sigmoidoscopy or colonoscopy. You may have a sigmoidoscopy every 5 years or a colonoscopy every 10 years starting at age 19. Prostate cancer screening. Recommendations will vary depending on your family history and other risks. Hepatitis C blood test. Hepatitis B blood test. Sexually transmitted disease (STD) testing. Diabetes screening. This is done by checking your blood sugar (glucose) after you have  not eaten for a while (fasting). You may have this done every 1-3 years. Abdominal aortic aneurysm (AAA) screening. You may need this if you are a current or former smoker. Osteoporosis. You may be screened starting at age 24 if you are at high risk. Talk with your health care provider about your test results, treatment options, and if necessary, the need for more tests. Vaccines  Your health care provider may recommend certain vaccines, such as: Influenza vaccine. This is recommended every year. Tetanus, diphtheria, and acellular pertussis (Tdap, Td) vaccine. You may need a Td booster every 10 years. Zoster vaccine. You may need this after age 63. Pneumococcal 13-valent conjugate (PCV13) vaccine. One dose is recommended after age 26. Pneumococcal polysaccharide (PPSV23) vaccine. One dose is recommended after age 28. Talk to your health care provider about which screenings and vaccines you need and how often you need them. This information is not intended to replace advice given to you by your health care provider. Make sure you discuss any questions you have with your health care provider. Document Released: 02/18/2015 Document Revised: 10/12/2015 Document Reviewed: 11/23/2014 Elsevier Interactive Patient Education  2017 [employer].  Fall Prevention in the Home Falls can cause injuries. They can happen to people of all ages. There are many things you can do to make your home safe and to help prevent falls. What can I  do on the outside of my home? Regularly fix the edges of walkways and driveways and fix any cracks. Remove anything that might make you trip as you walk through a door, such as a raised step or threshold. Trim any bushes or trees on the path to your home. Use bright outdoor lighting. Clear any walking paths of anything that might make someone trip, such as rocks or tools. Regularly check to see if handrails are loose or broken. Make sure that both sides of any steps have  handrails. Any raised decks and porches should have guardrails on the edges. Have any leaves, snow, or ice cleared regularly. Use sand or salt on walking paths during winter. Clean up any spills in your garage right away. This includes oil or grease spills. What can I do in the bathroom? Use night lights. Install grab bars by the toilet and in the tub and shower. Do not use towel bars as grab bars. Use non-skid mats or decals in the tub or shower. If you need to sit down in the shower, use a plastic, non-slip stool. Keep the floor dry. Clean up any water that spills on the floor as soon as it happens. Remove soap buildup in the tub or shower regularly. Attach bath mats securely with double-sided non-slip rug tape. Do not have throw rugs and other things on the floor that can make you trip. What can I do in the bedroom? Use night lights. Make sure that you have a light by your bed that is easy to reach. Do not use any sheets or blankets that are too big for your bed. They should not hang down onto the floor. Have a firm chair that has side arms. You can use this for support while you get dressed. Do not have throw rugs and other things on the floor that can make you trip. What can I do in the kitchen? Clean up any spills right away. Avoid walking on wet floors. Keep items that you use a lot in easy-to-reach places. If you need to reach something above you, use a strong step stool that has a grab bar. Keep electrical cords out of the way. Do not use floor polish or wax that makes floors slippery. If you must use wax, use non-skid floor wax. Do not have throw rugs and other things on the floor that can make you trip. What can I do with my stairs? Do not leave any items on the stairs. Make sure that there are handrails on both sides of the stairs and use them. Fix handrails that are broken or loose. Make sure that handrails are as long as the stairways. Check any carpeting to make sure  that it is firmly attached to the stairs. Fix any carpet that is loose or worn. Avoid having throw rugs at the top or bottom of the stairs. If you do have throw rugs, attach them to the floor with carpet tape. Make sure that you have a light switch at the top of the stairs and the bottom of the stairs. If you do not have them, ask someone to add them for you. What else can I do to help prevent falls? Wear shoes that: Do not have high heels. Have rubber bottoms. Are comfortable and fit you well. Are closed at the toe. Do not wear sandals. If you use a stepladder: Make sure that it is fully opened. Do not climb a closed stepladder. Make sure that both sides of the stepladder  are locked into place. Ask someone to hold it for you, if possible. Clearly mark and make sure that you can see: Any grab bars or handrails. First and last steps. Where the edge of each step is. Use tools that help you move around (mobility aids) if they are needed. These include: Canes. Walkers. Scooters. Crutches. Turn on the lights when you go into a dark area. Replace any light bulbs as soon as they burn out. Set up your furniture so you have a clear path. Avoid moving your furniture around. If any of your floors are uneven, fix them. If there are any pets around you, be aware of where they are. Review your medicines with your doctor. Some medicines can make you feel dizzy. This can increase your chance of falling. Ask your doctor what other things that you can do to help prevent falls. This information is not intended to replace advice given to you by your health care provider. Make sure you discuss any questions you have with your health care provider. Document Released: 11/18/2008 Document Revised: 06/30/2015 Document Reviewed: 02/26/2014 Elsevier Interactive Patient Education  2017 [employer].

## 2022-08-30 ENCOUNTER — Other Ambulatory Visit: Payer: Self-pay | Admitting: Family

## 2022-10-04 ENCOUNTER — Ambulatory Visit: Payer: PPO | Admitting: Family Medicine

## 2022-10-04 ENCOUNTER — Ambulatory Visit (INDEPENDENT_AMBULATORY_CARE_PROVIDER_SITE_OTHER): Payer: PPO

## 2022-10-04 ENCOUNTER — Encounter: Payer: Self-pay | Admitting: Family Medicine

## 2022-10-04 VITALS — BP 135/66 | HR 77 | Temp 98.2°F | Ht 69.0 in | Wt 170.2 lb

## 2022-10-04 DIAGNOSIS — R197 Diarrhea, unspecified: Secondary | ICD-10-CM | POA: Diagnosis not present

## 2022-10-04 DIAGNOSIS — R1084 Generalized abdominal pain: Secondary | ICD-10-CM | POA: Diagnosis not present

## 2022-10-04 DIAGNOSIS — R109 Unspecified abdominal pain: Secondary | ICD-10-CM | POA: Diagnosis not present

## 2022-10-04 DIAGNOSIS — K5900 Constipation, unspecified: Secondary | ICD-10-CM

## 2022-10-04 LAB — URINALYSIS, ROUTINE W REFLEX MICROSCOPIC
Bilirubin, UA: NEGATIVE
Ketones, UA: NEGATIVE
Leukocytes,UA: NEGATIVE
Nitrite, UA: NEGATIVE
Protein,UA: NEGATIVE
RBC, UA: NEGATIVE
Specific Gravity, UA: 1.01 (ref 1.005–1.030)
Urobilinogen, Ur: 0.2 mg/dL (ref 0.2–1.0)
pH, UA: 7 (ref 5.0–7.5)

## 2022-10-04 MED ORDER — POLYETHYLENE GLYCOL 3350 17 GM/SCOOP PO POWD
17.0000 g | Freq: Every day | ORAL | 1 refills | Status: DC
Start: 2022-10-04 — End: 2022-10-25

## 2022-10-04 NOTE — Progress Notes (Signed)
Subjective:  Patient ID: Charles Beltran, male    DOB: 1950/12/24, 72 y.o.   MRN: [id number]  Patient Care Team: Junie Spencer, FNP as PCP - General (Family Medicine) Delora Fuel, OD (Optometry) Cresenciano Genre Lilla Shook, [facility] as Pharmacist (Family Medicine)   Chief Complaint:  Abdominal Pain (On and off the few weeks.  States it was worse this morning. ) and Diarrhea (On and off the last few weeks. )   HPI: Charles Beltran is a 72 y.o. male presenting on 10/04/2022 for Abdominal Pain (On and off the few weeks.  States it was worse this morning. ) and Diarrhea (On and off the last few weeks. )   Pt presents today for intermittent abdominal pain for the last several weeks. Has had some bouts of diarrhea. States the took Miralax today and this made his stomach cramping worse.   Abdominal Pain This is a recurrent problem. The current episode started 1 to 4 weeks ago. The problem occurs intermittently. The problem has been waxing and waning. The pain is located in the generalized abdominal region. The pain is moderate. The quality of the pain is colicky and cramping. Associated symptoms include constipation and diarrhea. Pertinent negatives include no anorexia, arthralgias, belching, dysuria, fever, flatus, frequency, headaches, hematochezia, hematuria, melena, myalgias, nausea, vomiting or weight loss. Nothing aggravates the pain. The pain is relieved by Nothing. Treatments tried: Miralax. The treatment provided no relief.  Diarrhea  The current episode started 1 to 4 weeks ago. The problem has been waxing and waning. Associated symptoms include abdominal pain. Pertinent negatives include no arthralgias, bloating, chills, coughing, fever, headaches, increased  flatus, myalgias, sweats, URI, vomiting or weight loss. Nothing aggravates the symptoms. He has tried increased fluids for the symptoms. The treatment provided no relief.    Relevant past medical, surgical, family, and social history reviewed and  updated as indicated.  Allergies and medications reviewed and updated. Data reviewed: Chart in Epic.   Past Medical History:  Diagnosis Date   Childhood asthma    Hyperlipidemia    Hypertension     History reviewed. No pertinent surgical history.  Social History   Socioeconomic History   Marital status: Single    Spouse name: Not on file   Number of children: 0   Years of education: 16   Highest education level: Bachelor's degree (e.g., BA, AB, BS)  Occupational History   Occupation: Retired  Tobacco Use   Smoking status: Never   Smokeless tobacco: Never  Vaping Use   Vaping status: Never Used  Substance and Sexual Activity   Alcohol use: No   Drug use: No   Sexual activity: Not Currently  Other Topics Concern   Not on file  Social History Narrative   Lives alone in an apartment with an Engineer, structural. Brother lives nearby, but most of family passed or lives out of state. No children, never married.   Social Determinants of Health   Financial Resource Strain: Low Risk  (08/07/2022)   Overall Financial Resource Strain (CARDIA)    Difficulty of Paying Living Expenses: Not hard at all  Food Insecurity: No Food Insecurity (08/07/2022)   Hunger Vital Sign    Worried About Running Out of Food in the Last Year: Never true    Ran Out of Food in the Last Year: Never true  Transportation Needs: No Transportation Needs (08/07/2022)   PRAPARE - Transportation    Lack of Transportation (Medical): No    Lack of  Transportation (Non-Medical): No  Physical Activity: Inactive (08/07/2022)   Exercise Vital Sign    Days of Exercise per Week: 0 days    Minutes of Exercise per Session: 0 min  Stress: No Stress Concern Present (08/07/2022)   [employer] of Occupational Health - Occupational Stress Questionnaire    Feeling of Stress : Not at all  Social Connections: Socially Isolated (08/07/2022)   Social Connection and Isolation Panel [NHANES]    Frequency of Communication with Friends and  Family: More than three times a week    Frequency of Social Gatherings with Friends and Family: More than three times a week    Attends Religious Services: Never    Database administrator or Organizations: No    Attends Banker Meetings: Never    Marital Status: Never married  Intimate Partner Violence: Not At Risk (08/07/2022)   Humiliation, Afraid, Rape, and Kick questionnaire    Fear of Current or Ex-Partner: No    Emotionally Abused: No    Physically Abused: No    Sexually Abused: No    Outpatient Encounter Medications as of 10/04/2022  Medication Sig   amLODipine (NORVASC) 10 MG tablet TAKE 1 TABLET BY MOUTH EVERY DAY   atorvastatin (LIPITOR) 20 MG tablet TAKE 1 TABLET BY MOUTH EVERY DAY   Blood Glucose Monitoring Suppl (ONETOUCH VERIO FLEX SYSTEM) w/Device KIT Use to test blood sugar daily as directed; DX:E11.9   Cholecalciferol (VITAMIN D) 2000 UNITS tablet Take 2,000 Units by mouth daily.   empagliflozin (JARDIANCE) 10 MG TABS tablet Take 1 tablet (10 mg total) by mouth daily before breakfast.   famotidine (PEPCID) 20 MG tablet Take 1 tablet (20 mg total) by mouth 2 (two) times daily.   fish oil-omega-3 fatty acids 1000 MG capsule Take 1 g by mouth. One bid   glucose blood (ONETOUCH VERIO) test strip Use as instructed to test blood sugar daily. DX: E11.9   hydrochlorothiazide (MICROZIDE) 12.5 MG capsule TAKE 1 CAPSULE BY MOUTH EVERY DAY   lisinopril (ZESTRIL) 10 MG tablet TAKE 1 TABLET BY MOUTH EVERY DAY   omeprazole (PRILOSEC) 20 MG capsule Take 1 capsule (20 mg total) by mouth daily.   OneTouch Delica Lancets 33G MISC Use to test blood sugar daily as directed; DX:E11.9   polyethylene glycol powder (GLYCOLAX/MIRALAX) 17 GM/SCOOP powder Take 17 g by mouth daily.   No facility-administered encounter medications on file as of 10/04/2022.    Allergies  Allergen Reactions   Penicillins Shortness Of Breath   Metformin And Related Diarrhea    Review of Systems   Constitutional:  Negative for activity change, appetite change, chills, diaphoresis, fatigue, fever, unexpected weight change and weight loss.  Respiratory:  Negative for apnea, cough, choking, chest tightness, shortness of breath, wheezing and stridor.   Cardiovascular:  Negative for chest pain, palpitations and leg swelling.  Gastrointestinal:  Positive for abdominal pain, constipation and diarrhea. Negative for abdominal distention, anal bleeding, anorexia, bloating, blood in stool, flatus, hematochezia, melena, nausea, rectal pain and vomiting.  Genitourinary:  Negative for decreased urine volume, difficulty urinating, dysuria, frequency, hematuria and urgency.  Musculoskeletal:  Negative for arthralgias and myalgias.  Neurological:  Negative for dizziness, tremors, seizures, syncope, facial asymmetry, speech difficulty, weakness, light-headedness, numbness and headaches.  Psychiatric/Behavioral:  Negative for confusion.   All other systems reviewed and are negative.       Objective:  BP 135/66   Pulse 77   Temp 98.2 F (36.8 C) (Temporal)  Ht 5\' 9"  (1.753 m)   Wt 170 lb 3.2 oz (77.2 kg)   SpO2 95%   BMI 25.13 kg/m    Wt Readings from Last 3 Encounters:  10/04/22 170 lb 3.2 oz (77.2 kg)  08/07/22 163 lb (73.9 kg)  07/05/22 163 lb (73.9 kg)    Physical Exam Vitals and nursing note reviewed.  Constitutional:      General: He is not in acute distress.    Appearance: Normal appearance. He is well-developed and well-groomed. He is not ill-appearing, toxic-appearing or diaphoretic.  HENT:     Head: Normocephalic and atraumatic.     Jaw: There is normal jaw occlusion.     Right Ear: Hearing normal.     Left Ear: Hearing normal.     Nose: Nose normal.     Mouth/Throat:     Lips: Pink.     Mouth: Mucous membranes are moist.     Pharynx: Oropharynx is clear. Uvula midline.  Eyes:     General: Lids are normal.     Extraocular Movements: Extraocular movements intact.      Conjunctiva/sclera: Conjunctivae normal.     Pupils: Pupils are equal, round, and reactive to light.  Neck:     Thyroid: No thyroid mass, thyromegaly or thyroid tenderness.     Vascular: No carotid bruit or JVD.     Trachea: Trachea and phonation normal.  Cardiovascular:     Rate and Rhythm: Normal rate and regular rhythm.     Chest Wall: PMI is not displaced.     Pulses: Normal pulses.     Heart sounds: Normal heart sounds. No murmur heard.    No friction rub. No gallop.  Pulmonary:     Effort: Pulmonary effort is normal. No respiratory distress.     Breath sounds: Normal breath sounds. No wheezing.  Abdominal:     General: Bowel sounds are increased. There is no distension or abdominal bruit.     Palpations: Abdomen is soft. There is no hepatomegaly or splenomegaly.     Tenderness: There is no abdominal tenderness. There is no right CVA tenderness or left CVA tenderness.     Hernia: No hernia is present.  Musculoskeletal:        General: Normal range of motion.     Cervical back: Normal range of motion and neck supple.     Right lower leg: No edema.     Left lower leg: No edema.  Lymphadenopathy:     Cervical: No cervical adenopathy.  Skin:    General: Skin is warm and dry.     Capillary Refill: Capillary refill takes less than 2 seconds.     Coloration: Skin is not cyanotic, jaundiced or pale.     Findings: No rash.  Neurological:     General: No focal deficit present.     Mental Status: He is alert and oriented to person, place, and time.     Sensory: Sensation is intact.     Motor: Motor function is intact.     Coordination: Coordination is intact.     Gait: Gait is intact.     Deep Tendon Reflexes: Reflexes are normal and symmetric.  Psychiatric:        Attention and Perception: Attention and perception normal.        Mood and Affect: Mood and affect normal.        Speech: Speech normal.        Behavior: Behavior normal. Behavior is cooperative.  Thought  Content: Thought content normal.        Cognition and Memory: Cognition and memory normal.        Judgment: Judgment normal.     Results for orders placed or performed in visit on 08/17/22  HM DIABETES EYE EXAM  Result Value Ref Range   HM Diabetic Eye Exam No Retinopathy No Retinopathy     X-Ray: KUB: significant stool burden throughout colon. No acute findings. Preliminary x-ray reading by Kari Baars, FNP-C, [facility].   Pertinent labs & imaging results that were available during my care of the patient were reviewed by me and considered in my medical decision making.  Assessment & Plan:  Esmeralda was seen today for abdominal pain and diarrhea.  Diagnoses and all orders for this visit:  Generalized abdominal pain KUB with noted constipation. Urinalysis and blood work pending. Will treat with Miralax cleanout. Declined referral to GI.  -     DG Abd 1 View -     CMP14+EGFR -     CBC with Differential/Platelet -     Urinalysis, Routine w reflex microscopic  Diarrhea in adult patient Likely due to significant constipation which was noted on KUB. Will check labs for possible infectious process or electrolyte disturbances. Declined GI referral.  -     CMP14+EGFR -     CBC with Differential/Platelet  Constipation in male Miralax cleanout discussed in detail. Red flags discussed in detail. Aware to report any new, worsening, or persistent symptoms.  -     polyethylene glycol powder (GLYCOLAX/MIRALAX) 17 GM/SCOOP powder; Take 17 g by mouth daily.     Continue all other maintenance medications.  Follow up plan: Return if symptoms worsen or fail to improve.   Continue healthy lifestyle choices, including diet (rich in fruits, vegetables, and lean proteins, and low in salt and simple carbohydrates) and exercise (at least 30 minutes of moderate physical activity daily).  Educational handout given for abdominal pain and Miralax cleanout.   The above assessment and management plan was  discussed with the patient. The patient verbalized understanding of and has agreed to the management plan. Patient is aware to call the clinic if they develop any new symptoms or if symptoms persist or worsen. Patient is aware when to return to the clinic for a follow-up visit. Patient educated on when it is appropriate to go to the emergency department.   Kari Baars, FNP-C [facility] [phone]

## 2022-10-04 NOTE — Patient Instructions (Addendum)
Thank you for coming in to clinic today.  1. Your symptoms are consistent with Constipation, likely cause of your General Abdominal Pain / Cramping. 2. Start with Miralax, prescription was sent to pharmacy. First dose 68g (4 capfuls) in 32oz water over 1 to 2 hours for clean out. Next day start 17g or 1 capful daily, may adjust dose up or down by half a capful every few days. Recommend to take this medicine daily for next 1-2 weeks, you may need to use it longer if needed. - Goal is to have soft regular bowel movement 1-3x daily, if too runny or diarrhea, then reduce dose of the medicine to every other day.  Improve water intake, hydration will help Also recommend increased vegetables, fruits, fiber intake Can try daily Metamucil or Fiber supplement at pharmacy over the counter  Follow-up if symptoms are not improving with bowel movements, or if pain worsens, develop fevers, nausea, vomiting.  Please schedule a follow-up appointment with Michelle Rakes, FNP, in 1 month to follow-up Constipation  If you have any other questions or concerns, please feel free to call the clinic to contact me. You may also schedule an earlier appointment if necessary.  However, if your symptoms get significantly worse, please go to the Emergency Department to seek immediate medical attention.  

## 2022-10-05 ENCOUNTER — Emergency Department (HOSPITAL_COMMUNITY): Payer: PPO

## 2022-10-05 ENCOUNTER — Encounter (HOSPITAL_COMMUNITY): Payer: Self-pay

## 2022-10-05 ENCOUNTER — Emergency Department (HOSPITAL_COMMUNITY)
Admission: EM | Admit: 2022-10-05 | Discharge: 2022-10-05 | Disposition: A | Discharge status: Home / Self Care | Payer: PPO | Source: Home / Self Care

## 2022-10-05 ENCOUNTER — Other Ambulatory Visit: Payer: Self-pay

## 2022-10-05 DIAGNOSIS — K449 Diaphragmatic hernia without obstruction or gangrene: Secondary | ICD-10-CM | POA: Diagnosis not present

## 2022-10-05 DIAGNOSIS — N400 Enlarged prostate without lower urinary tract symptoms: Secondary | ICD-10-CM | POA: Diagnosis not present

## 2022-10-05 DIAGNOSIS — K859 Acute pancreatitis without necrosis or infection, unspecified: Secondary | ICD-10-CM

## 2022-10-05 DIAGNOSIS — R7401 Elevation of levels of liver transaminase levels: Secondary | ICD-10-CM | POA: Diagnosis not present

## 2022-10-05 DIAGNOSIS — K828 Other specified diseases of gallbladder: Secondary | ICD-10-CM | POA: Diagnosis not present

## 2022-10-05 DIAGNOSIS — R748 Abnormal levels of other serum enzymes: Secondary | ICD-10-CM

## 2022-10-05 DIAGNOSIS — K5900 Constipation, unspecified: Secondary | ICD-10-CM | POA: Diagnosis not present

## 2022-10-05 DIAGNOSIS — R109 Unspecified abdominal pain: Secondary | ICD-10-CM | POA: Diagnosis not present

## 2022-10-05 LAB — COMPREHENSIVE METABOLIC PANEL
ALT: 296 U/L — ABNORMAL HIGH (ref 0–44)
AST: 167 U/L — ABNORMAL HIGH (ref 15–41)
Albumin: 4.3 g/dL (ref 3.5–5.0)
Alkaline Phosphatase: 131 U/L — ABNORMAL HIGH (ref 38–126)
Anion gap: 8 (ref 5–15)
BUN: 19 mg/dL (ref 8–23)
CO2: 26 mmol/L (ref 22–32)
Calcium: 9.3 mg/dL (ref 8.9–10.3)
Chloride: 98 mmol/L (ref 98–111)
Creatinine, Ser: 0.88 mg/dL (ref 0.61–1.24)
GFR, Estimated: >60 mL/min (ref >60)
Glucose, Bld: 123 mg/dL — ABNORMAL HIGH (ref 70–99)
Potassium: 4.2 mmol/L (ref 3.5–5.1)
Sodium: 132 mmol/L — ABNORMAL LOW (ref 135–145)
Total Bilirubin: 1.7 mg/dL — ABNORMAL HIGH (ref 0.3–1.2)
Total Protein: 7.2 g/dL (ref 6.5–8.1)

## 2022-10-05 LAB — CBC WITH DIFFERENTIAL/PLATELET
Abs Immature Granulocytes: 0.02 10*3/uL (ref 0.00–0.07)
Basophils Absolute: 0 10*3/uL (ref 0.0–0.2)
Basophils Absolute: 0 10*3/uL (ref 0.0–0.1)
Basophils Relative: 0 %
Basos: 0 %
EOS (ABSOLUTE): 0.1 10*3/uL (ref 0.0–0.4)
Eos: 0 %
Eosinophils Absolute: 0.1 10*3/uL (ref 0.0–0.5)
Eosinophils Relative: 1 %
HCT: 47.1 % (ref 39.0–52.0)
Hematocrit: 45.6 % (ref 37.5–51.0)
Hemoglobin: 16 g/dL (ref 13.0–17.7)
Hemoglobin: 16.2 g/dL (ref 13.0–17.0)
Immature Grans (Abs): 0 10*3/uL (ref 0.0–0.1)
Immature Granulocytes: 0 %
Immature Granulocytes: 0 %
Lymphocytes Absolute: 0.8 10*3/uL (ref 0.7–3.1)
Lymphocytes Relative: 7 %
Lymphs Abs: 0.6 10*3/uL — ABNORMAL LOW (ref 0.7–4.0)
Lymphs: 5 %
MCH: 30.9 pg (ref 26.6–33.0)
MCH: 30.8 pg (ref 26.0–34.0)
MCHC: 35.1 g/dL (ref 31.5–35.7)
MCHC: 34.4 g/dL (ref 30.0–36.0)
MCV: 88 fL (ref 79–97)
MCV: 89.5 fL (ref 80.0–100.0)
Monocytes Absolute: 1.5 10*3/uL — ABNORMAL HIGH (ref 0.1–0.9)
Monocytes Absolute: 1.3 10*3/uL — ABNORMAL HIGH (ref 0.1–1.0)
Monocytes Relative: 14 %
Monocytes: 10 %
Neutro Abs: 6.9 10*3/uL (ref 1.7–7.7)
Neutrophils Absolute: 12.7 10*3/uL — ABNORMAL HIGH (ref 1.4–7.0)
Neutrophils Relative %: 78 %
Neutrophils: 85 %
Platelets: 190 10*3/uL (ref 150–450)
Platelets: 183 10*3/uL (ref 150–400)
RBC: 5.17 x10E6/uL (ref 4.14–5.80)
RBC: 5.26 MIL/uL (ref 4.22–5.81)
RDW: 12.5 % (ref 11.6–15.4)
RDW: 12.8 % (ref 11.5–15.5)
WBC: 15.1 10*3/uL — ABNORMAL HIGH (ref 3.4–10.8)
WBC: 9 10*3/uL (ref 4.0–10.5)
nRBC: 0 % (ref 0.0–0.2)

## 2022-10-05 LAB — CMP14+EGFR
ALT: 341 IU/L — ABNORMAL HIGH (ref 0–44)
AST: 417 IU/L — ABNORMAL HIGH (ref 0–40)
Albumin: 4.8 g/dL (ref 3.8–4.8)
Alkaline Phosphatase: 145 IU/L — ABNORMAL HIGH (ref 44–121)
BUN/Creatinine Ratio: 24 (ref 10–24)
BUN: 22 mg/dL (ref 8–27)
Bilirubin Total: 2.4 mg/dL — ABNORMAL HIGH (ref 0.0–1.2)
CO2: 21 mmol/L (ref 20–29)
Calcium: 9.3 mg/dL (ref 8.6–10.2)
Chloride: 95 mmol/L — ABNORMAL LOW (ref 96–106)
Creatinine, Ser: 0.92 mg/dL (ref 0.76–1.27)
Globulin, Total: 2 g/dL (ref 1.5–4.5)
Glucose: 172 mg/dL — ABNORMAL HIGH (ref 70–99)
Potassium: 4.2 mmol/L (ref 3.5–5.2)
Sodium: 133 mmol/L — ABNORMAL LOW (ref 134–144)
Total Protein: 6.8 g/dL (ref 6.0–8.5)
eGFR: 89 mL/min/{1.73_m2} (ref >59)

## 2022-10-05 LAB — LIPASE, BLOOD: Lipase: 294 U/L — ABNORMAL HIGH (ref 11–51)

## 2022-10-05 MED ORDER — IOHEXOL 300 MG/ML  SOLN
100.0000 mL | Freq: Once | INTRAMUSCULAR | Status: AC | PRN
  Administered 2022-10-05: 100 mL via INTRAVENOUS

## 2022-10-05 NOTE — Discharge Instructions (Addendum)
Please follow-up with your primary doctor soon as possible.  Please return immediately if you develop fevers, chills, chest pain, shortness of breath, abdominal pain, nausea, vomiting.  Also, please return if you develop any new or worsening symptoms that are concerning to you

## 2022-10-05 NOTE — ED Triage Notes (Signed)
Pt seen yesterday at PCP for constipation.  Pt stated he is still constipated, ABD pain 1/10 Denies N/V PCP sent to ED due to liver enzymes were elevated.

## 2022-10-05 NOTE — ED Provider Notes (Signed)
[place] EMERGENCY DEPARTMENT AT [facility] Provider Note   CSN: [id number] Arrival date & time: 10/05/22  1048     History  Chief Complaint  Patient presents with   Constipation    Charles Beltran is a 72 y.o. male.  Is a 72 year old male presenting emergency department with evaluation for elevated liver enzymes.  Reports he has been in his usual state of health, some slight constipation.  He had some very minor abdominal pain yesterday he reports largely around bellybutton/epigastrium.  It is seemingly resolved and he is not having any pain today.  No nausea no vomiting.  No fevers.  No chest pain no shortness of breath.  He went to his primary doctor who drew labs yesterday they noted that he had elevated liver enzymes.   Constipation      Home Medications Prior to Admission medications   Medication Sig Start Date End Date Taking? Authorizing Provider  amLODipine (NORVASC) 10 MG tablet TAKE 1 TABLET BY MOUTH EVERY DAY 08/06/22   Jannifer Rodney A, FNP  atorvastatin (LIPITOR) 20 MG tablet TAKE 1 TABLET BY MOUTH EVERY DAY 08/30/22   Jannifer Rodney A, FNP  Blood Glucose Monitoring Suppl (ONETOUCH VERIO FLEX SYSTEM) w/Device KIT Use to test blood sugar daily as directed; DX:E11.9 11/22/21   Jannifer Rodney A, FNP  Cholecalciferol (VITAMIN D) 2000 UNITS tablet Take 2,000 Units by mouth daily.    [provider]  empagliflozin (JARDIANCE) 10 MG TABS tablet Take 1 tablet (10 mg total) by mouth daily before breakfast. 04/26/22   Jannifer Rodney A, FNP  famotidine (PEPCID) 20 MG tablet Take 1 tablet (20 mg total) by mouth 2 (two) times daily. 07/05/22   Junie Spencer, FNP  fish oil-omega-3 fatty acids 1000 MG capsule Take 1 g by mouth. One bid    [provider]  glucose blood (ONETOUCH VERIO) test strip Use as instructed to test blood sugar daily. DX: E11.9 11/22/21   Jannifer Rodney A, FNP  hydrochlorothiazide (MICROZIDE) 12.5 MG capsule TAKE 1 CAPSULE BY MOUTH  EVERY DAY 07/30/22   Jannifer Rodney A, FNP  lisinopril (ZESTRIL) 10 MG tablet TAKE 1 TABLET BY MOUTH EVERY DAY 08/30/22   Jannifer Rodney A, FNP  omeprazole (PRILOSEC) 20 MG capsule Take 1 capsule (20 mg total) by mouth daily. 04/27/22   Junie Spencer, FNP  OneTouch Delica Lancets 33G MISC Use to test blood sugar daily as directed; DX:E11.9 11/22/21   Jannifer Rodney A, FNP  polyethylene glycol powder (GLYCOLAX/MIRALAX) 17 GM/SCOOP powder Take 17 g by mouth daily. 10/04/22   Sonny Masters, FNP      Allergies    Penicillins and Metformin and related    Review of Systems   Review of Systems  Gastrointestinal:  Positive for constipation.    Physical Exam Updated Vital Signs BP 133/67 (BP Location: Right Arm)   Pulse (!) 58   Temp 98.6 F (37 C)   Resp 16   Ht 5\' 9"  (1.753 m)   Wt 77.1 kg   SpO2 98%   BMI 25.10 kg/m  Physical Exam Vitals and nursing note reviewed.  Constitutional:      Appearance: He is not ill-appearing or toxic-appearing.  HENT:     Head: Normocephalic.     Nose: Nose normal.     Mouth/Throat:     Mouth: Mucous membranes are moist.  Eyes:     General: No scleral icterus.    Conjunctiva/sclera: Conjunctivae normal.  Cardiovascular:  Rate and Rhythm: Normal rate and regular rhythm.  Pulmonary:     Effort: Pulmonary effort is normal.     Breath sounds: Normal breath sounds.  Abdominal:     General: Abdomen is flat. There is no distension.     Palpations: Abdomen is soft.     Tenderness: There is no abdominal tenderness. There is no guarding or rebound.  Musculoskeletal:        General: Normal range of motion.  Skin:    General: Skin is warm and dry.     Capillary Refill: Capillary refill takes less than 2 seconds.  Neurological:     Mental Status: He is alert and oriented to person, place, and time.  Psychiatric:        Mood and Affect: Mood normal.        Behavior: Behavior normal.     ED Results / Procedures / Treatments   Labs (all labs  ordered are listed, but only abnormal results are displayed) Labs Reviewed  CBC WITH DIFFERENTIAL/PLATELET - Abnormal; Notable for the following components:      Result Value   Lymphs Abs 0.6 (*)    Monocytes Absolute 1.3 (*)    All other components within normal limits  COMPREHENSIVE METABOLIC PANEL - Abnormal; Notable for the following components:   Sodium 132 (*)    Glucose, Bld 123 (*)    AST 167 (*)    ALT 296 (*)    Alkaline Phosphatase 131 (*)    Total Bilirubin 1.7 (*)    All other components within normal limits  LIPASE, BLOOD - Abnormal; Notable for the following components:   Lipase 294 (*)    All other components within normal limits    EKG None  Radiology CT ABDOMEN PELVIS W CONTRAST  Result Date: 10/05/2022 CLINICAL DATA:  Abdominal pain, acute, nonlocalized EXAM: CT ABDOMEN AND PELVIS WITH CONTRAST TECHNIQUE: Multidetector CT imaging of the abdomen and pelvis was performed using the standard protocol following bolus administration of intravenous contrast. RADIATION DOSE REDUCTION: This exam was performed according to the departmental dose-optimization program which includes automated exposure control, adjustment of the mA and/or kV according to patient size and/or use of iterative reconstruction technique. CONTRAST:  OMNIPAQUE IOHEXOL 300 MG/ML  SOLN COMPARISON:  07/27/2020 FINDINGS: Lower chest: No acute abnormality.  Small hiatal hernia. Hepatobiliary: No focal hepatic abnormality. Gallbladder unremarkable. Pancreas: No focal abnormality or ductal dilatation. Slight haziness noted around the pancreatic head. Recommend clinical correlation to exclude early focal pancreatitis. Spleen: No focal abnormality.  Normal size. Adrenals/Urinary Tract: No adrenal abnormality. No focal renal abnormality. No stones or hydronephrosis. Urinary bladder is unremarkable. Stomach/Bowel: Stomach, large and small bowel grossly unremarkable. Vascular/Lymphatic: Aortic atherosclerosis. No  evidence of aneurysm or adenopathy. Reproductive: No visible focal abnormality. Mild enlargement of the prostate. Other: No free fluid or free air. Musculoskeletal: No acute bony abnormality. IMPRESSION: Slight haziness or med the pancreatic head which could reflect early focal acute pancreatitis. Recommend clinical correlation. Small hiatal hernia. Aortic atherosclerosis. Prostate enlargement. Electronically Signed   By: Charlett Nose M.D.   On: 10/05/2022 19:24   US Abdomen Limited RUQ (LIVER/GB)  Result Date: 10/05/2022 CLINICAL DATA:  Transaminitis, mid abdominal pain for several days EXAM: ULTRASOUND ABDOMEN LIMITED RIGHT UPPER QUADRANT COMPARISON:  10/05/2022, 07/27/2020 FINDINGS: Gallbladder: Gallbladder is moderately distended, with no evidence of shadowing gallstones. Prominent gallbladder wall thickening measuring up to 6 mm. No pericholecystic fluid. Negative sonographic Murphy sign. Common bile duct: Diameter: 3 mm  Liver: No focal lesion identified. Within normal limits in parenchymal echogenicity. Portal vein is patent on color Doppler imaging with normal direction of blood flow towards the liver. Other: None. IMPRESSION: 1. Gallbladder wall thickening. No other sonographic evidence of acute cholecystitis or underlying cholelithiasis. If acute cholecystitis is a clinical concern, further evaluation with nuclear medicine hepatobiliary scan may be useful. 2. Otherwise unremarkable right upper quadrant ultrasound. Electronically Signed   By: Sharlet Salina M.D.   On: 10/05/2022 17:57   DG Abd 1 View  Result Date: 10/05/2022 CLINICAL DATA:  Constipation, abdominal pain EXAM: ABDOMEN - 1 VIEW COMPARISON:  KUB 08/11/2020 FINDINGS: There is a moderate stool burden throughout the colon without evidence of obstruction. There is no free intraperitoneal air. There is no gross organomegaly or abnormal soft tissue calcification The imaged lung bases are clear. There is no acute osseous abnormality.  IMPRESSION: Moderate colonic stool burden without evidence of obstruction. Electronically Signed   By: Lesia Hausen M.D.   On: 10/05/2022 11:55   DG Abdomen 1 View  Result Date: 10/05/2022 CLINICAL DATA:  Constipation EXAM: ABDOMEN - 1 VIEW COMPARISON:  KUB 1 day prior FINDINGS: There is a moderate stool burden throughout the colon without evidence of obstruction. There is no free intraperitoneal air. There is no gross organomegaly or abnormal soft tissue calcification The imaged lung bases are clear. There is no acute osseous abnormality. IMPRESSION: Moderate colonic stool burden without evidence of obstruction. Electronically Signed   By: Lesia Hausen M.D.   On: 10/05/2022 11:53    Procedures Procedures    Medications Ordered in ED Medications  iohexol (OMNIPAQUE) 300 MG/ML solution 100 mL (100 mLs Intravenous Contrast Given 10/05/22 1849)    ED Course/ Medical Decision Making/ A&P Clinical Course as of 10/05/22 2034  Fri Oct 05, 2022  1631 CT abd in 22 per chart review "IMPRESSION: Edema in the second portion of the duodenum with significant Peri duodenal inflammatory changes at least consistent with duodenitis. Possibility of a focal duodenal ulcer could not be totally excluded although no perforation is identified at this time.   No other focal abnormality is noted. " [TY]  1631 Lipase(!): 294 Elevated. Appears new [TY]  1631 Comprehensive metabolic panel(!) Transaminitis.  [TY]  1801 US Abdomen Limited RUQ (LIVER/GB) IMPRESSION: 1. Gallbladder wall thickening. No other sonographic evidence of acute cholecystitis or underlying cholelithiasis. If acute cholecystitis is a clinical concern, further evaluation with nuclear medicine hepatobiliary scan may be useful. 2. Otherwise unremarkable right upper quadrant ultrasound   [TY]  1937 CT ABDOMEN PELVIS W CONTRAST IMPRESSION: Slight haziness or med the pancreatic head which could reflect early focal acute pancreatitis.  Recommend clinical correlation.  Small hiatal hernia.  Aortic atherosclerosis.  Prostate enlargement.   [TY]    Clinical Course User Index [TY] Coral Spikes, DO                                 Medical Decision Making 72 year old male presenting for evaluation of abdominal pain and elevated liver enzymes.  His abdominal pain was yesterday and is since resolved.  He is afebrile nontachycardic hemodynamically stable.  Physical exam with no abdominal tenderness.  He is having no nausea no vomiting.  Repeated labs showed his AST ALT is elevated, but is downtrending compared to yesterday.  Bilirubin similar pattern elevated with downtrending.  Does have an elevated lipase of 294.  Imaging obtained with mild  gallbladder wall thickening, no stones, no right upper quadrant tenderness on exam no leukocytosis fever or tachycardia to suggest infection or cholecystitis.  CT abdomen showed some minor evidence of pancreatitis which would correlate with his elevated lipase.  Given patient is asymptomatic, tolerating p.o. not having any pain and his labs are downtrending that he is appropriate for outpatient follow-up.  Patient is agreeable to this plan.  He was given strict return precautions and will return promptly if symptoms worsen.  Amount and/or Complexity of Data Reviewed External Data Reviewed: notes. Labs: ordered. Decision-making details documented in ED Course. Radiology: ordered. Decision-making details documented in ED Course.  Risk Prescription drug management.          Final Clinical Impression(s) / ED Diagnoses Final diagnoses:  Acute pancreatitis, unspecified complication status, unspecified pancreatitis type  Elevated liver enzymes    Rx / DC Orders ED Discharge Orders     None         Coral Spikes, DO 10/05/22 2034

## 2022-10-09 ENCOUNTER — Telehealth: Payer: Self-pay | Admitting: Family

## 2022-10-09 NOTE — Telephone Encounter (Signed)
Yes ok to continue Miralax.   Jannifer Rodney, FNP

## 2022-10-09 NOTE — Telephone Encounter (Signed)
Patient aware and verbalized understanding. °

## 2022-10-11 ENCOUNTER — Other Ambulatory Visit: Payer: Self-pay | Admitting: Family

## 2022-10-11 DIAGNOSIS — E1169 Type 2 diabetes mellitus with other specified complication: Secondary | ICD-10-CM

## 2022-10-12 ENCOUNTER — Ambulatory Visit (INDEPENDENT_AMBULATORY_CARE_PROVIDER_SITE_OTHER): Payer: PPO | Admitting: Family

## 2022-10-12 ENCOUNTER — Encounter: Payer: Self-pay | Admitting: Family

## 2022-10-12 VITALS — BP 113/54 | HR 67 | Temp 97.0°F | Ht 69.0 in | Wt 162.2 lb

## 2022-10-12 DIAGNOSIS — Z09 Encounter for follow-up examination after completed treatment for conditions other than malignant neoplasm: Secondary | ICD-10-CM

## 2022-10-12 DIAGNOSIS — K8500 Idiopathic acute pancreatitis without necrosis or infection: Secondary | ICD-10-CM

## 2022-10-12 DIAGNOSIS — R7303 Prediabetes: Secondary | ICD-10-CM

## 2022-10-12 DIAGNOSIS — K5900 Constipation, unspecified: Secondary | ICD-10-CM

## 2022-10-12 DIAGNOSIS — R748 Abnormal levels of other serum enzymes: Secondary | ICD-10-CM

## 2022-10-12 DIAGNOSIS — E785 Hyperlipidemia, unspecified: Secondary | ICD-10-CM | POA: Diagnosis not present

## 2022-10-12 DIAGNOSIS — E1169 Type 2 diabetes mellitus with other specified complication: Secondary | ICD-10-CM | POA: Diagnosis not present

## 2022-10-12 LAB — BAYER DCA HB A1C WAIVED: HB A1C (BAYER DCA - WAIVED): 5.9 % — ABNORMAL HIGH (ref 4.8–5.6)

## 2022-10-12 NOTE — Patient Instructions (Signed)
Acute Pancreatitis  Acute pancreatitis happens when a gland called the pancreas suddenly develops inflammation, making it irritated and swollen. The pancreas is found on the left side of the abdomen, behind the stomach. The pancreas makes proteins (enzymes) that help to digest food. It also releases the hormones glucagon and insulin. These help to regulate blood sugar. Most sudden (acute) attacks of this condition last a few days and can cause serious problems. Some people become dehydrated and develop low blood pressure. In severe cases, bleeding in the abdomen can lead to shock and can be life-threatening. The lungs, heart, and kidneys may stop working. What are the causes? This condition may be caused by: Heavy alcohol use. Drug use. Gallstones or other conditions that can block the tube that drains the pancreas (pancreatic duct). A tumor in the pancreas. Other causes include: Being exposed to certain medicines or certain chemicals. Having health conditions such as diabetes, high triglycerides, or high calcium levels in your blood. High calcium levels are usually caused by the parathyroid gland being too active. An infection in the pancreas. Damage caused by an accident (trauma) or by the poison (venom) of a scorpion sting. Abdominal surgery. Autoimmune pancreatitis. This is when the body's disease-fighting system (immune system) attacks the pancreas. Genes that are passed from parent to child (inherited). In some cases, the cause of this condition is not known. What are the signs or symptoms? Symptoms of this condition include: Pain in the upper abdomen that may spread (radiate) to the back. Pain may be severe and often worsens after you eat. A tender and swollen abdomen. Nausea and vomiting. Fever. How is this diagnosed? This condition may be diagnosed based on: A physical exam. Blood tests. These include an increased (elevated) level of lipase or amylase. Imaging tests, such as CT  scans, MRIs, or an ultrasound of the abdomen. How is this treated? Treatment for this condition often requires a hospital stay and may include: Pain medicine. IV fluids. Placing a tube in the stomach to remove stomach contents and to control vomiting (nasogastric tube, or NG tube). Not eating until vomiting has lessened. Treating any underlying conditions that may be the cause. Treatment may include: Antibiotic medicines, if your condition is caused by an infection. Steroid medicine, if your condition is caused by your immune system attacking your pancreas (autoimmune disease). Surgery on the gallbladder or pancreas, if your condition is caused by gallstones or another blockage. Follow these instructions at home: Medicines Take over-the-counter and prescription medicines only as told by your health care provider. If you were prescribed an antibiotic medicine, take it as told by your health care provider. Do not stop using the antibiotic even if you start to feel better. Ask your health care provider if the medicine prescribed to you: Requires you to avoid driving or using machinery. Can cause constipation. You may need to take these actions to prevent or treat constipation: Take over-the-counter or prescription medicines. Eat foods that are high in fiber, such as beans, whole grains, and fresh fruits and vegetables. Limit foods that are high in fat and processed sugars, such as fried or sweet foods. Eating and drinking  Follow instructions from your health care provider about diet. This may involve avoiding alcohol and having less fat in your diet. Eat smaller, more frequent meals. Doing this causes the pancreas to make less digestive fluid. Drink enough fluid to keep your urine pale yellow. Do not drink alcohol if it caused your condition. General instructions Do not use  any products that contain nicotine or tobacco. These products include cigarettes, chewing tobacco, and vaping devices,  such as e-cigarettes. If you need help quitting, ask your health care provider. Get plenty of rest. If directed, check your blood sugar at home as told by your health care provider. Keep all follow-up visits. This is important. Contact a health care provider if: You do not get better as fast as expected. Your symptoms get worse or you get new symptoms. You keep having pain, weakness, or nausea. You get better and then pain comes back. You have a fever. Get help right away if: You vomit every time you eat or drink. Your pain becomes severe. Your skin or the white parts of your eyes turn yellow (jaundice). You have sudden swelling in your abdomen. You feel dizzy or you faint. Your blood sugar is high (over 300 mg/dL). You vomit blood. These symptoms may be an emergency. Get help right away. Call 911. Do not wait to see if the symptoms will go away. Do not drive yourself to the hospital. Summary Acute pancreatitis happens when inflammation of the pancreas suddenly occurs and the pancreas becomes irritated and swollen. This condition is typically caused by heavy alcohol use, drug use, or gallstones. Treatment for this condition usually requires a stay in the hospital. This information is not intended to replace advice given to you by your health care provider. Make sure you discuss any questions you have with your health care provider. Document Revised: 12/13/2020 Document Reviewed: 12/13/2020 Elsevier Patient Education  2024 [employer].

## 2022-10-12 NOTE — Progress Notes (Addendum)
Subjective:    Patient ID: Charles Beltran, male    DOB: April 30, 1950, 72 y.o.   MRN: [id number]   Chief Complaint  Patient presents with   Hospitalization Follow-up   Pt presents to the office today for hospital follow up. He came to the office on 10/04/22 with abdominal pain and constipation. Labs returned with elevated liver enzymes  and sent to ED. He was diagnosed with acute pancreatitis.   Pt reports he does not drink. His CT showed, "Slight haziness or med the pancreatic head which could reflect early focal acute pancreatitis. Recommend clinical correlation.   Small hiatal hernia.   Aortic atherosclerosis.   Prostate enlargement."  He reports his abdominal pain resolved. Denies any fever, nausea, pr vomiting. Constipation This is a new problem. The current episode started more than 1 year ago. The problem has been waxing and waning since onset. His stool frequency is 1 time per day. He has tried diet changes and laxatives for the symptoms. The treatment provided mild relief.  Diabetes He presents for his follow-up diabetic visit. He has type 2 diabetes mellitus. Pertinent negatives for diabetes include no blurred vision and no foot paresthesias. (Does not check glucose at home)  Hyperlipidemia This is a chronic problem. The current episode started more than 1 year ago. Current antihyperlipidemic treatment includes statins. Risk factors for coronary artery disease include diabetes mellitus, dyslipidemia, hypertension, male sex and a sedentary lifestyle.      Review of Systems  Eyes:  Negative for blurred vision.  Gastrointestinal:  Positive for constipation.  All other systems reviewed and are negative.      Objective:   Physical Exam Vitals reviewed.  Constitutional:      General: He is not in acute distress.    Appearance: He is well-developed.  HENT:     Head: Normocephalic.     Right Ear: There is impacted cerumen.     Left Ear: There is impacted cerumen.  Eyes:      General:        Right eye: No discharge.        Left eye: No discharge.     Pupils: Pupils are equal, round, and reactive to light.  Neck:     Thyroid: No thyromegaly.  Cardiovascular:     Rate and Rhythm: Normal rate and regular rhythm.     Heart sounds: Murmur heard.  Pulmonary:     Effort: Pulmonary effort is normal. No respiratory distress.     Breath sounds: Normal breath sounds. No wheezing.  Abdominal:     General: Bowel sounds are normal. There is no distension.     Palpations: Abdomen is soft.     Tenderness: There is no abdominal tenderness.  Musculoskeletal:        General: No tenderness. Normal range of motion.     Cervical back: Normal range of motion and neck supple.  Skin:    General: Skin is warm and dry.     Findings: No erythema or rash.  Neurological:     Mental Status: He is alert and oriented to person, place, and time.     Cranial Nerves: No cranial nerve deficit.     Deep Tendon Reflexes: Reflexes are normal and symmetric.  Psychiatric:        Behavior: Behavior normal.        Thought Content: Thought content normal.        Judgment: Judgment normal.     BP (!) 113/54  Pulse 67   Temp (!) 97 F (36.1 C) (Temporal)   Ht 5\' 9"  (1.753 m)   Wt 162 lb 3.2 oz (73.6 kg)   SpO2 97%   BMI 23.95 kg/m      Assessment & Plan:  Charles Beltran comes in today with chief complaint of Hospitalization Follow-up   Diagnosis and orders addressed:  1. Idiopathic acute pancreatitis without infection or necrosis - Bayer DCA Hb A1c Waived - CBC with Differential/Platelet - BMP8+EGFR - Hepatic function panel - Amylase - Lipase  2. Hospital discharge follow-up - CBC with Differential/Platelet - BMP8+EGFR  3. Elevated liver enzymes - CBC with Differential/Platelet - BMP8+EGFR  4. Prediabetes - CBC with Differential/Platelet - BMP8+EGFR  5. Hyperlipidemia associated with type 2 diabetes mellitus (HCC) - CBC with Differential/Platelet -  BMP8+EGFR - Hepatic function panel  6. Constipation in male - CBC with Differential/Platelet - BMP8+EGFR   Labs pending Hospital notes reviewed  Health Maintenance reviewed Diet and exercise encouraged  Follow up plan: Keep chronic follow up   Jannifer Rodney, FNP

## 2022-10-13 LAB — CBC WITH DIFFERENTIAL/PLATELET
Basophils Absolute: 0 10*3/uL (ref 0.0–0.2)
Basos: 0 %
EOS (ABSOLUTE): 0 10*3/uL (ref 0.0–0.4)
Eos: 1 %
Hematocrit: 45.5 % (ref 37.5–51.0)
Hemoglobin: 15.5 g/dL (ref 13.0–17.7)
Immature Grans (Abs): 0 10*3/uL (ref 0.0–0.1)
Immature Granulocytes: 0 %
Lymphocytes Absolute: 0.7 10*3/uL (ref 0.7–3.1)
Lymphs: 14 %
MCH: 31 pg (ref 26.6–33.0)
MCHC: 34.1 g/dL (ref 31.5–35.7)
MCV: 91 fL (ref 79–97)
Monocytes Absolute: 1 10*3/uL — ABNORMAL HIGH (ref 0.1–0.9)
Monocytes: 20 %
Neutrophils Absolute: 3.4 10*3/uL (ref 1.4–7.0)
Neutrophils: 65 %
Platelets: 181 10*3/uL (ref 150–450)
RBC: 5 x10E6/uL (ref 4.14–5.80)
RDW: 12.4 % (ref 11.6–15.4)
WBC: 5.2 10*3/uL (ref 3.4–10.8)

## 2022-10-13 LAB — BMP8+EGFR
BUN/Creatinine Ratio: 18 (ref 10–24)
BUN: 15 mg/dL (ref 8–27)
CO2: 23 mmol/L (ref 20–29)
Calcium: 9 mg/dL (ref 8.6–10.2)
Chloride: 94 mmol/L — ABNORMAL LOW (ref 96–106)
Creatinine, Ser: 0.83 mg/dL (ref 0.76–1.27)
Glucose: 96 mg/dL (ref 70–99)
Potassium: 4.9 mmol/L (ref 3.5–5.2)
Sodium: 133 mmol/L — ABNORMAL LOW (ref 134–144)
eGFR: 94 mL/min/{1.73_m2} (ref >59)

## 2022-10-13 LAB — HEPATIC FUNCTION PANEL
ALT: 49 IU/L — ABNORMAL HIGH (ref 0–44)
AST: 34 IU/L (ref 0–40)
Albumin: 4.3 g/dL (ref 3.8–4.8)
Alkaline Phosphatase: 89 IU/L (ref 44–121)
Bilirubin Total: 0.4 mg/dL (ref 0.0–1.2)
Bilirubin, Direct: 0.16 mg/dL (ref 0.00–0.40)
Total Protein: 6.5 g/dL (ref 6.0–8.5)

## 2022-10-13 LAB — LIPASE: Lipase: 57 U/L (ref 13–78)

## 2022-10-13 LAB — AMYLASE: Amylase: 61 U/L (ref 31–110)

## 2022-10-16 ENCOUNTER — Telehealth: Payer: Self-pay | Admitting: Family

## 2022-10-16 NOTE — Telephone Encounter (Signed)
Pt's glucose and A1C has been at goal. Ok to hold this medication.

## 2022-10-17 NOTE — Telephone Encounter (Signed)
Patient aware and verbalized understanding. °

## 2022-10-18 ENCOUNTER — Telehealth: Payer: Self-pay

## 2022-10-18 NOTE — Telephone Encounter (Signed)
Transition Care Management Unsuccessful Follow-up Telephone Call  Date of discharge and from where:  10/05/2022 [facility]  Attempts:  2nd Attempt  Reason for unsuccessful TCM follow-up call:  Left voice message  Otniel Hoe [facility]  [facility], [facility] Resource Care Guide Direct Dial: [phone]  Website: [email or web address]

## 2022-10-18 NOTE — Telephone Encounter (Signed)
Transition Care Management Unsuccessful Follow-up Telephone Call  Date of discharge and from where:  10/05/2022 [facility]  Attempts:  1st Attempt  Reason for unsuccessful TCM follow-up call:  Left voice message  Kizzie Cotten [facility]  [facility], [facility] Resource Care Guide Direct Dial: [phone]  Website: [email or web address]

## 2022-10-24 ENCOUNTER — Other Ambulatory Visit: Payer: Self-pay | Admitting: Family

## 2022-10-24 DIAGNOSIS — I10 Essential (primary) hypertension: Secondary | ICD-10-CM

## 2022-10-24 DIAGNOSIS — R600 Localized edema: Secondary | ICD-10-CM

## 2022-10-25 ENCOUNTER — Ambulatory Visit: Payer: PPO | Admitting: Family

## 2022-10-25 ENCOUNTER — Encounter: Payer: Self-pay | Admitting: Family

## 2022-10-25 VITALS — BP 107/64 | HR 74 | Temp 97.9°F | Ht 69.0 in | Wt 157.0 lb

## 2022-10-25 DIAGNOSIS — Z0000 Encounter for general adult medical examination without abnormal findings: Secondary | ICD-10-CM | POA: Diagnosis not present

## 2022-10-25 DIAGNOSIS — E559 Vitamin D deficiency, unspecified: Secondary | ICD-10-CM

## 2022-10-25 DIAGNOSIS — E785 Hyperlipidemia, unspecified: Secondary | ICD-10-CM

## 2022-10-25 DIAGNOSIS — Z0001 Encounter for general adult medical examination with abnormal findings: Secondary | ICD-10-CM | POA: Diagnosis not present

## 2022-10-25 DIAGNOSIS — K5900 Constipation, unspecified: Secondary | ICD-10-CM | POA: Diagnosis not present

## 2022-10-25 DIAGNOSIS — K219 Gastro-esophageal reflux disease without esophagitis: Secondary | ICD-10-CM

## 2022-10-25 DIAGNOSIS — I10 Essential (primary) hypertension: Secondary | ICD-10-CM

## 2022-10-25 DIAGNOSIS — E1169 Type 2 diabetes mellitus with other specified complication: Secondary | ICD-10-CM

## 2022-10-25 DIAGNOSIS — R7303 Prediabetes: Secondary | ICD-10-CM | POA: Diagnosis not present

## 2022-10-25 LAB — LIPID PANEL

## 2022-10-25 NOTE — Patient Instructions (Signed)
Health Maintenance After Age 71 After age 71, you are at a higher risk for certain long-term diseases and infections as well as injuries from falls. Falls are a major cause of broken bones and head injuries in people who are older than age 71. Getting regular preventive care can help to keep you healthy and well. Preventive care includes getting regular testing and making lifestyle changes as recommended by your health care provider. Talk with your health care provider about: Which screenings and tests you should have. A screening is a test that checks for a disease when you have no symptoms. A diet and exercise plan that is right for you. What should I know about screenings and tests to prevent falls? Screening and testing are the best ways to find a health problem early. Early diagnosis and treatment give you the best chance of managing medical conditions that are common after age 71. Certain conditions and lifestyle choices may make you more likely to have a fall. Your health care provider may recommend: Regular vision checks. Poor vision and conditions such as cataracts can make you more likely to have a fall. If you wear glasses, make sure to get your prescription updated if your vision changes. Medicine review. Work with your health care provider to regularly review all of the medicines you are taking, including over-the-counter medicines. Ask your health care provider about any side effects that may make you more likely to have a fall. Tell your health care provider if any medicines that you take make you feel dizzy or sleepy. Strength and balance checks. Your health care provider may recommend certain tests to check your strength and balance while standing, walking, or changing positions. Foot health exam. Foot pain and numbness, as well as not wearing proper footwear, can make you more likely to have a fall. Screenings, including: Osteoporosis screening. Osteoporosis is a condition that causes  the bones to get weaker and break more easily. Blood pressure screening. Blood pressure changes and medicines to control blood pressure can make you feel dizzy. Depression screening. You may be more likely to have a fall if you have a fear of falling, feel depressed, or feel unable to do activities that you used to do. Alcohol use screening. Using too much alcohol can affect your balance and may make you more likely to have a fall. Follow these instructions at home: Lifestyle Do not drink alcohol if: Your health care provider tells you not to drink. If you drink alcohol: Limit how much you have to: 0-1 drink a day for women. 0-2 drinks a day for men. Know how much alcohol is in your drink. In the [place] one drink equals one 12 oz bottle of beer (355 mL), one 5 oz glass of wine (148 mL), or one 1 oz glass of hard liquor (44 mL). Do not use any products that contain nicotine or tobacco. These products include cigarettes, chewing tobacco, and vaping devices, such as e-cigarettes. If you need help quitting, ask your health care provider. Activity  Follow a regular exercise program to stay fit. This will help you maintain your balance. Ask your health care provider what types of exercise are appropriate for you. If you need a cane or walker, use it as recommended by your health care provider. Wear supportive shoes that have nonskid soles. Safety  Remove any tripping hazards, such as rugs, cords, and clutter. Install safety equipment such as grab bars in bathrooms and safety rails on stairs. Keep rooms and walkways   well-lit. General instructions Talk with your health care provider about your risks for falling. Tell your health care provider if: You fall. Be sure to tell your health care provider about all falls, even ones that seem minor. You feel dizzy, tiredness (fatigue), or off-balance. Take over-the-counter and prescription medicines only as told by your health care provider. These include  supplements. Eat a healthy diet and maintain a healthy weight. A healthy diet includes low-fat dairy products, low-fat (lean) meats, and fiber from whole grains, beans, and lots of fruits and vegetables. Stay current with your vaccines. Schedule regular health, dental, and eye exams. Summary Having a healthy lifestyle and getting preventive care can help to protect your health and wellness after age 71. Screening and testing are the best way to find a health problem early and help you avoid having a fall. Early diagnosis and treatment give you the best chance for managing medical conditions that are more common for people who are older than age 71. Falls are a major cause of broken bones and head injuries in people who are older than age 71. Take precautions to prevent a fall at home. Work with your health care provider to learn what changes you can make to improve your health and wellness and to prevent falls. This information is not intended to replace advice given to you by your health care provider. Make sure you discuss any questions you have with your health care provider. Document Revised: 06/13/2020 Document Reviewed: 06/13/2020 Elsevier Patient Education  2024 Elsevier Inc.  

## 2022-10-25 NOTE — Progress Notes (Signed)
Subjective:    Patient ID: Charles Beltran, male    DOB: 03/31/1950, 72 y.o.   MRN: [id number]  Chief Complaint  Patient presents with   Annual Exam   Pt presents to the office today for CPE.  Hypertension This is a chronic problem. The current episode started more than 1 year ago. The problem has been resolved since onset. The problem is controlled. Pertinent negatives include no blurred vision, malaise/fatigue, peripheral edema or shortness of breath. Risk factors for coronary artery disease include dyslipidemia, male gender and sedentary lifestyle. The current treatment provides moderate improvement. There is no history of heart failure.  Gastroesophageal Reflux He complains of belching and heartburn. This is a chronic problem. The current episode started more than 1 year ago. The problem occurs occasionally. He has tried a histamine-2 antagonist for the symptoms. The treatment provided moderate relief.  Hyperlipidemia This is a chronic problem. The current episode started more than 1 year ago. The problem is controlled. Recent lipid tests were reviewed and are normal. Exacerbating diseases include obesity. Pertinent negatives include no shortness of breath. Current antihyperlipidemic treatment includes statins. The current treatment provides moderate improvement of lipids. Risk factors for coronary artery disease include dyslipidemia, hypertension, diabetes mellitus, a sedentary lifestyle and post-menopausal.  Constipation This is a chronic problem. The current episode started more than 1 year ago. The problem has been waxing and waning since onset. His stool frequency is 1 time per day. He has tried diet changes for the symptoms. The treatment provided moderate relief.  Diabetes Diabetes type: prediabetes. Pertinent negatives for diabetes include no blurred vision and no foot paresthesias. Symptoms are stable.      Review of Systems  Constitutional:  Negative for malaise/fatigue.  Eyes:   Negative for blurred vision.  Respiratory:  Negative for shortness of breath.   Gastrointestinal:  Positive for constipation and heartburn.  All other systems reviewed and are negative.  Family History  Problem Relation Age of Onset   Epilepsy Mother    Throat cancer Father    Alcohol abuse Father    Heart attack Brother    Stroke Brother    Social History   Socioeconomic History   Marital status: Single    Spouse name: Not on file   Number of children: 0   Years of education: 16   Highest education level: Bachelor's degree (e.g., BA, AB, BS)  Occupational History   Occupation: Retired  Tobacco Use   Smoking status: Never   Smokeless tobacco: Never  Vaping Use   Vaping status: Never Used  Substance and Sexual Activity   Alcohol use: No   Drug use: No   Sexual activity: Not Currently  Other Topics Concern   Not on file  Social History Narrative   Lives alone in an apartment with an Engineer, structural. Brother lives nearby, but most of family passed or lives out of state. No children, never married.   Social Determinants of Health   Financial Resource Strain: Low Risk  (08/07/2022)   Overall Financial Resource Strain (CARDIA)    Difficulty of Paying Living Expenses: Not hard at all  Food Insecurity: No Food Insecurity (08/07/2022)   Hunger Vital Sign    Worried About Running Out of Food in the Last Year: Never true    Ran Out of Food in the Last Year: Never true  Transportation Needs: No Transportation Needs (08/07/2022)   PRAPARE - Administrator, Civil Service (Medical): No    Lack  of Transportation (Non-Medical): No  Physical Activity: Inactive (08/07/2022)   Exercise Vital Sign    Days of Exercise per Week: 0 days    Minutes of Exercise per Session: 0 min  Stress: No Stress Concern Present (08/07/2022)   [employer] of Occupational Health - Occupational Stress Questionnaire    Feeling of Stress : Not at all  Social Connections: Socially Isolated (08/07/2022)    Social Connection and Isolation Panel [NHANES]    Frequency of Communication with Friends and Family: More than three times a week    Frequency of Social Gatherings with Friends and Family: More than three times a week    Attends Religious Services: Never    Database administrator or Organizations: No    Attends Banker Meetings: Never    Marital Status: Never married       Objective:   Physical Exam Vitals reviewed.  Constitutional:      General: He is not in acute distress.    Appearance: He is well-developed.  HENT:     Head: Normocephalic.     Right Ear: Tympanic membrane normal.     Left Ear: Tympanic membrane normal.  Eyes:     General:        Right eye: No discharge.        Left eye: No discharge.     Pupils: Pupils are equal, round, and reactive to light.  Neck:     Thyroid: No thyromegaly.  Cardiovascular:     Rate and Rhythm: Normal rate and regular rhythm.     Heart sounds: Normal heart sounds. No murmur heard. Pulmonary:     Effort: Pulmonary effort is normal. No respiratory distress.     Breath sounds: Normal breath sounds. No wheezing.  Abdominal:     General: Bowel sounds are normal. There is no distension.     Palpations: Abdomen is soft.     Tenderness: There is no abdominal tenderness.     Hernia: There is no hernia in the left inguinal area or right inguinal area.  Genitourinary:    Penis: Normal.      Testes: Normal.        Right: Mass, tenderness, swelling, testicular hydrocele or varicocele not present. Right testis is descended. Cremasteric reflex is present.         Left: Mass, tenderness, swelling, testicular hydrocele or varicocele not present. Left testis is descended. Cremasteric reflex is present.      Prostate: Normal.     Rectum: Normal.  Musculoskeletal:        General: No tenderness. Normal range of motion.     Cervical back: Normal range of motion and neck supple.  Lymphadenopathy:     Lower Body: No right inguinal  adenopathy. No left inguinal adenopathy.  Skin:    General: Skin is warm and dry.     Findings: No erythema or rash.  Neurological:     Mental Status: He is alert and oriented to person, place, and time.     Cranial Nerves: No cranial nerve deficit.     Deep Tendon Reflexes: Reflexes are normal and symmetric.  Psychiatric:        Behavior: Behavior normal.        Thought Content: Thought content normal.        Judgment: Judgment normal.       BP 107/64   Pulse 74   Temp 97.9 F (36.6 C) (Temporal)   Ht 5\' 9"  (1.753 m)  Wt 157 lb (71.2 kg)   SpO2 98%   BMI 23.18 kg/m      Assessment & Plan:  YORMAN CRASE comes in today with chief complaint of Annual Exam   Diagnosis and orders addressed:  1. Annual physical exam - CMP14+EGFR - CBC with Differential/Platelet - Lipid panel - TSH - PSA, total and free - EKG 12-Lead  2. Primary hypertension - CMP14+EGFR - CBC with Differential/Platelet  3. Hyperlipidemia associated with type 2 diabetes mellitus (HCC) - CMP14+EGFR - CBC with Differential/Platelet - Lipid panel  4. Prediabetes - CMP14+EGFR - CBC with Differential/Platelet  5. Vitamin D deficiency - CMP14+EGFR - CBC with Differential/Platelet  6. Gastroesophageal reflux disease, unspecified whether esophagitis present - CMP14+EGFR - CBC with Differential/Platelet  7. Constipation in male - CMP14+EGFR - CBC with Differential/Platelet   Labs pending Continue current medications  Health Maintenance reviewed Diet and exercise encouraged  Follow up plan: 6 months    Jannifer Rodney, FNP

## 2022-10-26 LAB — CBC WITH DIFFERENTIAL/PLATELET
Basophils Absolute: 0 10*3/uL (ref 0.0–0.2)
Basos: 0 %
EOS (ABSOLUTE): 0.1 10*3/uL (ref 0.0–0.4)
Eos: 1 %
Hematocrit: 44.3 % (ref 37.5–51.0)
Hemoglobin: 15.1 g/dL (ref 13.0–17.7)
Immature Grans (Abs): 0.1 10*3/uL (ref 0.0–0.1)
Immature Granulocytes: 1 %
Lymphocytes Absolute: 1 10*3/uL (ref 0.7–3.1)
Lymphs: 11 %
MCH: 30.6 pg (ref 26.6–33.0)
MCHC: 34.1 g/dL (ref 31.5–35.7)
MCV: 90 fL (ref 79–97)
Monocytes Absolute: 1.2 10*3/uL — ABNORMAL HIGH (ref 0.1–0.9)
Monocytes: 13 %
Neutrophils Absolute: 7.1 10*3/uL — ABNORMAL HIGH (ref 1.4–7.0)
Neutrophils: 74 %
Platelets: 308 10*3/uL (ref 150–450)
RBC: 4.93 x10E6/uL (ref 4.14–5.80)
RDW: 11.5 % — ABNORMAL LOW (ref 11.6–15.4)
WBC: 9.4 10*3/uL (ref 3.4–10.8)

## 2022-10-26 LAB — LIPID PANEL
Chol/HDL Ratio: 3 ratio (ref 0.0–5.0)
Cholesterol, Total: 139 mg/dL (ref 100–199)
HDL: 47 mg/dL (ref >39)
LDL Chol Calc (NIH): 77 mg/dL (ref 0–99)
Triglycerides: 78 mg/dL (ref 0–149)
VLDL Cholesterol Cal: 15 mg/dL (ref 5–40)

## 2022-10-26 LAB — CMP14+EGFR
ALT: 30 IU/L (ref 0–44)
AST: 32 IU/L (ref 0–40)
Albumin: 4.3 g/dL (ref 3.8–4.8)
Alkaline Phosphatase: 75 IU/L (ref 44–121)
BUN/Creatinine Ratio: 15 (ref 10–24)
BUN: 15 mg/dL (ref 8–27)
Bilirubin Total: 0.7 mg/dL (ref 0.0–1.2)
CO2: 21 mmol/L (ref 20–29)
Calcium: 9.4 mg/dL (ref 8.6–10.2)
Chloride: 98 mmol/L (ref 96–106)
Creatinine, Ser: 0.97 mg/dL (ref 0.76–1.27)
Globulin, Total: 2.3 g/dL (ref 1.5–4.5)
Glucose: 124 mg/dL — ABNORMAL HIGH (ref 70–99)
Potassium: 4.9 mmol/L (ref 3.5–5.2)
Sodium: 136 mmol/L (ref 134–144)
Total Protein: 6.6 g/dL (ref 6.0–8.5)
eGFR: 83 mL/min/{1.73_m2} (ref >59)

## 2022-10-26 LAB — PSA, TOTAL AND FREE
PSA, Free Pct: 27.6 %
PSA, Free: 0.47 ng/mL
Prostate Specific Ag, Serum: 1.7 ng/mL (ref 0.0–4.0)

## 2022-10-26 LAB — TSH: TSH: 1.52 u[IU]/mL (ref 0.450–4.500)

## 2022-10-30 ENCOUNTER — Encounter: Payer: PPO | Admitting: Family

## 2022-10-31 ENCOUNTER — Other Ambulatory Visit: Payer: Self-pay | Admitting: Family

## 2022-11-24 ENCOUNTER — Other Ambulatory Visit: Payer: Self-pay | Admitting: Family

## 2022-12-18 ENCOUNTER — Telehealth: Payer: Self-pay | Admitting: Pharmacist

## 2022-12-18 NOTE — Progress Notes (Signed)
Pharmacy Quality Measure Review  This patient is appearing on a report for being at risk of failing the adherence measure for diabetes medications this calendar year.   Medication: Jardiance 10 mg daily Last fill date: 6/10 for 90 day supply  Reviewed chart. Patient reported to the office in September that is Jardiance cost had increased, likely he hit the Principal Financial Gap. PCP noted it was OK for him to hold Jardiance at that time. No action needed at this time - will route to embedded pharmacist.   Janeice Robinson, PharmD, BCACP, CPP Clinical Pharmacist [facility] [phone]

## 2022-12-19 ENCOUNTER — Telehealth: Payer: Self-pay | Admitting: Pharmacist

## 2022-12-19 NOTE — Telephone Encounter (Signed)
Unsuccessful outreach to patient requesting return call.  Patient may be in need of assistance to continue to stay on Jardiance for next year.  His BG remains controlled.

## 2022-12-28 ENCOUNTER — Other Ambulatory Visit: Payer: Self-pay | Admitting: Family

## 2023-01-15 ENCOUNTER — Other Ambulatory Visit: Payer: Self-pay | Admitting: Family Medicine

## 2023-01-15 DIAGNOSIS — K5900 Constipation, unspecified: Secondary | ICD-10-CM

## 2023-01-18 ENCOUNTER — Other Ambulatory Visit: Payer: Self-pay | Admitting: Family

## 2023-01-18 DIAGNOSIS — R600 Localized edema: Secondary | ICD-10-CM

## 2023-01-18 DIAGNOSIS — I10 Essential (primary) hypertension: Secondary | ICD-10-CM

## 2023-01-31 ENCOUNTER — Telehealth: Payer: Self-pay | Admitting: Family Medicine

## 2023-01-31 NOTE — Telephone Encounter (Signed)
APPT MADE

## 2023-01-31 NOTE — Telephone Encounter (Signed)
Copied from CRM [id number]. Topic: Clinical - Medical Advice >> Jan 31, 2023 10:44 AM Mosetta Putt H wrote: Reason for CRM: pt have questions blood sugar, would like a machine

## 2023-02-01 ENCOUNTER — Ambulatory Visit: Payer: PPO

## 2023-02-04 ENCOUNTER — Ambulatory Visit: Payer: PPO | Admitting: Family

## 2023-02-04 ENCOUNTER — Encounter: Payer: Self-pay | Admitting: Family

## 2023-02-04 VITALS — BP 125/66 | HR 68 | Temp 97.9°F | Wt 170.8 lb

## 2023-02-04 DIAGNOSIS — E1169 Type 2 diabetes mellitus with other specified complication: Secondary | ICD-10-CM | POA: Diagnosis not present

## 2023-02-04 DIAGNOSIS — R7303 Prediabetes: Secondary | ICD-10-CM | POA: Diagnosis not present

## 2023-02-04 DIAGNOSIS — R200 Anesthesia of skin: Secondary | ICD-10-CM | POA: Diagnosis not present

## 2023-02-04 LAB — GLUCOSE HEMOCUE WAIVED: Glu Hemocue Waived: 159 mg/dL — ABNORMAL HIGH (ref 70–99)

## 2023-02-04 LAB — BAYER DCA HB A1C WAIVED: HB A1C (BAYER DCA - WAIVED): 6.6 % — ABNORMAL HIGH (ref 4.8–5.6)

## 2023-02-04 NOTE — Patient Instructions (Signed)

## 2023-02-04 NOTE — Progress Notes (Addendum)
Subjective:    Patient ID: Charles Beltran, male    DOB: 1950-05-18, 72 y.o.   MRN: [id number]  Chief Complaint  Patient presents with   Numbness    In finger wants sugar checked    PT presents to the office today with right hand tingling and numbness. He has diabetes and worried this was being caused from his glucose.   Reports his numbness comes and goes and has resolved now. Denies any decrease strength or grip.  Diabetes He presents for his follow-up diabetic visit. He has type 2 diabetes mellitus. Pertinent negatives for diabetes include no blurred vision and no foot paresthesias. Risk factors for coronary artery disease include diabetes mellitus, hypertension, sedentary lifestyle, male sex and obesity. He is following a generally unhealthy diet. (Does not check glucose at home)      Review of Systems  Eyes:  Negative for blurred vision.  All other systems reviewed and are negative.   Social History   Socioeconomic History   Marital status: Single    Spouse name: Not on file   Number of children: 0   Years of education: 16   Highest education level: Bachelor's degree (e.g., BA, AB, BS)  Occupational History   Occupation: Retired  Tobacco Use   Smoking status: Never   Smokeless tobacco: Never  Vaping Use   Vaping status: Never Used  Substance and Sexual Activity   Alcohol use: No   Drug use: No   Sexual activity: Not Currently  Other Topics Concern   Not on file  Social History Narrative   Lives alone in an apartment with an Engineer, structural. Brother lives nearby, but most of family passed or lives out of state. No children, never married.   Social Drivers of Corporate investment banker Strain: Low Risk  (08/07/2022)   Overall Financial Resource Strain (CARDIA)    Difficulty of Paying Living Expenses: Not hard at all  Food Insecurity: No Food Insecurity (08/07/2022)   Hunger Vital Sign    Worried About Running Out of Food in the Last Year: Never true    Ran Out of Food  in the Last Year: Never true  Transportation Needs: No Transportation Needs (08/07/2022)   PRAPARE - Administrator, Civil Service (Medical): No    Lack of Transportation (Non-Medical): No  Physical Activity: Inactive (08/07/2022)   Exercise Vital Sign    Days of Exercise per Week: 0 days    Minutes of Exercise per Session: 0 min  Stress: No Stress Concern Present (08/07/2022)   [employer] of Occupational Health - Occupational Stress Questionnaire    Feeling of Stress : Not at all  Social Connections: Socially Isolated (08/07/2022)   Social Connection and Isolation Panel [NHANES]    Frequency of Communication with Friends and Family: More than three times a week    Frequency of Social Gatherings with Friends and Family: More than three times a week    Attends Religious Services: Never    Database administrator or Organizations: No    Attends Engineer, structural: Never    Marital Status: Never married   Family History  Problem Relation Age of Onset   Epilepsy Mother    Throat cancer Father    Alcohol abuse Father    Heart attack Brother    Stroke Brother         Objective:   Physical Exam Vitals reviewed.  Constitutional:      General: He  is not in acute distress.    Appearance: He is well-developed.  HENT:     Head: Normocephalic.     Right Ear: Tympanic membrane normal.     Left Ear: Tympanic membrane normal.  Eyes:     General:        Right eye: No discharge.        Left eye: No discharge.     Pupils: Pupils are equal, round, and reactive to light.  Neck:     Thyroid: No thyromegaly.  Cardiovascular:     Rate and Rhythm: Normal rate and regular rhythm.     Heart sounds: Normal heart sounds. No murmur heard. Pulmonary:     Effort: Pulmonary effort is normal. No respiratory distress.     Breath sounds: Normal breath sounds. No wheezing.  Abdominal:     General: Bowel sounds are normal. There is no distension.     Palpations: Abdomen is  soft.     Tenderness: There is no abdominal tenderness.  Musculoskeletal:        General: No tenderness. Normal range of motion.     Cervical back: Normal range of motion and neck supple.  Skin:    General: Skin is warm and dry.     Findings: No erythema or rash.  Neurological:     Mental Status: He is alert and oriented to person, place, and time.     Cranial Nerves: No cranial nerve deficit.     Deep Tendon Reflexes: Reflexes are normal and symmetric.  Psychiatric:        Behavior: Behavior normal.        Thought Content: Thought content normal.        Judgment: Judgment normal.       BP 125/66   Pulse 68   Temp 97.9 F (36.6 C) (Oral)   Wt 170 lb 12.8 oz (77.5 kg)   SpO2 100%   BMI 25.22 kg/m      Assessment & Plan:  Charles Beltran comes in today with chief complaint of Numbness (In finger wants sugar checked )   Diagnosis and orders addressed:  1. Type 2 diabetes mellitus with other specified complication, without long-term current use of insulin (HCC) (Primary) Low carb  Healthy diet and exercise  - Bayer DCA Hb A1c Waived - Glucose Hemocue Waived  2. Numbness of right hand Resolved now I do not think this is neuropathy given symptoms have completely resolved     No follow-ups on file.    Jannifer Rodney, FNP

## 2023-02-05 ENCOUNTER — Other Ambulatory Visit: Payer: Self-pay | Admitting: Family

## 2023-03-04 ENCOUNTER — Telehealth: Payer: Self-pay | Admitting: Family Medicine

## 2023-03-04 NOTE — Telephone Encounter (Signed)
Appt made with pcp for body aches

## 2023-03-04 NOTE — Telephone Encounter (Signed)
Copied from CRM [id number]. Topic: Clinical - Medical Advice >> Mar 04, 2023 12:31 PM Desma Mcgregor wrote: Reason for CRM: Patient asking if nurse Morrie Sheldon can give him a call back asap about his pre-diabetes. He is having some issues/concerns.

## 2023-03-05 ENCOUNTER — Ambulatory Visit (INDEPENDENT_AMBULATORY_CARE_PROVIDER_SITE_OTHER): Payer: PPO | Admitting: Family

## 2023-03-05 ENCOUNTER — Encounter: Payer: Self-pay | Admitting: Family

## 2023-03-05 VITALS — BP 134/58 | HR 60 | Temp 97.6°F | Ht 69.0 in | Wt 170.0 lb

## 2023-03-05 DIAGNOSIS — R300 Dysuria: Secondary | ICD-10-CM | POA: Diagnosis not present

## 2023-03-05 DIAGNOSIS — E1169 Type 2 diabetes mellitus with other specified complication: Secondary | ICD-10-CM

## 2023-03-05 DIAGNOSIS — E785 Hyperlipidemia, unspecified: Secondary | ICD-10-CM

## 2023-03-05 DIAGNOSIS — R52 Pain, unspecified: Secondary | ICD-10-CM | POA: Diagnosis not present

## 2023-03-05 DIAGNOSIS — B349 Viral infection, unspecified: Secondary | ICD-10-CM | POA: Diagnosis not present

## 2023-03-05 LAB — URINALYSIS, COMPLETE
Bilirubin, UA: NEGATIVE
Glucose, UA: NEGATIVE
Ketones, UA: NEGATIVE
Leukocytes,UA: NEGATIVE
Nitrite, UA: NEGATIVE
Protein,UA: NEGATIVE
RBC, UA: NEGATIVE
Specific Gravity, UA: 1.01 (ref 1.005–1.030)
Urobilinogen, Ur: 0.2 mg/dL (ref 0.2–1.0)
pH, UA: 7 (ref 5.0–7.5)

## 2023-03-05 LAB — CMP14+EGFR
ALT: 39 [IU]/L (ref 0–44)
AST: 30 [IU]/L (ref 0–40)
Albumin: 4.8 g/dL (ref 3.8–4.8)
Alkaline Phosphatase: 76 [IU]/L (ref 44–121)
BUN/Creatinine Ratio: 19 (ref 10–24)
BUN: 15 mg/dL (ref 8–27)
Bilirubin Total: 0.8 mg/dL (ref 0.0–1.2)
CO2: 24 mmol/L (ref 20–29)
Calcium: 10 mg/dL (ref 8.6–10.2)
Chloride: 99 mmol/L (ref 96–106)
Creatinine, Ser: 0.79 mg/dL (ref 0.76–1.27)
Globulin, Total: 2.5 g/dL (ref 1.5–4.5)
Glucose: 156 mg/dL — ABNORMAL HIGH (ref 70–99)
Potassium: 4.4 mmol/L (ref 3.5–5.2)
Sodium: 138 mmol/L (ref 134–144)
Total Protein: 7.3 g/dL (ref 6.0–8.5)
eGFR: 94 mL/min/{1.73_m2} (ref >59)

## 2023-03-05 LAB — MICROSCOPIC EXAMINATION
Bacteria, UA: NONE SEEN
Epithelial Cells (non renal): NONE SEEN /[HPF] (ref 0–10)
RBC, Urine: NONE SEEN /[HPF] (ref 0–2)
Renal Epithel, UA: NONE SEEN /[HPF]
Yeast, UA: NONE SEEN

## 2023-03-05 NOTE — Patient Instructions (Signed)
Viral Illness, Adult Viruses are tiny germs that can get into a person's body and cause illness. There are many different types of viruses. And they cause many types of illness. Viral illnesses can range from mild to severe. They can affect various parts of the body. Short-term conditions that are caused by a virus include colds and flu (influenza) and stomach viruses. Long-term conditions that are caused by a virus include herpes, shingles, and human immunodeficiency virus (HIV) infection. A few viruses have been linked to certain cancers. What are the causes? Many types of viruses can cause illness. Viruses get into cells in your body, multiply, and cause the infected cells to work differently or die. When these cells die, they release more of the virus. When this happens, you get symptoms of the illness and the virus spreads to other cells. If the virus takes over how the cell works, it can cause the cell to divide and grow out of control. This happens when a virus causes cancer. Different viruses get into the body in different ways. You can get a virus by: Swallowing food or water that has come in contact with the virus. Breathing in droplets that have been coughed or sneezed into the air by an infected person. Touching a surface that has the virus on it and then touching your eyes, nose, or mouth. Being bitten by an insect or animal that carries the virus. Having sexual contact with a person who is infected with the virus. Being exposed to blood or fluids that contain the virus, either through an open cut or during a transfusion. If a virus enters your body, your body's disease-fighting system (immune system) will try to fight the virus. You may be at higher risk for a viral illness if your immune system is weak. What are the signs or symptoms? Symptoms depend on the type of virus and the location of the cells that it gets into. Symptoms can include: For cold and flu  viruses: Fever. Headache. Sore throat. Muscle aches. Stuffy nose (nasal congestion). Cough. For stomach (gastrointestinal) viruses: Fever. Pain in the abdomen. Nausea or vomiting. Diarrhea. For liver viruses (hepatitis): Loss of appetite. Feeling tired. Skin or the white parts of your eyes turning yellow (jaundice). For brain and spinal cord viruses: Fever. Headache. Stiff neck. Nausea and vomiting. Confusion or being sleepy. For skin viruses: Warts. Itching. Rash. For sexually transmitted viruses: Discharge. Swelling. Redness. Rash. How is this diagnosed? This condition may be diagnosed based on one or more of these: Your symptoms and medical history. A physical exam. Tests, such as: Blood tests. Tests on a sample of mucus from your lungs (sputum sample). Tests on a poop (stool) sample. Tests on a swab of body fluids or a skin sore (lesion). How is this treated? Viruses can be hard to treat because they live within cells. Antibiotics do not treat viruses because these medicines do not get inside cells. Treatment for a viral illness may include: Resting and drinking a lot of fluids. Medicines to treat symptoms. These can include over-the-counter medicine for pain and fever, medicines for cough or congestion, and medicines for diarrhea. Antiviral medicines. These medicines are available only for certain types of viruses. Some viral illnesses can be prevented with vaccinations. A common example is the flu shot. Follow these instructions at home: Medicines Take over-the-counter and prescription medicines only as told by your health care provider. If you were prescribed an antiviral medicine, take it as told by your provider. Do not stop  taking the antiviral even if you start to feel better. Know when antibiotics are needed and when they are not needed. Antibiotics do not treat viruses. You may get an antibiotic if your provider thinks that you may have, or are at risk  for, a bacterial infection and you have a viral infection. Do not ask for an antibiotic prescription if you have been diagnosed with a viral illness. Antibiotics will not make your illness go away faster. Taking antibiotics when they are not needed can lead to antibiotic resistance. When this develops, the medicine no longer works against the bacteria that it normally fights. General instructions Drink enough fluids to keep your pee (urine) pale yellow. Rest as much as possible. Return to your normal activities as told by your provider. Ask your provider what activities are safe for you. How is this prevented? To lower your risk of getting another viral illness: Wash your hands often with soap and water for at least 20 seconds. If soap and water are not available, use hand sanitizer. Avoid touching your nose, eyes, and mouth, especially if you have not washed your hands recently. If anyone in your household has a viral infection, clean all household surfaces that may have been in contact with the virus. Use soap and hot water. You may also use a commercially prepared, bleach-containing solution. Stay away from people who are sick with symptoms of a viral infection. Do not share items such as toothbrushes and water bottles with other people. Keep your vaccinations up to date. This includes getting a yearly flu shot. Eat a healthy diet and get plenty of rest. Contact a health care provider if: You have symptoms of a viral illness that do not go away. Your symptoms come back after going away. Your symptoms get worse. Get help right away if: You have trouble breathing. You have a severe headache or a stiff neck. You have severe vomiting or pain in your abdomen. These symptoms may be an emergency. Get help right away. Call 911. Do not wait to see if the symptoms will go away. Do not drive yourself to the hospital. This information is not intended to replace advice given to you by your health  care provider. Make sure you discuss any questions you have with your health care provider. Document Revised: 02/07/2022 Document Reviewed: 11/22/2021 Elsevier Patient Education  2024 [employer].

## 2023-03-05 NOTE — Progress Notes (Addendum)
Subjective:    Patient ID: Charles Beltran, male    DOB: [birth year], 73 y.o.   MRN: [id number]  Chief Complaint  Patient presents with   Generalized Body Aches    WHOLE BODY IS SORE   Pt presents to the office today with body aches that started over a week ago. Denies any fever, cough, sore throat, SOB, or wheezing. Reports mild dysuria. Reports his body aches are intermittent  aching  5 out 10.   Requesting blood work. He is diabetic and worried this is causing his body aches.  Dysuria  This is a new problem. The current episode started in the past 7 days. The problem occurs intermittently. The quality of the pain is described as burning. The pain is at a severity of 2/10. The pain is mild. Associated symptoms include frequency. Pertinent negatives include no flank pain, hematuria, hesitancy, nausea, urgency or vomiting. He has tried increased fluids for the symptoms. The treatment provided mild relief.  Hyperlipidemia This is a chronic problem. The current episode started more than 1 year ago. Current antihyperlipidemic treatment includes diet change. The current treatment provides no improvement of lipids.      Review of Systems  Gastrointestinal:  Negative for nausea and vomiting.  Genitourinary:  Positive for dysuria and frequency. Negative for flank pain, hematuria, hesitancy and urgency.  All other systems reviewed and are negative.      Objective:   Physical Exam Vitals reviewed.  Constitutional:      General: He is not in acute distress.    Appearance: He is well-developed.  HENT:     Head: Normocephalic.     Right Ear: Tympanic membrane and external ear normal.     Left Ear: Tympanic membrane and external ear normal.  Eyes:     General:        Right eye: No discharge.        Left eye: No discharge.     Pupils: Pupils are equal, round, and reactive to light.  Neck:     Thyroid: No thyromegaly.  Cardiovascular:     Rate and Rhythm: Normal rate and regular rhythm.      Heart sounds: Murmur heard.  Pulmonary:     Effort: Pulmonary effort is normal. No respiratory distress.     Breath sounds: Normal breath sounds. No wheezing.  Abdominal:     General: Bowel sounds are normal. There is no distension.     Palpations: Abdomen is soft.     Tenderness: There is no abdominal tenderness.  Musculoskeletal:        General: No tenderness. Normal range of motion.     Cervical back: Normal range of motion and neck supple.  Skin:    General: Skin is warm and dry.     Findings: No erythema or rash.  Neurological:     Mental Status: He is alert and oriented to person, place, and time.     Cranial Nerves: No cranial nerve deficit.     Deep Tendon Reflexes: Reflexes are normal and symmetric.  Psychiatric:        Behavior: Behavior normal.        Thought Content: Thought content normal.        Judgment: Judgment normal.     BP (!) 134/58   Pulse 60   Temp 97.6 F (36.4 C)   Ht 5\' 9"  (1.753 m)   Wt 170 lb (77.1 kg)   SpO2 100%   BMI 25.10 kg/m  Assessment & Plan:  Charles Beltran comes in today with chief complaint of Generalized Body Aches (WHOLE BODY IS SORE)   Diagnosis and orders addressed:  1. Body aches (Primary) - CMP14+EGFR - Urinalysis, Complete  2. Viral illness - CMP14+EGFR  3. Dysuria - CMP14+EGFR - Urinalysis, Complete  4. Type 2 diabetes mellitus with other specified complication, without long-term current use of insulin (HCC) - CMP14+EGFR  5. Hyperlipidemia associated with type 2 diabetes mellitus (HCC) - CMP14+EGFR   Labs pending More than likely viral Rest Force fluids Low carb diet  Health Maintenance reviewed Diet and exercise encouraged    Jannifer Rodney, FNP

## 2023-03-06 ENCOUNTER — Telehealth: Payer: Self-pay

## 2023-03-06 NOTE — Telephone Encounter (Signed)
Copied from CRM [id number]. Topic: Clinical - Lab/Test Results >> Mar 06, 2023 12:26 PM Charles Beltran wrote: Patient wants his blood work and urine sample results.  Please call him at [phone].

## 2023-03-07 NOTE — Telephone Encounter (Signed)
Rerfer to labs

## 2023-03-12 ENCOUNTER — Other Ambulatory Visit: Payer: PPO

## 2023-04-23 ENCOUNTER — Other Ambulatory Visit: Payer: Self-pay | Admitting: Family

## 2023-04-25 ENCOUNTER — Ambulatory Visit: Payer: PPO | Admitting: Family

## 2023-05-06 ENCOUNTER — Ambulatory Visit: Payer: PPO | Admitting: Family

## 2023-05-14 ENCOUNTER — Other Ambulatory Visit: Payer: Self-pay | Admitting: Family

## 2023-05-14 ENCOUNTER — Ambulatory Visit: Payer: PPO | Admitting: Family

## 2023-05-14 ENCOUNTER — Encounter: Payer: Self-pay | Admitting: Family

## 2023-05-14 VITALS — BP 125/68 | HR 74 | Temp 97.6°F | Ht 69.0 in | Wt 168.0 lb

## 2023-05-14 DIAGNOSIS — I10 Essential (primary) hypertension: Secondary | ICD-10-CM | POA: Diagnosis not present

## 2023-05-14 DIAGNOSIS — K5900 Constipation, unspecified: Secondary | ICD-10-CM | POA: Diagnosis not present

## 2023-05-14 DIAGNOSIS — E1169 Type 2 diabetes mellitus with other specified complication: Secondary | ICD-10-CM | POA: Diagnosis not present

## 2023-05-14 DIAGNOSIS — E785 Hyperlipidemia, unspecified: Secondary | ICD-10-CM

## 2023-05-14 DIAGNOSIS — K219 Gastro-esophageal reflux disease without esophagitis: Secondary | ICD-10-CM | POA: Diagnosis not present

## 2023-05-14 DIAGNOSIS — E559 Vitamin D deficiency, unspecified: Secondary | ICD-10-CM

## 2023-05-14 DIAGNOSIS — R197 Diarrhea, unspecified: Secondary | ICD-10-CM

## 2023-05-14 LAB — CBC WITH DIFFERENTIAL/PLATELET
Basophils Absolute: 0 10*3/uL (ref 0.0–0.2)
Basos: 0 %
EOS (ABSOLUTE): 0.1 10*3/uL (ref 0.0–0.4)
Eos: 2 %
Hematocrit: 44.7 % (ref 37.5–51.0)
Hemoglobin: 15.1 g/dL (ref 13.0–17.7)
Immature Grans (Abs): 0 10*3/uL (ref 0.0–0.1)
Immature Granulocytes: 0 %
Lymphocytes Absolute: 1.2 10*3/uL (ref 0.7–3.1)
Lymphs: 14 %
MCH: 30.8 pg (ref 26.6–33.0)
MCHC: 33.8 g/dL (ref 31.5–35.7)
MCV: 91 fL (ref 79–97)
Monocytes Absolute: 0.9 10*3/uL (ref 0.1–0.9)
Monocytes: 10 %
Neutrophils Absolute: 6.6 10*3/uL (ref 1.4–7.0)
Neutrophils: 74 %
Platelets: 203 10*3/uL (ref 150–450)
RBC: 4.9 x10E6/uL (ref 4.14–5.80)
RDW: 12.5 % (ref 11.6–15.4)
WBC: 8.9 10*3/uL (ref 3.4–10.8)

## 2023-05-14 LAB — BAYER DCA HB A1C WAIVED: HB A1C (BAYER DCA - WAIVED): 6.6 % — ABNORMAL HIGH (ref 4.8–5.6)

## 2023-05-14 NOTE — Progress Notes (Signed)
 Subjective:    Patient ID: Charles Beltran, male    DOB: Feb 23, 1950, 73 y.o.   MRN: [id number]  Chief Complaint  Patient presents with   Medical Management of Chronic Issues   Diarrhea     X2 weeks he has tried otc and it has not help. No real change in diet besides fat free milk.   PT presents to the office today for chronic follow up.  Hypertension This is a chronic problem. The current episode started more than 1 year ago. The problem has been resolved since onset. The problem is controlled. Pertinent negatives include no blurred vision, headaches, malaise/fatigue, peripheral edema or shortness of breath. Risk factors for coronary artery disease include obesity, male gender, dyslipidemia and sedentary lifestyle. The current treatment provides moderate improvement.  Gastroesophageal Reflux He complains of belching and heartburn. He reports no abdominal pain or no coughing. This is a chronic problem. The current episode started more than 1 year ago. The problem occurs occasionally. The problem has been resolved. The symptoms are aggravated by certain foods. Pertinent negatives include no weight loss. Risk factors include obesity. He has tried a PPI for the symptoms. The treatment provided moderate relief.  Diabetes He presents for his follow-up diabetic visit. He has type 2 diabetes mellitus. Pertinent negatives for hypoglycemia include no headaches. Pertinent negatives for diabetes include no blurred vision, no foot paresthesias and no weight loss. Symptoms are stable. Pertinent negatives for diabetic complications include no nephropathy or peripheral neuropathy. Risk factors for coronary artery disease include dyslipidemia, diabetes mellitus, hypertension and sedentary lifestyle. He is following a generally healthy diet. (Does not check BS at home )  Hyperlipidemia This is a chronic problem. The current episode started more than 1 year ago. The problem is controlled. Recent lipid tests were  reviewed and are normal. Exacerbating diseases include obesity. Pertinent negatives include no myalgias or shortness of breath. Current antihyperlipidemic treatment includes statins. The current treatment provides moderate improvement of lipids. Risk factors for coronary artery disease include dyslipidemia, diabetes mellitus, hypertension, male sex and a sedentary lifestyle.  Diarrhea  This is a chronic problem. The current episode started 1 to 4 weeks ago. The problem occurs 2 to 4 times per day. The problem has been gradually improving. The stool consistency is described as Watery. Associated symptoms include increased flatus. Pertinent negatives include no abdominal pain, arthralgias, bloating, chills, coughing, fever, headaches, myalgias, vomiting or weight loss. He has tried bismuth subsalicylate for the symptoms. The treatment provided mild relief.      Review of Systems  Constitutional:  Negative for chills, fever, malaise/fatigue and weight loss.  Eyes:  Negative for blurred vision.  Respiratory:  Negative for cough and shortness of breath.   Gastrointestinal:  Positive for diarrhea, flatus and heartburn. Negative for abdominal pain, bloating and vomiting.  Musculoskeletal:  Negative for arthralgias and myalgias.  Neurological:  Negative for headaches.  All other systems reviewed and are negative.      Objective:   Physical Exam Vitals reviewed.  Constitutional:      General: He is not in acute distress.    Appearance: He is well-developed.  HENT:     Head: Normocephalic.     Right Ear: Tympanic membrane normal.     Left Ear: Tympanic membrane normal.  Eyes:     General:        Right eye: No discharge.        Left eye: No discharge.     Pupils: Pupils  are equal, round, and reactive to light.  Neck:     Thyroid: No thyromegaly.  Cardiovascular:     Rate and Rhythm: Normal rate and regular rhythm.     Heart sounds: Murmur heard.  Pulmonary:     Effort: Pulmonary effort is  normal. No respiratory distress.     Breath sounds: Normal breath sounds. No wheezing.  Abdominal:     General: Bowel sounds are normal. There is no distension.     Palpations: Abdomen is soft.     Tenderness: There is no abdominal tenderness.  Musculoskeletal:        General: No tenderness. Normal range of motion.     Cervical back: Normal range of motion and neck supple.  Skin:    General: Skin is warm and dry.     Findings: No erythema or rash.  Neurological:     Mental Status: He is alert and oriented to person, place, and time.     Cranial Nerves: No cranial nerve deficit.     Deep Tendon Reflexes: Reflexes are normal and symmetric.  Psychiatric:        Behavior: Behavior normal.        Thought Content: Thought content normal.        Judgment: Judgment normal.    Diabetic Foot Exam - Simple   Simple Foot Form Diabetic Foot exam was performed with the following findings: Yes 05/14/2023  9:47 AM  Visual Inspection No deformities, no ulcerations, no other skin breakdown bilaterally: Yes Sensation Testing Intact to touch and monofilament testing bilaterally: Yes Pulse Check Posterior Tibialis and Dorsalis pulse intact bilaterally: Yes Comments         BP 125/68   Pulse 74   Temp 97.6 F (36.4 C) (Temporal)   Ht 5\' 9"  (1.753 m)   Wt 168 lb (76.2 kg)   SpO2 99%   BMI 24.81 kg/m      Assessment & Plan:   BATU CASSIN comes in today with chief complaint of Medical Management of Chronic Issues and Diarrhea ( X2 weeks he has tried otc and it has not help. No real change in diet besides fat free milk.)   Diagnosis and orders addressed:  1. Primary hypertension - CBC with Differential/Platelet  2. Hyperlipidemia associated with type 2 diabetes mellitus (HCC) - CBC with Differential/Platelet  3. Vitamin D deficiency - CBC with Differential/Platelet  4. Type 2 diabetes mellitus with other specified complication, without long-term current use of insulin (HCC)  (Primary) - Bayer DCA Hb A1c Waived - CBC with Differential/Platelet - Microalbumin / creatinine urine ratio  5. Gastroesophageal reflux disease, unspecified whether esophagitis present - CBC with Differential/Platelet  6. Constipation in male  - CBC with Differential/Platelet  7. Diarrhea, unspecified type Imodium as needed BRAT diet FOBT pending  - CBC with Differential/Platelet - Fecal occult blood, imunochemical([facility])    Labs pending FOBT pending Continue current medications  Health Maintenance reviewed Diet and exercise encouraged  Follow up plan: 6 months    Jannifer Rodney, FNP

## 2023-05-14 NOTE — Patient Instructions (Signed)
 Diarrhea, Adult Diarrhea is frequent loose and sometimes watery bowel movements. Diarrhea can make you feel weak and cause you to become dehydrated. Dehydration is a condition in which there is not enough water or other fluids in the body. Dehydration can make you tired and thirsty, cause you to have a dry mouth, and decrease how often you urinate. Diarrhea typically lasts 2-3 days. However, it can last longer if it is a sign of something more serious. It is important to treat your diarrhea as told by your health care provider. Follow these instructions at home: Eating and drinking     Follow these recommendations as told by your health care provider: Take an oral rehydration solution (ORS). This is an over-the-counter medicine that helps return your body to its normal balance of nutrients and water. It is found at pharmacies and retail stores. Drink enough fluid to keep your urine pale yellow. Drink fluids such as water, diluted fruit juice, and low-calorie sports drinks. You can drink milk also, if desired. Sucking on ice chips is another way to get fluids. Avoid drinking fluids that contain a lot of sugar or caffeine, such as soda, energy drinks, and regular sports drinks. Avoid alcohol. Eat bland, easy-to-digest foods in small amounts as you are able. These foods include bananas, applesauce, rice, lean meats, toast, and crackers. Avoid spicy or fatty foods.  Medicines Take over-the-counter and prescription medicines only as told by your health care provider. If you were prescribed antibiotics, take them as told by your health care provider. Do not stop using the antibiotic even if you start to feel better. General instructions  Wash your hands often using soap and water for at least 20 seconds. If soap and water are not available, use hand sanitizer. Others in the household should wash their hands as well. Hands should be washed: After using the toilet or changing a diaper. Before  preparing, cooking, or serving food. While caring for a sick person or while visiting someone in a hospital. Rest at home while you recover. Take a warm bath to relieve any burning or pain from frequent diarrhea episodes. Watch your condition for any changes. Contact a health care provider if: You have a fever. Your diarrhea gets worse. You have new symptoms. You vomit every time you eat or drink. You feel light-headed, dizzy, or have a headache. You have muscle cramps. You have signs of dehydration, such as: Dark urine, very little urine, or no urine. Cracked lips. Dry mouth. Sunken eyes. Sleepiness. Weakness. You have bloody or black stools or stools that look like tar. You have severe pain, cramping, or bloating in your abdomen. Your skin feels cold and clammy. You feel confused. Get help right away if: You have chest pain or your heart is beating very quickly. You have trouble breathing or you are breathing very quickly. You feel extremely weak or you faint. These symptoms may be an emergency. Get help right away. Call 911. Do not wait to see if the symptoms will go away. Do not drive yourself to the hospital. This information is not intended to replace advice given to you by your health care provider. Make sure you discuss any questions you have with your health care provider. Document Revised: 07/11/2021 Document Reviewed: 07/11/2021 Elsevier Patient Education  2024 [employer].

## 2023-05-15 LAB — FECAL OCCULT BLOOD, IMMUNOCHEMICAL: Fecal Occult Bld: NEGATIVE

## 2023-05-16 ENCOUNTER — Other Ambulatory Visit: Payer: Self-pay | Admitting: Family

## 2023-05-16 LAB — MICROALBUMIN / CREATININE URINE RATIO
Creatinine, Urine: 32.8 mg/dL
Microalb/Creat Ratio: 33 mg/g{creat} — ABNORMAL HIGH (ref 0–29)
Microalbumin, Urine: 10.7 ug/mL

## 2023-05-16 NOTE — Telephone Encounter (Signed)
 Copied from CRM [id number]. Topic: General - Call Back - No Documentation >> May 16, 2023 12:24 PM Charles Beltran wrote: Reason for CRM: Patient wanted to know what his sugar level was from his last visit on 05/14/23. He would like a call from Charles Beltran, CMA to discuss.   Callback: [phone]

## 2023-05-18 ENCOUNTER — Other Ambulatory Visit: Payer: Self-pay | Admitting: Family

## 2023-05-30 LAB — OPHTHALMOLOGY REPORT-SCANNED

## 2023-06-07 ENCOUNTER — Other Ambulatory Visit: Payer: Self-pay | Admitting: Family

## 2023-07-09 ENCOUNTER — Other Ambulatory Visit: Payer: Self-pay | Admitting: Family

## 2023-07-09 DIAGNOSIS — I10 Essential (primary) hypertension: Secondary | ICD-10-CM

## 2023-07-09 DIAGNOSIS — R600 Localized edema: Secondary | ICD-10-CM

## 2023-07-18 ENCOUNTER — Other Ambulatory Visit: Payer: Self-pay | Admitting: Family

## 2023-07-26 ENCOUNTER — Ambulatory Visit: Admitting: Family Medicine

## 2023-07-26 ENCOUNTER — Ambulatory Visit: Payer: Self-pay

## 2023-07-26 ENCOUNTER — Telehealth: Payer: Self-pay

## 2023-07-26 ENCOUNTER — Encounter: Payer: Self-pay | Admitting: Family Medicine

## 2023-07-26 ENCOUNTER — Telehealth: Payer: Self-pay | Admitting: Family Medicine

## 2023-07-26 VITALS — BP 113/62 | HR 78 | Temp 98.3°F | Ht 69.0 in | Wt 171.0 lb

## 2023-07-26 DIAGNOSIS — J029 Acute pharyngitis, unspecified: Secondary | ICD-10-CM

## 2023-07-26 MED ORDER — BENZONATATE 200 MG PO CAPS: 200.0000 mg | ORAL_CAPSULE | Freq: Three times a day (TID) | ORAL | 1 refills | Status: DC | PRN

## 2023-07-26 MED ORDER — FLUTICASONE PROPIONATE 50 MCG/ACT NA SUSP: 2.0000 | Freq: Every evening | NASAL | 1 refills | Status: DC | PRN

## 2023-07-26 MED ORDER — DEXTROMETHORPHAN HBR 15 MG/5ML PO SYRP: 10.0000 mL | ORAL_SOLUTION | Freq: Four times a day (QID) | ORAL | 0 refills | Status: DC | PRN

## 2023-07-26 NOTE — Telephone Encounter (Signed)
 I sent another cough syrup for him but sometimes some insurance companies will not cover cough medicines.  He may just have to pay what ever the prices for it on self-pay.  Tell him to use [email or web address].

## 2023-07-26 NOTE — Telephone Encounter (Signed)
 Left message informing that new cough syrup was sent in.

## 2023-07-26 NOTE — Telephone Encounter (Signed)
 Patients insurance does not cover the tessalon  can we send something else over

## 2023-07-26 NOTE — Telephone Encounter (Signed)
 I sent in some cough Perles for him.

## 2023-07-26 NOTE — Telephone Encounter (Signed)
 FYI Only or Action Required?: FYI only for provider.  Patient was last seen in primary care on 05/14/2023 by Yevette Hem, FNP. Called Nurse Triage reporting Cough. Symptoms began several days ago. Interventions attempted: OTC medications: cough drop, OTC cough syrup and Rest, hydration, or home remedies. Symptoms are: gradually worsening.  Triage Disposition: See Physician Within 24 Hours  Patient/caregiver understands and will follow disposition?:        Copied from CRM [id number]. Topic: Clinical - Red Word Triage >> Jul 26, 2023  8:16 AM Elle L wrote: Red Word that prompted transfer to Nurse Triage: The patient has a runny nose, sore throat, and a worsening productive cough. It started three days ago and he has been trying over the counter medications but it is not helping. Reason for Disposition  [1] Continuous (nonstop) coughing interferes with work or school AND [2] no improvement using cough treatment per Care Advice  Answer Assessment - Initial Assessment Questions 1. ONSET: When did the cough begin?      3 days  2. SEVERITY: How bad is the cough today?      Getting worse, seems to be worse at night  3. SPUTUM: Describe the color of your sputum (none, dry cough; clear, white, yellow, green)     Clear  4. HEMOPTYSIS: Are you coughing up any blood? If so ask: How much? (flecks, streaks, tablespoons, etc.)     No  5. DIFFICULTY BREATHING: Are you having difficulty breathing? If Yes, ask: How bad is it? (e.g., mild, moderate, severe)    - MILD: No SOB at rest, mild SOB with walking, speaks normally in sentences, can lie down, no retractions, pulse < 100.    - MODERATE: SOB at rest, SOB with minimal exertion and prefers to sit, cannot lie down flat, speaks in phrases, mild retractions, audible wheezing, pulse 100-120.    - SEVERE: Very SOB at rest, speaks in single words, struggling to breathe, sitting hunched forward, retractions, pulse > 120      No shortness of  breath  6. FEVER: Do you have a fever? If Yes, ask: What is your temperature, how was it measured, and when did it start?     Has not been able to check   7. CARDIAC HISTORY: Do you have any history of heart disease? (e.g., heart attack, congestive heart failure)      No 8. LUNG HISTORY: Do you have any history of lung disease?  (e.g., pulmonary embolus, asthma, emphysema)     No  9. PE RISK FACTORS: Do you have a history of blood clots? (or: recent major surgery, recent prolonged travel, bedridden)     *No Answer* 10. OTHER SYMPTOMS: Do you have any other symptoms? (e.g., runny nose, wheezing, chest pain)       Runny nose, sore throat  11. PREGNANCY: Is there any chance you are pregnant? When was your last menstrual period?       N/a 12. TRAVEL: Have you traveled out of the country in the last month? (e.g., travel history, exposures)       *No Answer*  Protocols used: Cough - Acute Productive-A-AH

## 2023-07-26 NOTE — Progress Notes (Signed)
 BP 113/62   Pulse 78   Temp 98.3 F (36.8 C)   Ht 5' 9 (1.753 m)   Wt 171 lb (77.6 kg)   SpO2 96%   BMI 25.25 kg/m    Subjective:   Patient ID: Charles Beltran, male    DOB: 08-01-1950, 73 y.o.   MRN: [id number]  HPI: Charles Beltran is a 73 y.o. male presenting on 07/26/2023 for Cough and Sore Throat   HPI Sore throat and congestion and cough Patient has been having some sore throat congestion and cough has been going on over the past couple days.  He says it is worse at night and not as much during the day.  He also has a little bit of stomach discomfort and indigestion that has been getting worse at night and not as much during the day.  He is also getting runny nose.  He denies any fevers or chills or shortness of breath or wheezing that he knows of.  He has tried to use cough syrup and DayQuil and lozenges without much success and does not feel like they are helping.  He feels like is not worsening but is not improving and the cough keeps him up at night sometimes.  Relevant past medical, surgical, family and social history reviewed and updated as indicated. Interim medical history since our last visit reviewed. Allergies and medications reviewed and updated.  Review of Systems  Constitutional:  Negative for chills and fever.  HENT:  Positive for congestion, postnasal drip and sore throat. Negative for ear discharge, ear pain, rhinorrhea, sinus pressure, sneezing and voice change.   Eyes:  Negative for pain, discharge, redness and visual disturbance.  Respiratory:  Positive for cough. Negative for shortness of breath and wheezing.   Cardiovascular:  Negative for chest pain and leg swelling.  Musculoskeletal:  Negative for gait problem.  Skin:  Negative for rash.  All other systems reviewed and are negative.   Per HPI unless specifically indicated above   Allergies as of 07/26/2023       Reactions   Penicillins Shortness Of Breath   Metformin  And Related Diarrhea         Medication List        Accurate as of July 26, 2023  9:11 AM. If you have any questions, ask your nurse or doctor.          amLODipine  10 MG tablet Commonly known as: NORVASC  TAKE 1 TABLET BY MOUTH EVERY DAY   atorvastatin  20 MG tablet Commonly known as: LIPITOR TAKE 1 TABLET BY MOUTH EVERY DAY   famotidine  20 MG tablet Commonly known as: Pepcid  Take 1 tablet (20 mg total) by mouth 2 (two) times daily.   fish oil-omega-3 fatty acids 1000 MG capsule Take 1 g by mouth. One bid   fluticasone  50 MCG/ACT nasal spray Commonly known as: FLONASE  Place 2 sprays into both nostrils at bedtime as needed for allergies or rhinitis. Started by: Lucio Sabin Insiya Oshea   hydrochlorothiazide  12.5 MG capsule Commonly known as: MICROZIDE  TAKE 1 CAPSULE BY MOUTH EVERY DAY   lisinopril  10 MG tablet Commonly known as: ZESTRIL  TAKE 1 TABLET BY MOUTH EVERY DAY   OneTouch Delica Lancets 33G Misc Use to test blood sugar daily as directed; DX:E11.9   OneTouch Verio Flex System w/Device Kit Use to test blood sugar daily as directed; DX:E11.9   OneTouch Verio test strip Generic drug: glucose blood Use as instructed to test blood sugar daily. DX: E11.9  Vitamin D  50 MCG (2000 UT) tablet Take 2,000 Units by mouth daily.         Objective:   BP 113/62   Pulse 78   Temp 98.3 F (36.8 C)   Ht 5' 9 (1.753 m)   Wt 171 lb (77.6 kg)   SpO2 96%   BMI 25.25 kg/m   Wt Readings from Last 3 Encounters:  07/26/23 171 lb (77.6 kg)  05/14/23 168 lb (76.2 kg)  03/05/23 170 lb (77.1 kg)    Physical Exam Vitals and nursing note reviewed.  Constitutional:      General: He is not in acute distress.    Appearance: He is well-developed. He is not diaphoretic.  HENT:     Right Ear: Tympanic membrane, ear canal and external ear normal.     Left Ear: Tympanic membrane, ear canal and external ear normal.     Nose: Mucosal edema and rhinorrhea present.     Right Sinus: Maxillary sinus  tenderness present. No frontal sinus tenderness.     Left Sinus: Maxillary sinus tenderness present. No frontal sinus tenderness.     Mouth/Throat:     Pharynx: Uvula midline. Posterior oropharyngeal erythema present. No oropharyngeal exudate.     Tonsils: No tonsillar abscesses.   Eyes:     General: No scleral icterus.    Conjunctiva/sclera: Conjunctivae normal.   Neck:     Thyroid : No thyromegaly.   Cardiovascular:     Rate and Rhythm: Normal rate and regular rhythm.     Heart sounds: Murmur (Holosystolic murmur) heard.  Pulmonary:     Effort: Pulmonary effort is normal. No respiratory distress.     Breath sounds: Normal breath sounds. No wheezing or rales.   Musculoskeletal:        General: No swelling. Normal range of motion.     Cervical back: Neck supple.  Lymphadenopathy:     Cervical: No cervical adenopathy.   Skin:    General: Skin is warm and dry.     Findings: No rash.   Neurological:     Mental Status: He is alert and oriented to person, place, and time.     Coordination: Coordination normal.   Psychiatric:        Behavior: Behavior normal.       Assessment & Plan:   Problem List Items Addressed This Visit   None Visit Diagnoses       Viral pharyngitis    -  Primary       Recommend Flonase  2 sprays in each nostril nightly  Recommend Benadryl over-the-counter nightly Until symptoms recover  Can do salt water gargles 2-3 times daily with 1 teaspoon of salt per cup of water Follow up plan: Return if symptoms worsen or fail to improve.  Counseling provided for all of the vaccine components No orders of the defined types were placed in this encounter.   Jolyne Needs, MD [facility] 07/26/2023, 9:11 AM

## 2023-07-26 NOTE — Telephone Encounter (Signed)
 Copied from CRM [id number]. Topic: Clinical - Medication Question >> Jul 26, 2023 12:58 PM Star East wrote: Reason for CRM: Patient is asking if he can get a prescription cough medicine- [phone]

## 2023-07-26 NOTE — Patient Instructions (Signed)
 Recommend Flonase  2 sprays in each nostril nightly  Recommend Benadryl over-the-counter nightly Until symptoms recover  Can do salt water gargles 2-3 times daily with 1 teaspoon of salt per cup of water

## 2023-07-26 NOTE — Addendum Note (Signed)
 Addended by: Jolyne Needs on: 07/26/2023 03:12 PM   Modules accepted: Orders

## 2023-07-26 NOTE — Telephone Encounter (Signed)
 Seen Dettinger today. Please advise

## 2023-07-26 NOTE — Telephone Encounter (Signed)
 Patient aware and verbalized understanding.

## 2023-07-26 NOTE — Telephone Encounter (Signed)
 Patient seen in office today.

## 2023-08-08 ENCOUNTER — Ambulatory Visit: Payer: PPO

## 2023-08-08 ENCOUNTER — Ambulatory Visit: Payer: Self-pay | Admitting: *Deleted

## 2023-08-08 VITALS — BP 113/62 | HR 78 | Ht 69.0 in | Wt 171.0 lb

## 2023-08-08 DIAGNOSIS — Z0000 Encounter for general adult medical examination without abnormal findings: Secondary | ICD-10-CM

## 2023-08-08 NOTE — Telephone Encounter (Signed)
Noted  -LS

## 2023-08-08 NOTE — Telephone Encounter (Signed)
 Called to tell patient he needs to go to the ER please advise when he  comes back.

## 2023-08-08 NOTE — Telephone Encounter (Unsigned)
 Copied from CRM [id number]. Topic: General - Other >> Aug 08, 2023  9:59 AM [name] wrote: Reason for CRM: Pt returning Charles Beltran, CMA call provided information to go to ER. Pt stated he would take his chances and not go to ER.

## 2023-08-08 NOTE — Patient Instructions (Signed)
 Charles Beltran , Thank you for taking time out of your busy schedule to complete your Annual Wellness Visit with me. I enjoyed our conversation and look forward to speaking with you again next year. I, as well as your care team,  appreciate your ongoing commitment to your health goals. Please review the following plan we discussed and let me know if I can assist you in the future. Your Game plan/ To Do List    Follow up Visits: Next Medicare AWV with our clinical staff: 08/11/24 at 9:20a.m.   Have you seen your provider in the last 6 months (3 months if uncontrolled diabetes)? Yes Next Office Visit with your provider: 11/15/23 at 9:25a.m.  Clinician Recommendations:  Aim for 30 minutes of exercise or brisk walking, 6-8 glasses of water, and 5 servings of fruits and vegetables each day.       This is a list of the screening recommended for you and due dates:  Health Maintenance  Topic Date Due   Eye exam for diabetics  05/10/2023   Medicare Annual Wellness Visit  08/07/2023   Colon Cancer Screening  08/15/2023   COVID-19 Vaccine (6 - 2024-25 season) 08/23/2024*   Flu Shot  09/06/2023   Hemoglobin A1C  11/13/2023   Yearly kidney function blood test for diabetes  03/04/2024   Yearly kidney health urinalysis for diabetes  05/13/2024   Complete foot exam   05/13/2024   Stool Blood Test  05/13/2024   DTaP/Tdap/Td vaccine (4 - Td or Tdap) 04/25/2032   Pneumococcal Vaccine for age over 68  Completed   Hepatitis C Screening  Completed   Zoster (Shingles) Vaccine  Completed   Hepatitis B Vaccine  Aged Out   HPV Vaccine  Aged Out   Meningitis B Vaccine  Aged Out  *Topic was postponed. The date shown is not the original due date.    Advanced directives: (Declined) Advance directive discussed with you today. Even though you declined this today, please call our office should you change your mind, and we can give you the proper paperwork for you to fill out. Advance Care Planning is important because  it:  [x]  Makes sure you receive the medical care that is consistent with your values, goals, and preferences  [x]  It provides guidance to your family and loved ones and reduces their decisional burden about whether or not they are making the right decisions based on your wishes.  Follow the link provided in your after visit summary or read over the paperwork we have mailed to you to help you started getting your Advance Directives in place. If you need assistance in completing these, please reach out to us  so that we can help you!  See attachments for Preventive Care and Fall Prevention Tips.

## 2023-08-08 NOTE — Progress Notes (Signed)
 Subjective:   Charles Beltran is a 73 y.o. who presents for a Medicare Wellness preventive visit.  As a reminder, Annual Wellness Visits don't include a physical exam, and some assessments may be limited, especially if this visit is performed virtually. We may recommend an in-person follow-up visit with your provider if needed.  Visit Complete: Virtual I connected with  Charles Beltran on 08/08/23 by a audio enabled telemedicine application and verified that I am speaking with the correct person using two identifiers.  Patient Location: Home  Provider Location: Home Office  I discussed the limitations of evaluation and management by telemedicine. The patient expressed understanding and agreed to proceed.  Vital Signs: Because this visit was a virtual/telehealth visit, some criteria may be missing or patient reported. Any vitals not documented were not able to be obtained and vitals that have been documented are patient reported.  VideoDeclined- This patient declined Librarian, academic. Therefore the visit was completed with audio only.  Persons Participating in Visit: Patient.  AWV Questionnaire: No: Patient Medicare AWV questionnaire was not completed prior to this visit.  Cardiac Risk Factors include: advanced age (>24men, >37 women);diabetes mellitus;dyslipidemia;hypertension;male gender     Objective:    Today's Vitals   08/08/23 0914  BP: 113/62  Pulse: 78  Weight: 171 lb (77.6 kg)   Body mass index is 25.25 kg/m.     08/08/2023    9:17 AM 10/05/2022   11:13 AM 08/07/2022    9:54 AM 12/20/2021    2:52 PM 08/03/2021    9:51 AM 08/02/2020   10:44 AM 07/27/2020    4:37 PM  Advanced Directives  Does Patient Have a Medical Advance Directive? No No No No No No No  Would patient like information on creating a medical advance directive?   No - Patient declined No - Patient declined Yes (MAU/Ambulatory/Procedural Areas - Information given) No - Patient  declined     Current Medications (verified) Outpatient Encounter Medications as of 08/08/2023  Medication Sig   amLODipine  (NORVASC ) 10 MG tablet TAKE 1 TABLET BY MOUTH EVERY DAY   atorvastatin  (LIPITOR) 20 MG tablet TAKE 1 TABLET BY MOUTH EVERY DAY   Blood Glucose Monitoring Suppl (ONETOUCH VERIO FLEX SYSTEM) w/Device KIT Use to test blood sugar daily as directed; DX:E11.9   Cholecalciferol (VITAMIN D ) 2000 UNITS tablet Take 2,000 Units by mouth daily.   dextromethorphan  15 MG/5ML syrup Take 10 mLs (30 mg total) by mouth 4 (four) times daily as needed for cough.   famotidine  (PEPCID ) 20 MG tablet Take 1 tablet (20 mg total) by mouth 2 (two) times daily.   fish oil-omega-3 fatty acids 1000 MG capsule Take 1 g by mouth. One bid   fluticasone  (FLONASE ) 50 MCG/ACT nasal spray Place 2 sprays into both nostrils at bedtime as needed for allergies or rhinitis.   glucose blood (ONETOUCH VERIO) test strip Use as instructed to test blood sugar daily. DX: E11.9   hydrochlorothiazide  (MICROZIDE ) 12.5 MG capsule TAKE 1 CAPSULE BY MOUTH EVERY DAY   lisinopril  (ZESTRIL ) 10 MG tablet TAKE 1 TABLET BY MOUTH EVERY DAY   OneTouch Delica Lancets 33G MISC Use to test blood sugar daily as directed; DX:E11.9   benzonatate  (TESSALON ) 200 MG capsule Take 1 capsule (200 mg total) by mouth 3 (three) times daily as needed for cough. (Patient not taking: Reported on 08/08/2023)   No facility-administered encounter medications on file as of 08/08/2023.    Allergies (verified) Penicillins and Metformin   and related   History: Past Medical History:  Diagnosis Date   Childhood asthma    Hyperlipidemia    Hypertension    History reviewed. No pertinent surgical history. Family History  Problem Relation Age of Onset   Epilepsy Mother    Throat cancer Father    Alcohol abuse Father    Heart attack Brother    Stroke Brother    Social History   Socioeconomic History   Marital status: Single    Spouse name: Not on  file   Number of children: 0   Years of education: 16   Highest education level: Bachelor's degree (e.g., BA, AB, BS)  Occupational History   Occupation: Retired  Tobacco Use   Smoking status: Never   Smokeless tobacco: Never  Vaping Use   Vaping status: Never Used  Substance and Sexual Activity   Alcohol use: No   Drug use: No   Sexual activity: Not Currently  Other Topics Concern   Not on file  Social History Narrative   Lives alone in an apartment with an Engineer, structural. Brother lives nearby, but most of family passed or lives out of state. No children, never married.   Social Drivers of Corporate investment banker Strain: Low Risk  (08/08/2023)   Overall Financial Resource Strain (CARDIA)    Difficulty of Paying Living Expenses: Not hard at all  Food Insecurity: No Food Insecurity (08/08/2023)   Hunger Vital Sign    Worried About Running Out of Food in the Last Year: Never true    Ran Out of Food in the Last Year: Never true  Transportation Needs: No Transportation Needs (08/08/2023)   PRAPARE - Administrator, Civil Service (Medical): No    Lack of Transportation (Non-Medical): No  Physical Activity: Inactive (08/08/2023)   Exercise Vital Sign    Days of Exercise per Week: 0 days    Minutes of Exercise per Session: 0 min  Stress: No Stress Concern Present (08/08/2023)   [employer] of Occupational Health - Occupational Stress Questionnaire    Feeling of Stress: Not at all  Social Connections: Socially Isolated (08/08/2023)   Social Connection and Isolation Panel    Frequency of Communication with Friends and Family: More than three times a week    Frequency of Social Gatherings with Friends and Family: More than three times a week    Attends Religious Services: Never    Database administrator or Organizations: No    Attends Engineer, structural: Never    Marital Status: Never married    Tobacco Counseling Counseling given: Yes    Clinical  Intake:  Pre-visit preparation completed: Yes  Pain : No/denies pain     BMI - recorded: 25.25 Nutritional Status: BMI 25 -29 Overweight Nutritional Risks: None Diabetes: Yes CBG done?: No  Lab Results  Component Value Date   HGBA1C 6.6 (H) 05/14/2023   HGBA1C 6.6 (H) 02/04/2023   HGBA1C 5.9 (H) 10/12/2022     How often do you need to have someone help you when you read instructions, pamphlets, or other written materials from your doctor or pharmacy?: 1 - Never  Interpreter Needed?: No  Comments: per our awv today, pt has stated that he fell last night when getting up to go to the bathroom. Pt felt dizzy and fell, hit his back and then his head hit the floor afterwards. As of right now pt do not have any other symptoms ie headach, just a  little sore in his back. Per pt he feels great. Pt has already notify pcp and is waiting for call to see if he needs to do anything els. alia t/cma Information entered by :: alia t/cma   Activities of Daily Living     08/08/2023    9:17 AM  In your present state of health, do you have any difficulty performing the following activities:  Hearing? 0  Vision? 0  Difficulty concentrating or making decisions? 0  Walking or climbing stairs? 0  Dressing or bathing? 0  Doing errands, shopping? 0  Preparing Food and eating ? N  Using the Toilet? N  In the past six months, have you accidently leaked urine? N  Do you have problems with loss of bowel control? N  Managing your Medications? N  Managing your Finances? N  Housekeeping or managing your Housekeeping? N    Patient Care Team: Lavell Bari LABOR, FNP as PCP - General (Family Medicine) Vicci Mcardle, OD (Optometry) Billee Mliss BIRCH, [facility] as Pharmacist (Family Medicine)  I have updated your Care Teams any recent Medical Services you may have received from other providers in the past year.     Assessment:   This is a routine wellness examination for Terri.  Hearing/Vision  screen Hearing Screening - Comments:: Pt denies hearing dif Vision Screening - Comments:: Pt denies vision dif/pt goes [facility] In [place], [place]/last ov in April 2025   Goals Addressed             This Visit's Progress    DIET - INCREASE WATER INTAKE   On track    Try to drink 6-8 glasses of water daily     Exercise 150 min/wk Moderate Activity   On track    Walking is a great option.       Depression Screen     08/08/2023    9:18 AM 07/26/2023    8:46 AM 03/05/2023    9:57 AM 10/25/2022    9:01 AM 10/04/2022    3:34 PM 08/07/2022    9:53 AM 07/05/2022   11:09 AM  PHQ 2/9 Scores  PHQ - 2 Score 0 0 0 0 0 0 0  PHQ- 9 Score    0 0      Fall Risk     08/08/2023    9:21 AM 08/08/2023    9:15 AM 07/26/2023    8:46 AM 03/05/2023    9:57 AM 10/25/2022    9:01 AM  Fall Risk   Falls in the past year? 1 1 0 0 0  Number falls in past yr:  0 0  0  Injury with Fall?  1 0  0  Comment  hit the back of his head/no symptoms     Risk for fall due to :  Impaired balance/gait;Impaired mobility No Fall Risks  History of fall(s)  Risk for fall due to: Comment  per pt woke felt dizzy loss balance and fell     Follow up  Falls evaluation completed Falls evaluation completed  Falls evaluation completed;Education provided    MEDICARE RISK AT HOME:  Medicare Risk at Home Any stairs in or around the home?: Yes If so, are there any without handrails?: Yes Home free of loose throw rugs in walkways, pet beds, electrical cords, etc?: Yes Adequate lighting in your home to reduce risk of falls?: Yes Life alert?: Yes Use of a cane, walker or w/c?: No Grab bars in the bathroom?: Yes Shower  chair or bench in shower?: No Elevated toilet seat or a handicapped toilet?: No  TIMED UP AND GO:  Was the test performed?  no  Cognitive Function: 6CIT completed        08/08/2023    9:19 AM 08/07/2022    9:54 AM 08/03/2021    9:52 AM 07/27/2019   10:35 AM 07/23/2018   10:23 AM  6CIT Screen  What Year? 0  points 0 points 0 points 0 points 0 points  What month? 0 points 0 points 0 points 0 points 0 points  What time? 0 points 0 points 0 points 0 points 0 points  Count back from 20 0 points 0 points 0 points 0 points 0 points  Months in reverse 0 points 0 points 0 points 0 points 0 points  Repeat phrase 0 points 0 points 0 points 0 points 0 points  Total Score 0 points 0 points 0 points 0 points 0 points    Immunizations Immunization History  Administered Date(s) Administered   DTaP 02/05/2001   Fluad Quad(high Dose 65+) 10/11/2021   Influenza Whole 10/26/2009   Influenza, High Dose Seasonal PF 10/04/2016, 10/21/2018, 10/19/2019, 10/17/2022   Influenza, Seasonal, Injecte, Preservative Fre 01/21/2009, 10/26/2009, 10/10/2010, 10/26/2011   Influenza,inj,Quad PF,6+ Mos 10/31/2012, 11/03/2013, 09/19/2015   Influenza-Unspecified 10/31/2012, 11/03/2013, 10/27/2014, 10/19/2019   Moderna Sars-Covid-2 Vaccination 05/12/2019, 06/09/2019, 12/15/2019, 05/17/2020, 10/25/2020   Pneumococcal Conjugate-13 02/10/2013   Pneumococcal Polysaccharide-23 01/24/2017   Respiratory Syncytial Virus Vaccine,Recomb Aduvanted(Arexvy) 11/07/2021   Tdap 12/14/2011, 04/26/2022   Zoster Recombinant(Shingrix ) 04/17/2021, 07/20/2021   Zoster, Live 08/18/2015    Screening Tests Health Maintenance  Topic Date Due   OPHTHALMOLOGY EXAM  05/10/2023   Colonoscopy  08/15/2023   COVID-19 Vaccine (6 - 2024-25 season) 08/23/2024 (Originally 10/07/2022)   INFLUENZA VACCINE  09/06/2023   HEMOGLOBIN A1C  11/13/2023   Diabetic kidney evaluation - eGFR measurement  03/04/2024   Diabetic kidney evaluation - Urine ACR  05/13/2024   FOOT EXAM  05/13/2024   COLON CANCER SCREENING ANNUAL FOBT  05/13/2024   Medicare Annual Wellness (AWV)  08/07/2024   DTaP/Tdap/Td (4 - Td or Tdap) 04/25/2032   Pneumococcal Vaccine: 50+ Years  Completed   Hepatitis C Screening  Completed   Zoster Vaccines- Shingrix   Completed   Hepatitis B Vaccines   Aged Out   HPV VACCINES  Aged Out   Meningococcal B Vaccine  Aged Out    Health Maintenance  Health Maintenance Due  Topic Date Due   OPHTHALMOLOGY EXAM  05/10/2023   Colonoscopy  08/15/2023   Health Maintenance Items Addressed: See Nurse Notes at the end of this note  Additional Screening:  Vision Screening: Recommended annual ophthalmology exams for early detection of glaucoma and other disorders of the eye. Would you like a referral to an eye doctor? No    Dental Screening: Recommended annual dental exams for proper oral hygiene  Community Resource Referral / Chronic Care Management: CRR required this visit?  No   CCM required this visit?  No   Plan:    I have personally reviewed and noted the following in the patient's chart:   Medical and social history Use of alcohol, tobacco or illicit drugs  Current medications and supplements including opioid prescriptions. Patient is not currently taking opioid prescriptions. Functional ability and status Nutritional status Physical activity Advanced directives List of other physicians Hospitalizations, surgeries, and ER visits in previous 12 months Vitals Screenings to include cognitive, depression, and falls Referrals and appointments  In addition, I have reviewed and discussed with patient certain preventive protocols, quality metrics, and best practice recommendations. A written personalized care plan for preventive services as well as general preventive health recommendations were provided to patient.   Ozie Ned, CMA   08/08/2023   After Visit Summary: (Declined) Due to this being a telephonic visit, with patients personalized plan was offered to patient but patient Declined AVS at this time   Notes: Please refer to Routing Comments. Pluse,FYI: per pt declined colonoscopy for now

## 2023-08-08 NOTE — Telephone Encounter (Signed)
 FYI Only or Action Required?: FYI only for provider.  Patient was last seen in primary care on 07/26/2023 by Dettinger, Fonda LABOR, MD. Called Nurse Triage reporting Fall. Symptoms began today. Interventions attempted: Nothing. Symptoms are: stable.  Triage Disposition: See HCP Within 4 Hours (Or PCP Triage)  Patient/caregiver understands and will follow disposition?: No open appointment- ED advised - patient declines- he really wants to come to office. Office notified.  Reason for Disposition  1] MILD or MODERATE neck pain AND [2] fall from 3 feet (1 meter) or 5 stairs, or higher  Answer Assessment - Initial Assessment Questions 1. MECHANISM: How did the fall happen?     Got dizzy and lost balance last night getting out of bed- into walk in closet 2. DOMESTIC VIOLENCE AND ELDER ABUSE SCREENING: Did you fall because someone pushed you or tried to hurt you? If Yes, ask: Are you safe now?     no 3. ONSET: When did the fall happen? (e.g., minutes, hours, or days ago)     Last night 4. LOCATION: What part of the body hit the ground? (e.g., back, buttocks, head, hips, knees, hands, head, stomach)     Back, head, hip- neck sore 5. INJURY: Did you hurt (injure) yourself when you fell? If Yes, ask: What did you injure? Tell me more about this? (e.g., body area; type of injury; pain severity)     Yes- fell back- neck sore, head hit floor 6. PAIN: Is there any pain? If Yes, ask: How bad is the pain? (e.g., Scale 1-10; or mild,  moderate, severe)   - NONE (0): No pain   - MILD (1-3): Doesn't interfere with normal activities    - MODERATE (4-7): Interferes with normal activities or awakens from sleep    - SEVERE (8-10): Excruciating pain, unable to do any normal activities      mild 7. SIZE: For cuts, bruises, or swelling, ask: How large is it? (e.g., inches or centimeters)      One place left side/hip- red abrasion  9. OTHER SYMPTOMS: Do you have any other symptoms? (e.g.,  dizziness, fever, weakness; new onset or worsening).      No dizziness 10. CAUSE: What do you think caused the fall (or falling)? (e.g., tripped, dizzy spell)       Dizzy when getting up  Answer Assessment - Initial Assessment Questions 1. MECHANISM: How did the injury happen? (e.g., fall, MVA, twisting injury; consider the possibility of domestic violence or elder abuse)     Fall last night -dizziness when getting up to use bathroom 2. ONSET: When did the injury happen? (e.g., minutes, hours, days)     Last night 3. LOCATION: What part of the neck is injured? Where does it hurt?     Back of neck 4. PAIN SEVERITY: How bad is the pain? Can you move the neck normally? (Scale 1-10; or mild, moderate, severe)   - NO PAIN (0): no pain, or only slight stiffness    - MILD (1-3): doesn't interfere with normal activities    - MODERATE (4-7): interferes with normal activities or awakens from sleep    - SEVERE (8-10):  excruciating pain, unable to do any normal activities       mild 5. CORD SYMPTOMS: Any weakness or numbness of the arms or legs?     no 6. SIZE: For cuts, bruises, or swelling, ask: How large is it? (e.g., inches or centimeters)      None known  8. OTHER SYMPTOMS: Do you have any other symptoms? (e.g., headache)     none  Protocols used: Falls and Falling-A-AH, Neck Injury-A-AH   Copied from CRM [id number]. Topic: Clinical - Red Word Triage >> Aug 08, 2023  8:04 AM Cherylann RAMAN wrote: Red Word that prompted transfer to Nurse Triage: Patient states that last night he got up to use the restroom and he went to take off his shorts when he felt dizzy and off balance and fell. He hit his back and the back of his head. The back of his neck his sore.

## 2023-08-13 ENCOUNTER — Emergency Department (HOSPITAL_COMMUNITY)

## 2023-08-13 ENCOUNTER — Encounter (HOSPITAL_COMMUNITY): Payer: Self-pay | Admitting: Emergency Medicine

## 2023-08-13 ENCOUNTER — Other Ambulatory Visit: Payer: Self-pay

## 2023-08-13 ENCOUNTER — Emergency Department (HOSPITAL_COMMUNITY)
Admission: EM | Admit: 2023-08-13 | Discharge: 2023-08-13 | Disposition: A | Discharge status: Home / Self Care | Attending: Emergency Medicine | Admitting: Emergency Medicine

## 2023-08-13 DIAGNOSIS — S161XXA Strain of muscle, fascia and tendon at neck level, initial encounter: Secondary | ICD-10-CM | POA: Diagnosis not present

## 2023-08-13 DIAGNOSIS — S300XXA Contusion of lower back and pelvis, initial encounter: Secondary | ICD-10-CM | POA: Diagnosis not present

## 2023-08-13 DIAGNOSIS — M50322 Other cervical disc degeneration at C5-C6 level: Secondary | ICD-10-CM | POA: Diagnosis not present

## 2023-08-13 DIAGNOSIS — I672 Cerebral atherosclerosis: Secondary | ICD-10-CM | POA: Diagnosis not present

## 2023-08-13 DIAGNOSIS — M4802 Spinal stenosis, cervical region: Secondary | ICD-10-CM | POA: Diagnosis not present

## 2023-08-13 DIAGNOSIS — W19XXXA Unspecified fall, initial encounter: Secondary | ICD-10-CM | POA: Insufficient documentation

## 2023-08-13 DIAGNOSIS — S199XXA Unspecified injury of neck, initial encounter: Secondary | ICD-10-CM | POA: Diagnosis not present

## 2023-08-13 DIAGNOSIS — M545 Low back pain, unspecified: Secondary | ICD-10-CM | POA: Diagnosis not present

## 2023-08-13 DIAGNOSIS — R9082 White matter disease, unspecified: Secondary | ICD-10-CM | POA: Diagnosis not present

## 2023-08-13 DIAGNOSIS — S39012A Strain of muscle, fascia and tendon of lower back, initial encounter: Secondary | ICD-10-CM | POA: Diagnosis not present

## 2023-08-13 DIAGNOSIS — I700 Atherosclerosis of aorta: Secondary | ICD-10-CM | POA: Diagnosis not present

## 2023-08-13 DIAGNOSIS — S0990XA Unspecified injury of head, initial encounter: Secondary | ICD-10-CM | POA: Insufficient documentation

## 2023-08-13 NOTE — ED Notes (Signed)
 See triage notes. Pt states the neck and lower back pain is intermittent and has absolutely no complaints at this time. A/o pupils perrla. No bruising or deformities noted.

## 2023-08-13 NOTE — ED Triage Notes (Signed)
 Pt fell backwards on July 3rd after a episode of feeling dizzy. Pt states he hit his back and back of his head. C/o of soreness to his neck and lower back.

## 2023-08-13 NOTE — Discharge Instructions (Signed)
 You were seen in the emergency department for continued head neck and back pain after a fall.  You had CAT scan of your head and neck along with x-rays of your back.  There were no significant traumatic findings.  You can use Tylenol for pain.  Follow-up with your regular doctor or return to the emergency department if any worsening or concerning symptoms.

## 2023-08-13 NOTE — ED Provider Notes (Signed)
 [place] EMERGENCY DEPARTMENT AT [facility] Provider Note   CSN: [id number] Arrival date & time: 08/13/23  9258     Patient presents with: No chief complaint on file.   Charles Beltran is a 73 y.o. male.  He is here for evaluation of head neck and back pain after a fall.  Said he fell about 5 days ago, got dizzy getting out of bed to go to the bathroom overnight.  Struck his back, and hit his head.  He did not lose consciousness.  Since then he has had some pain in his head neck and back.  Talk to his primary care doctor who recommended he come to the emergency department.  No numbness or weakness blurry vision double vision difficulty ambulating.  Not on a blood thinner.   The history is provided by the patient.  Fall This is a new problem. The current episode started more than 2 days ago. The problem has been gradually improving. Associated symptoms include headaches. Pertinent negatives include no chest pain, no abdominal pain and no shortness of breath. Associated symptoms comments: Neck and back pain. The symptoms are aggravated by bending and twisting. Nothing relieves the symptoms. He has tried rest for the symptoms. The treatment provided mild relief.       Prior to Admission medications   Medication Sig Start Date End Date Taking? Authorizing Provider  amLODipine  (NORVASC ) 10 MG tablet TAKE 1 TABLET BY MOUTH EVERY DAY 07/18/23   Lavell Lye A, FNP  atorvastatin  (LIPITOR) 20 MG tablet TAKE 1 TABLET BY MOUTH EVERY DAY 06/07/23   Lavell Lye A, FNP  benzonatate  (TESSALON ) 200 MG capsule Take 1 capsule (200 mg total) by mouth 3 (three) times daily as needed for cough. Patient not taking: Reported on 08/08/2023 07/26/23   Dettinger, Fonda LABOR, MD  Blood Glucose Monitoring Suppl (ONETOUCH VERIO FLEX SYSTEM) w/Device KIT Use to test blood sugar daily as directed; DX:E11.9 11/22/21   Lavell Lye LABOR, FNP  Cholecalciferol (VITAMIN D ) 2000 UNITS tablet Take 2,000 Units by mouth  daily.    [provider]  dextromethorphan  15 MG/5ML syrup Take 10 mLs (30 mg total) by mouth 4 (four) times daily as needed for cough. 07/26/23   Dettinger, Fonda LABOR, MD  famotidine  (PEPCID ) 20 MG tablet Take 1 tablet (20 mg total) by mouth 2 (two) times daily. 07/05/22   Lavell Lye LABOR, FNP  fish oil-omega-3 fatty acids 1000 MG capsule Take 1 g by mouth. One bid    [provider]  fluticasone  (FLONASE ) 50 MCG/ACT nasal spray Place 2 sprays into both nostrils at bedtime as needed for allergies or rhinitis. 07/26/23   Dettinger, Fonda LABOR, MD  glucose blood (ONETOUCH VERIO) test strip Use as instructed to test blood sugar daily. DX: E11.9 11/22/21   Lavell Lye A, FNP  hydrochlorothiazide  (MICROZIDE ) 12.5 MG capsule TAKE 1 CAPSULE BY MOUTH EVERY DAY 07/09/23   Lavell Lye A, FNP  lisinopril  (ZESTRIL ) 10 MG tablet TAKE 1 TABLET BY MOUTH EVERY DAY 05/20/23   Lavell Lye LABOR, FNP  OneTouch Delica Lancets 33G MISC Use to test blood sugar daily as directed; DX:E11.9 11/22/21   Lavell Lye LABOR, FNP    Allergies: Penicillins and Metformin  and related    Review of Systems  Constitutional:  Negative for fever.  Eyes:  Negative for visual disturbance.  Respiratory:  Negative for shortness of breath.   Cardiovascular:  Negative for chest pain.  Gastrointestinal:  Negative for abdominal pain.  Genitourinary:  Negative for dysuria.  Musculoskeletal:  Positive for back pain and neck pain.  Neurological:  Positive for headaches. Negative for syncope.    Updated Vital Signs BP (!) 143/61 (BP Location: Right Arm)   Pulse 86   Resp 18   SpO2 96%   Physical Exam Vitals and nursing note reviewed.  Constitutional:      General: He is not in acute distress.    Appearance: Normal appearance. He is well-developed.  HENT:     Head: Normocephalic and atraumatic.  Eyes:     Conjunctiva/sclera: Conjunctivae normal.  Cardiovascular:     Rate and Rhythm: Normal rate and regular  rhythm.     Heart sounds: No murmur heard. Pulmonary:     Effort: Pulmonary effort is normal. No respiratory distress.     Breath sounds: Normal breath sounds.  Abdominal:     Palpations: Abdomen is soft.     Tenderness: There is no abdominal tenderness. There is no guarding or rebound.  Musculoskeletal:        General: No deformity.     Cervical back: Neck supple. Tenderness (Has some mild diffuse neck pain) present.  Skin:    General: Skin is warm and dry.     Capillary Refill: Capillary refill takes less than 2 seconds.  Neurological:     General: No focal deficit present.     Mental Status: He is alert.     Motor: No weakness.     Gait: Gait normal.     (all labs ordered are listed, but only abnormal results are displayed) Labs Reviewed - No data to display  EKG: None  Radiology: DG Lumbar Spine Complete Result Date: 08/13/2023 CLINICAL DATA:  fall back pain EXAM: LUMBAR SPINE - COMPLETE 4+ VIEW COMPARISON:  10/05/2022 CT reconstructions FINDINGS: Normal alignment and preserved vertebral body heights. No acute osseous finding, fracture, compression deformity, or focal kyphosis. Similar calcified disc at L1-2. Facets are aligned. No significant facet arthropathy. No pars defects. SI joints are maintained. Included pelvis and hips unremarkable. Aorta atherosclerotic. IMPRESSION: 1. No acute finding by plain radiography. 2. Aortic Atherosclerosis (ICD10-I70.0). Electronically Signed   By: CHRISTELLA.  Shick M.D.   On: 08/13/2023 09:25   CT Cervical Spine Wo Contrast Result Date: 08/13/2023 CLINICAL DATA:  Neck trauma (Age >= 65y) EXAM: CT CERVICAL SPINE WITHOUT CONTRAST TECHNIQUE: Multidetector CT imaging of the cervical spine was performed without intravenous contrast. Multiplanar CT image reconstructions were also generated. RADIATION DOSE REDUCTION: This exam was performed according to the departmental dose-optimization program which includes automated exposure control, adjustment of the mA  and/or kV according to patient size and/or use of iterative reconstruction technique. COMPARISON:  None Available. FINDINGS: Alignment: Mild straightening of the normal cervical lordosis. Skull base and vertebrae: No acute fracture. No primary bone lesion or focal pathologic process. Soft tissues and spinal canal: No prevertebral fluid or swelling. No visible canal hematoma. 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 There is moderate central spinal canal stenosis and moderate bilateral neural foraminal stenosis. At C6-7, there is also disc space narrowing and diffuse endplate ridging with mild-to-moderate central spinal canal stenosis and moderate bilateral neural foraminal stenosis, slightly worse on the left. The other disc space levels are unremarkable. Upper chest: Negative. Other: None. IMPRESSION: 1. No evidence of acute traumatic injury. 2. 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 Electronically Signed   By: Evalene Coho M.D.   On: 08/13/2023 08:55   CT Head Wo Contrast Result Date: 08/13/2023 CLINICAL DATA:  Head trauma, minor (Age >= 65y) EXAM: CT HEAD WITHOUT CONTRAST TECHNIQUE: Contiguous axial images were obtained from the base of the skull through the vertex without intravenous contrast. RADIATION DOSE REDUCTION: This exam was performed according to the departmental dose-optimization program which includes automated exposure control, adjustment of the mA and/or kV according to patient size and/or use of iterative reconstruction technique. COMPARISON:  None Available. FINDINGS: Brain: Age-related atrophy and mild-to-moderate periventricular and deep cerebral white matter disease. No evidence of hemorrhage, mass, acute cortical infarct or hydrocephalus. Vascular: Atheromatous calcifications  within the carotid siphons and vertebral arteries. Skull: Intact and unremarkable.  No scalp soft tissue swelling. Sinuses/Orbits: Mild mucosal disease within the right frontal sinus. Moderate rightward deviation of the nasal septum. Normal orbits. Other: None. IMPRESSION: 1. Nonspecific cerebral white matter disease. No evidence of acute intracranial injury. Electronically Signed   By: Evalene Coho M.D.   On: 08/13/2023 08:50     Procedures   Medications Ordered in the ED - No data to display  Clinical Course as of 08/13/23 1636  Tue Aug 13, 2023  0801 Patient states he would feel better if he had imaging of his head and neck. [MB]    Clinical Course User Index [MB] Towana Ozell BROCKS, MD                                 Medical Decision Making Amount and/or Complexity of Data Reviewed Radiology: ordered.   This patient complains of head injury neck and back pain after a fall; this involves an extensive number of treatment Options and is a complaint that carries with it a high risk of complications and morbidity. The differential includes fracture, contusion, concussion, bleed I ordered imaging studies which included CT head and cervical spine, lumbar spine x-rays and I independently    visualized and interpreted imaging which showed no acute findings Previous records obtained and reviewed in epic no recent admissions Social determinants considered, physically inactive and social isolation Critical Interventions: None  After the interventions stated above, I reevaluated the patient and found patient to be awake and fairly asymptomatic Admission and further testing considered, no indications for admission or further workup at this time.  Recommended follow-up with PCP.  Return instructions discussed      Final diagnoses:  Fall, initial encounter  Injury of head, initial encounter  Acute strain of neck muscle, initial encounter  Lumbar contusion, initial encounter    ED  Discharge Orders     None          Towana Ozell BROCKS, MD 08/13/23 [phone]

## 2023-08-13 NOTE — ED Notes (Signed)
 Patient transported to CT

## 2023-08-15 ENCOUNTER — Ambulatory Visit: Admitting: Family Medicine

## 2023-08-15 ENCOUNTER — Encounter: Payer: Self-pay | Admitting: Family Medicine

## 2023-08-15 VITALS — BP 144/72 | HR 76 | Ht 69.0 in | Wt 169.0 lb

## 2023-08-15 DIAGNOSIS — R011 Cardiac murmur, unspecified: Secondary | ICD-10-CM | POA: Diagnosis not present

## 2023-08-15 DIAGNOSIS — W19XXXD Unspecified fall, subsequent encounter: Secondary | ICD-10-CM | POA: Diagnosis not present

## 2023-08-15 NOTE — Progress Notes (Signed)
 BP (!) 144/72 (BP Location: Left Arm, Patient Position: Supine, Cuff Size: Large)   Pulse 76   Ht 5' 9 (1.753 m)   Wt 169 lb (76.7 kg)   SpO2 96%   BMI 24.96 kg/m    Subjective:   Patient ID: Charles Beltran, male    DOB: 08-27-1950, 73 y.o.   MRN: [id number]  HPI: Charles Beltran is a 73 y.o. male presenting on 08/15/2023 for Fall ([facility] follow up- just sore, denies concussion sx's)   HPI ER follow-up Patient is coming in today for ER follow-up.  He had a fall on July 3 where he fell and hit his upper back and the back of his neck 5 days later he called into our office and they recommended he go to the emergency department.  He did go to the emergency department will evaluate the CT head and x-rays of his neck which look fine.  He says he has not had this happen since then but he has had issues with standing up and feeling dizzy previously.  He is more concerned about that and why it could possibly be happening.  He denies any lightheadedness or dizziness today.  He denies any chest pain or palpitations.  He denies any shortness of breath or wheezing.  He has been taking his blood pressure medicine for some time and his blood pressure today in the office was 117/63 sitting and standing was 142/76 and lying down was 144/72 with a heart rate consistently in the 77-78 range through all 3.  Relevant past medical, surgical, family and social history reviewed and updated as indicated. Interim medical history since our last visit reviewed. Allergies and medications reviewed and updated.  Review of Systems  Constitutional:  Negative for chills and fever.  Eyes:  Negative for visual disturbance.  Respiratory:  Negative for shortness of breath and wheezing.   Cardiovascular:  Negative for chest pain and leg swelling.  Skin:  Negative for rash.  Neurological:  Positive for dizziness and light-headedness.  All other systems reviewed and are negative.   Per HPI unless specifically indicated  above   Allergies as of 08/15/2023       Reactions   Penicillins Shortness Of Breath   Metformin  And Related Diarrhea        Medication List        Accurate as of August 15, 2023 10:03 AM. If you have any questions, ask your nurse or doctor.          STOP taking these medications    benzonatate  200 MG capsule Commonly known as: TESSALON  Stopped by: Fonda LABOR Liany Mumpower   dextromethorphan  15 MG/5ML syrup Stopped by: Fonda LABOR Nakhia Levitan   famotidine  20 MG tablet Commonly known as: Pepcid  Stopped by: Fonda LABOR Ednamae Schiano   fluticasone  50 MCG/ACT nasal spray Commonly known as: FLONASE  Stopped by: Fonda LABOR Chay Mazzoni   OneTouch Delica Lancets 33G Misc Stopped by: Fonda LABOR Toshia Larkin   OneTouch Verio Flex System w/Device Kit Stopped by: Fonda LABOR Oliver Neuwirth   OneTouch Verio test strip Generic drug: glucose blood Stopped by: Fonda LABOR Krupa Stege       TAKE these medications    amLODipine  10 MG tablet Commonly known as: NORVASC  TAKE 1 TABLET BY MOUTH EVERY DAY   atorvastatin  20 MG tablet Commonly known as: LIPITOR TAKE 1 TABLET BY MOUTH EVERY DAY   fish oil-omega-3 fatty acids 1000 MG capsule Take 1 g by mouth. One bid   hydrochlorothiazide  12.5 MG  capsule Commonly known as: MICROZIDE  TAKE 1 CAPSULE BY MOUTH EVERY DAY   lisinopril  10 MG tablet Commonly known as: ZESTRIL  TAKE 1 TABLET BY MOUTH EVERY DAY   Vitamin D  50 MCG (2000 UT) tablet Take 2,000 Units by mouth daily.         Objective:   BP (!) 144/72 (BP Location: Left Arm, Patient Position: Supine, Cuff Size: Large)   Pulse 76   Ht 5' 9 (1.753 m)   Wt 169 lb (76.7 kg)   SpO2 96%   BMI 24.96 kg/m   Wt Readings from Last 3 Encounters:  08/15/23 169 lb (76.7 kg)  08/13/23 169 lb 12.1 oz (77 kg)  08/08/23 171 lb (77.6 kg)    Physical Exam Vitals and nursing note reviewed.  Constitutional:      General: He is not in acute distress.    Appearance: He is well-developed. He is not  diaphoretic.  Eyes:     General: No scleral icterus.       Right eye: No discharge.     Conjunctiva/sclera: Conjunctivae normal.     Pupils: Pupils are equal, round, and reactive to light.  Neck:     Thyroid : No thyromegaly.  Cardiovascular:     Rate and Rhythm: Normal rate and regular rhythm.     Heart sounds: Murmur (Grade 3 holosystolic murmur best heard at right second intercostal space.) heard.  Pulmonary:     Effort: Pulmonary effort is normal. No respiratory distress.     Breath sounds: Normal breath sounds. No wheezing.  Musculoskeletal:        General: Normal range of motion.     Cervical back: Neck supple.  Lymphadenopathy:     Cervical: No cervical adenopathy.  Skin:    General: Skin is warm and dry.     Findings: No rash.  Neurological:     Mental Status: He is alert and oriented to person, place, and time.     Coordination: Coordination normal.  Psychiatric:        Behavior: Behavior normal.       Assessment & Plan:   Problem List Items Addressed This Visit   None Visit Diagnoses       Fall, subsequent encounter    -  Primary     Systolic murmur       Relevant Orders   ECHOCARDIOGRAM COMPLETE     Patient's orthostatic blood pressures were normal, patient has a murmur with no history of evaluation for that, could be concerning that that could be a cause of his near syncopal episodes that has had in the past.  We will order an echocardiogram.  Follow up plan: Return if symptoms worsen or fail to improve.  Counseling provided for all of the vaccine components Orders Placed This Encounter  Procedures   ECHOCARDIOGRAM COMPLETE    Fonda Levins, MD [facility] 08/15/2023, 10:03 AM

## 2023-08-19 ENCOUNTER — Telehealth: Payer: Self-pay | Admitting: Family

## 2023-08-19 NOTE — Telephone Encounter (Signed)
 Message sent to [facility] for Scheduling at this time.

## 2023-08-19 NOTE — Telephone Encounter (Signed)
 Checking on referral from last week. Told patient referral will call him once it is schedule.

## 2023-08-23 ENCOUNTER — Telehealth: Payer: Self-pay

## 2023-08-23 NOTE — Telephone Encounter (Signed)
 Spoke with pt and recommended he call cardiologist to see what they recommend. Pt verbalized understanding

## 2023-08-23 NOTE — Telephone Encounter (Signed)
 Copied from CRM [id number]. Topic: Clinical - Request for Lab/Test Order >> Aug 23, 2023  1:19 PM Charles Beltran wrote: Reason for CRM: Patient is asking if he will be able to drive to and from Echo appt- please call [phone]

## 2023-09-05 ENCOUNTER — Ambulatory Visit (HOSPITAL_COMMUNITY)
Admission: RE | Admit: 2023-09-05 | Discharge: 2023-09-05 | Disposition: A | Discharge status: Home / Self Care | Source: Ambulatory Visit | Attending: Family Medicine | Admitting: Family Medicine

## 2023-09-05 DIAGNOSIS — R011 Cardiac murmur, unspecified: Secondary | ICD-10-CM | POA: Diagnosis not present

## 2023-09-05 LAB — ECHOCARDIOGRAM COMPLETE
AR max vel: 3.33 cm2
AV Area VTI: 3.15 cm2
AV Area mean vel: 3.4 cm2
AV Mean grad: 8.7 mmHg
AV Peak grad: 16.7 mmHg
Ao pk vel: 2.04 m/s
Area-P 1/2: 3.42 cm2
Calc EF: 77.5 %
S' Lateral: 2.6 cm
Single Plane A2C EF: 81.9 %
Single Plane A4C EF: 71.5 %

## 2023-09-05 NOTE — Progress Notes (Signed)
  Echocardiogram 2D Echocardiogram has been performed.  Charles Beltran 09/05/2023, 10:28 AM

## 2023-09-06 ENCOUNTER — Telehealth: Payer: Self-pay

## 2023-09-06 NOTE — Telephone Encounter (Signed)
 Patient aware that Dr. Maryanne has not reviewed his echo results yet.  He will hear back once they are reviewed.

## 2023-09-06 NOTE — Telephone Encounter (Signed)
 Copied from CRM [id number]. Topic: Clinical - Lab/Test Results >> Sep 06, 2023 10:02 AM Leonette SQUIBB wrote: Reason for CRM: pt called regarding his test Echo.  He sees the results but would like for the provider to call him back

## 2023-09-10 NOTE — Telephone Encounter (Signed)
 Pt came in for results. I let him know that the results have no be reviewed by Dr Dettinger. Pt got upset and asked to talk to Dr Dettinger and I told him that Dr Dettinger is not here today. He said that he cannot believe that it has taken this long to get his results back. Pt asked for his pcp to review. I told him I would send a message.

## 2023-09-11 ENCOUNTER — Telehealth: Payer: Self-pay

## 2023-09-11 ENCOUNTER — Ambulatory Visit: Payer: Self-pay | Admitting: Family Medicine

## 2023-09-11 NOTE — Telephone Encounter (Signed)
 Duplicate message. Documented/spoke with patient in another telephone message.

## 2023-09-11 NOTE — Telephone Encounter (Signed)
 I called and left detailed message making patient aware that Dr Dettinger would like to refer him to see a cardiologist and asked if there is a specific cardiologist or location that he would like to be referred to.

## 2023-09-11 NOTE — Telephone Encounter (Signed)
 Copied from CRM #[id number]. Topic: Clinical - Medical Advice >> Sep 11, 2023  9:54 AM Avram MATSU wrote: Reason for CRM: pt would like to know more information regarding his echocardiogram and believe it could be due to his medication. He would like a callback about what route to take.  [phone] (M)

## 2023-09-11 NOTE — Telephone Encounter (Signed)
 I called and spoke with patient regarding this. Looks like there is multiple messages in the chart about this. Patient says he knows his Echocardiogram came back normal so what he wants to know is what Dr Dettinger wants next steps to be, in determining what could be causing patient to fall. He wants to know if Dr Maryanne is going to take another look at his medications to see if it could be a medication that he's taking that could be causing the falls or if Dr Maryanne thinks it could be something else. Please advise.

## 2023-09-11 NOTE — Telephone Encounter (Signed)
 Echocardiogram looks good, his blood work looks good.  None of his medications would cause him to feel like he is going to pass out unless his blood pressure was dropping too low which has not appeared to be.  There are some other possible causes of this which could be heart rhythm abnormalities, please refer him to cardiology, diagnosis near syncope

## 2023-09-11 NOTE — Telephone Encounter (Signed)
 Copied from CRM #[id number]. Topic: Clinical - Medical Advice >> Sep 11, 2023  2:48 PM Suzette B wrote: Reason for CRM: Patient states that he had received a message from Ms. Sari, he stated he is not really good with communicating via MyChart, and needed to speak with her. I called CAL for assistance, CAL informed me that Ms. Sari was currently in the room with a patient, but to leave a message and she would call the patient back!

## 2023-11-10 ENCOUNTER — Other Ambulatory Visit: Payer: Self-pay | Admitting: Family

## 2023-11-15 ENCOUNTER — Encounter: Payer: Self-pay | Admitting: Family

## 2023-11-15 ENCOUNTER — Ambulatory Visit: Admitting: Family

## 2023-11-15 VITALS — BP 132/72 | HR 82 | Temp 97.7°F | Ht 69.0 in | Wt 159.4 lb

## 2023-11-15 DIAGNOSIS — K5900 Constipation, unspecified: Secondary | ICD-10-CM | POA: Diagnosis not present

## 2023-11-15 DIAGNOSIS — E559 Vitamin D deficiency, unspecified: Secondary | ICD-10-CM

## 2023-11-15 DIAGNOSIS — R600 Localized edema: Secondary | ICD-10-CM | POA: Diagnosis not present

## 2023-11-15 DIAGNOSIS — I10 Essential (primary) hypertension: Secondary | ICD-10-CM | POA: Diagnosis not present

## 2023-11-15 DIAGNOSIS — K219 Gastro-esophageal reflux disease without esophagitis: Secondary | ICD-10-CM | POA: Diagnosis not present

## 2023-11-15 DIAGNOSIS — Z0000 Encounter for general adult medical examination without abnormal findings: Secondary | ICD-10-CM | POA: Diagnosis not present

## 2023-11-15 DIAGNOSIS — E785 Hyperlipidemia, unspecified: Secondary | ICD-10-CM

## 2023-11-15 DIAGNOSIS — E1169 Type 2 diabetes mellitus with other specified complication: Secondary | ICD-10-CM | POA: Diagnosis not present

## 2023-11-15 DIAGNOSIS — Z0001 Encounter for general adult medical examination with abnormal findings: Secondary | ICD-10-CM | POA: Diagnosis not present

## 2023-11-15 LAB — BAYER DCA HB A1C WAIVED: HB A1C (BAYER DCA - WAIVED): 11.7 % — ABNORMAL HIGH (ref 4.8–5.6)

## 2023-11-15 MED ORDER — ATORVASTATIN CALCIUM 20 MG PO TABS: 20.0000 mg | ORAL_TABLET | Freq: Every day | ORAL | 1 refills | Status: AC

## 2023-11-15 MED ORDER — AMLODIPINE BESYLATE 10 MG PO TABS: 10.0000 mg | ORAL_TABLET | Freq: Every day | ORAL | 1 refills | Status: AC

## 2023-11-15 MED ORDER — LISINOPRIL 10 MG PO TABS: 10.0000 mg | ORAL_TABLET | Freq: Every day | ORAL | 0 refills | Status: AC

## 2023-11-15 MED ORDER — HYDROCHLOROTHIAZIDE 12.5 MG PO CAPS
12.5000 mg | ORAL_CAPSULE | Freq: Every day | ORAL | 3 refills | Status: AC
Start: 2023-11-15 — End: ?

## 2023-11-15 NOTE — Progress Notes (Signed)
 Subjective:    Patient ID: Charles Beltran, male    DOB: 01-03-1951, 73 y.o.   MRN: [id number]  Chief Complaint  Patient presents with   Annual Exam   PT presents to the office today for CPE.  Hypertension This is a chronic problem. The current episode started more than 1 year ago. The problem has been resolved since onset. The problem is controlled. Pertinent negatives include no blurred vision, malaise/fatigue, peripheral edema or shortness of breath. Risk factors for coronary artery disease include obesity, male gender, dyslipidemia and sedentary lifestyle. The current treatment provides moderate improvement.  Gastroesophageal Reflux He complains of belching. This is a chronic problem. The current episode started more than 1 year ago. The problem occurs occasionally. The problem has been resolved. The symptoms are aggravated by certain foods. Risk factors include obesity. He has tried a PPI for the symptoms. The treatment provided moderate relief.  Diabetes He presents for his follow-up diabetic visit. He has type 2 diabetes mellitus. Pertinent negatives for diabetes include no blurred vision and no foot paresthesias. Symptoms are stable. Pertinent negatives for diabetic complications include no nephropathy or peripheral neuropathy. Risk factors for coronary artery disease include dyslipidemia, diabetes mellitus, hypertension and sedentary lifestyle. He is following a generally healthy diet. (Does not check BS at home )  Hyperlipidemia This is a chronic problem. The current episode started more than 1 year ago. The problem is controlled. Recent lipid tests were reviewed and are normal. Exacerbating diseases include obesity. Pertinent negatives include no shortness of breath. Current antihyperlipidemic treatment includes statins. The current treatment provides moderate improvement of lipids. Risk factors for coronary artery disease include dyslipidemia, diabetes mellitus, hypertension, male sex and  a sedentary lifestyle.      Review of Systems  Constitutional:  Negative for malaise/fatigue.  Eyes:  Negative for blurred vision.  Respiratory:  Negative for shortness of breath.   All other systems reviewed and are negative.  Family History  Problem Relation Age of Onset   Epilepsy Mother    Throat cancer Father    Alcohol abuse Father    Heart attack Brother    Stroke Brother    Social History   Socioeconomic History   Marital status: Single    Spouse name: Not on file   Number of children: 0   Years of education: 16   Highest education level: Bachelor's degree (e.g., BA, AB, BS)  Occupational History   Occupation: Retired  Tobacco Use   Smoking status: Never   Smokeless tobacco: Never  Vaping Use   Vaping status: Never Used  Substance and Sexual Activity   Alcohol use: No   Drug use: No   Sexual activity: Not Currently  Other Topics Concern   Not on file  Social History Narrative   Lives alone in an apartment with an Engineer, structural. Brother lives nearby, but most of family passed or lives out of state. No children, never married.   Social Drivers of Corporate investment banker Strain: Low Risk  (08/08/2023)   Overall Financial Resource Strain (CARDIA)    Difficulty of Paying Living Expenses: Not hard at all  Food Insecurity: No Food Insecurity (08/08/2023)   Hunger Vital Sign    Worried About Running Out of Food in the Last Year: Never true    Ran Out of Food in the Last Year: Never true  Transportation Needs: No Transportation Needs (08/08/2023)   PRAPARE - Administrator, Civil Service (Medical): No  Lack of Transportation (Non-Medical): No  Physical Activity: Inactive (08/08/2023)   Exercise Vital Sign    Days of Exercise per Week: 0 days    Minutes of Exercise per Session: 0 min  Stress: No Stress Concern Present (08/08/2023)   [employer] of Occupational Health - Occupational Stress Questionnaire    Feeling of Stress: Not at all  Social  Connections: Socially Isolated (08/08/2023)   Social Connection and Isolation Panel    Frequency of Communication with Friends and Family: More than three times a week    Frequency of Social Gatherings with Friends and Family: More than three times a week    Attends Religious Services: Never    Database administrator or Organizations: No    Attends Banker Meetings: Never    Marital Status: Never married       Objective:   Physical Exam Vitals reviewed.  Constitutional:      General: He is not in acute distress.    Appearance: He is well-developed.  HENT:     Head: Normocephalic.     Right Ear: Tympanic membrane normal.     Left Ear: Tympanic membrane normal.  Eyes:     General:        Right eye: No discharge.        Left eye: No discharge.     Pupils: Pupils are equal, round, and reactive to light.  Neck:     Thyroid : No thyromegaly.  Cardiovascular:     Rate and Rhythm: Normal rate and regular rhythm.     Heart sounds: Murmur heard.  Pulmonary:     Effort: Pulmonary effort is normal. No respiratory distress.     Breath sounds: Normal breath sounds. No wheezing.  Abdominal:     General: Bowel sounds are normal. There is no distension.     Palpations: Abdomen is soft.     Tenderness: There is no abdominal tenderness.  Genitourinary:    Testes:        Right: Mass, tenderness, swelling, testicular hydrocele or varicocele not present. Right testis is descended. Cremasteric reflex is present.         Left: Mass, tenderness, swelling, testicular hydrocele or varicocele not present. Left testis is descended. Cremasteric reflex is present.      Prostate: Not enlarged, not tender and no nodules present.  Musculoskeletal:        General: No tenderness. Normal range of motion.     Cervical back: Normal range of motion and neck supple.  Skin:    General: Skin is warm and dry.     Findings: No erythema or rash.  Neurological:     Mental Status: He is alert and  oriented to person, place, and time.     Cranial Nerves: No cranial nerve deficit.     Deep Tendon Reflexes: Reflexes are normal and symmetric.  Psychiatric:        Behavior: Behavior normal.        Thought Content: Thought content normal.        Judgment: Judgment normal.      BP 132/72   Pulse 82   Temp 97.7 F (36.5 C) (Temporal)   Ht 5' 9 (1.753 m)   Wt 159 lb 6.4 oz (72.3 kg)   SpO2 97%   BMI 23.54 kg/m      Assessment & Plan:   Charles Beltran comes in today with chief complaint of Annual Exam   Diagnosis and orders addressed:  1. Annual physical exam (Primary) - CMP14+EGFR - CBC with Differential/Platelet - Bayer DCA Hb A1c Waived - Lipid panel - TSH - Vitamin B12  2. Constipation in male - CMP14+EGFR - CBC with Differential/Platelet  3. Type 2 diabetes mellitus with other specified complication, without long-term current use of insulin (HCC) - CMP14+EGFR - CBC with Differential/Platelet - Bayer DCA Hb A1c Waived - Vitamin B12  4. Gastroesophageal reflux disease, unspecified whether esophagitis present - CMP14+EGFR - CBC with Differential/Platelet  5. Primary hypertension - lisinopril  (ZESTRIL ) 10 MG tablet; Take 1 tablet (10 mg total) by mouth daily.  Dispense: 90 tablet; Refill: 0 - hydrochlorothiazide  (MICROZIDE ) 12.5 MG capsule; Take 1 capsule (12.5 mg total) by mouth daily.  Dispense: 90 capsule; Refill: 3 - amLODipine  (NORVASC ) 10 MG tablet; Take 1 tablet (10 mg total) by mouth daily.  Dispense: 90 tablet; Refill: 1 - CMP14+EGFR - CBC with Differential/Platelet - TSH  6. Hyperlipidemia associated with type 2 diabetes mellitus (HCC) - atorvastatin  (LIPITOR) 20 MG tablet; Take 1 tablet (20 mg total) by mouth daily.  Dispense: 90 tablet; Refill: 1 - CMP14+EGFR - CBC with Differential/Platelet - Lipid panel  7. Vitamin D  deficiency - CMP14+EGFR - CBC with Differential/Platelet - VITAMIN D  25 Hydroxy (Vit-D Deficiency, Fractures)  8.  Peripheral edema - hydrochlorothiazide  (MICROZIDE ) 12.5 MG capsule; Take 1 capsule (12.5 mg total) by mouth daily.  Dispense: 90 capsule; Refill: 3 - CMP14+EGFR - CBC with Differential/Platelet    Labs pending Continue current medications  Continue current medications  Health Maintenance reviewed Diet and exercise encouraged  Follow up plan: 6 months    Bari Learn, FNP

## 2023-11-15 NOTE — Patient Instructions (Signed)
 Health Maintenance After Age 72 After age 27, you are at a higher risk for certain long-term diseases and infections as well as injuries from falls. Falls are a major cause of broken bones and head injuries in people who are older than age 73. Getting regular preventive care can help to keep you healthy and well. Preventive care includes getting regular testing and making lifestyle changes as recommended by your health care provider. Talk with your health care provider about: Which screenings and tests you should have. A screening is a test that checks for a disease when you have no symptoms. A diet and exercise plan that is right for you. What should I know about screenings and tests to prevent falls? Screening and testing are the best ways to find a health problem early. Early diagnosis and treatment give you the best chance of managing medical conditions that are common after age 90. Certain conditions and lifestyle choices may make you more likely to have a fall. Your health care provider may recommend: Regular vision checks. Poor vision and conditions such as cataracts can make you more likely to have a fall. If you wear glasses, make sure to get your prescription updated if your vision changes. Medicine review. Work with your health care provider to regularly review all of the medicines you are taking, including over-the-counter medicines. Ask your health care provider about any side effects that may make you more likely to have a fall. Tell your health care provider if any medicines that you take make you feel dizzy or sleepy. Strength and balance checks. Your health care provider may recommend certain tests to check your strength and balance while standing, walking, or changing positions. Foot health exam. Foot pain and numbness, as well as not wearing proper footwear, can make you more likely to have a fall. Screenings, including: Osteoporosis screening. Osteoporosis is a condition that causes  the bones to get weaker and break more easily. Blood pressure screening. Blood pressure changes and medicines to control blood pressure can make you feel dizzy. Depression screening. You may be more likely to have a fall if you have a fear of falling, feel depressed, or feel unable to do activities that you used to do. Alcohol  use screening. Using too much alcohol  can affect your balance and may make you more likely to have a fall. Follow these instructions at home: Lifestyle Do not drink alcohol  if: Your health care provider tells you not to drink. If you drink alcohol : Limit how much you have to: 0-1 drink a day for women. 0-2 drinks a day for men. Know how much alcohol  is in your drink. In the [place] one drink equals one 12 oz bottle of beer (355 mL), one 5 oz glass of wine (148 mL), or one 1 oz glass of hard liquor (44 mL). Do not use any products that contain nicotine or tobacco. These products include cigarettes, chewing tobacco, and vaping devices, such as e-cigarettes. If you need help quitting, ask your health care provider. Activity  Follow a regular exercise program to stay fit. This will help you maintain your balance. Ask your health care provider what types of exercise are appropriate for you. If you need a cane or walker, use it as recommended by your health care provider. Wear supportive shoes that have nonskid soles. Safety  Remove any tripping hazards, such as rugs, cords, and clutter. Install safety equipment such as grab bars in bathrooms and safety rails on stairs. Keep rooms and walkways  well-lit. General instructions Talk with your health care provider about your risks for falling. Tell your health care provider if: You fall. Be sure to tell your health care provider about all falls, even ones that seem minor. You feel dizzy, tiredness (fatigue), or off-balance. Take over-the-counter and prescription medicines only as told by your health care provider. These include  supplements. Eat a healthy diet and maintain a healthy weight. A healthy diet includes low-fat dairy products, low-fat (lean) meats, and fiber from whole grains, beans, and lots of fruits and vegetables. Stay current with your vaccines. Schedule regular health, dental, and eye exams. Summary Having a healthy lifestyle and getting preventive care can help to protect your health and wellness after age 15. Screening and testing are the best way to find a health problem early and help you avoid having a fall. Early diagnosis and treatment give you the best chance for managing medical conditions that are more common for people who are older than age 42. Falls are a major cause of broken bones and head injuries in people who are older than age 64. Take precautions to prevent a fall at home. Work with your health care provider to learn what changes you can make to improve your health and wellness and to prevent falls. This information is not intended to replace advice given to you by your health care provider. Make sure you discuss any questions you have with your health care provider. Document Revised: 06/13/2020 Document Reviewed: 06/13/2020 Elsevier Patient Education  2024 [employer].

## 2023-11-16 LAB — CBC WITH DIFFERENTIAL/PLATELET
Basophils Absolute: 0 x10E3/uL (ref 0.0–0.2)
Basos: 0 %
EOS (ABSOLUTE): 0.2 x10E3/uL (ref 0.0–0.4)
Eos: 2 %
Hematocrit: 46.1 % (ref 37.5–51.0)
Hemoglobin: 15.3 g/dL (ref 13.0–17.7)
Immature Grans (Abs): 0 x10E3/uL (ref 0.0–0.1)
Immature Granulocytes: 0 %
Lymphocytes Absolute: 1.8 x10E3/uL (ref 0.7–3.1)
Lymphs: 19 %
MCH: 30.5 pg (ref 26.6–33.0)
MCHC: 33.2 g/dL (ref 31.5–35.7)
MCV: 92 fL (ref 79–97)
Monocytes Absolute: 0.9 x10E3/uL (ref 0.1–0.9)
Monocytes: 9 %
Neutrophils Absolute: 6.5 x10E3/uL (ref 1.4–7.0)
Neutrophils: 70 %
Platelets: 209 x10E3/uL (ref 150–450)
RBC: 5.02 x10E6/uL (ref 4.14–5.80)
RDW: 12.4 % (ref 11.6–15.4)
WBC: 9.4 x10E3/uL (ref 3.4–10.8)

## 2023-11-16 LAB — CMP14+EGFR
ALT: 34 IU/L (ref 0–44)
AST: 25 IU/L (ref 0–40)
Albumin: 4.5 g/dL (ref 3.8–4.8)
Alkaline Phosphatase: 85 IU/L (ref 47–123)
BUN/Creatinine Ratio: 17 (ref 10–24)
BUN: 15 mg/dL (ref 8–27)
Bilirubin Total: 0.9 mg/dL (ref 0.0–1.2)
CO2: 21 mmol/L (ref 20–29)
Calcium: 9.8 mg/dL (ref 8.6–10.2)
Chloride: 93 mmol/L — ABNORMAL LOW (ref 96–106)
Creatinine, Ser: 0.86 mg/dL (ref 0.76–1.27)
Globulin, Total: 2.5 g/dL (ref 1.5–4.5)
Glucose: 327 mg/dL — ABNORMAL HIGH (ref 70–99)
Potassium: 4 mmol/L (ref 3.5–5.2)
Sodium: 133 mmol/L — ABNORMAL LOW (ref 134–144)
Total Protein: 7 g/dL (ref 6.0–8.5)
eGFR: 92 mL/min/1.73 (ref >59)

## 2023-11-16 LAB — LIPID PANEL
Chol/HDL Ratio: 2.4 ratio (ref 0.0–5.0)
Cholesterol, Total: 131 mg/dL (ref 100–199)
HDL: 54 mg/dL (ref >39)
LDL Chol Calc (NIH): 60 mg/dL (ref 0–99)
Triglycerides: 90 mg/dL (ref 0–149)
VLDL Cholesterol Cal: 17 mg/dL (ref 5–40)

## 2023-11-16 LAB — TSH: TSH: 2.34 u[IU]/mL (ref 0.450–4.500)

## 2023-11-16 LAB — VITAMIN B12: Vitamin B-12: 867 pg/mL (ref 232–1245)

## 2023-11-16 LAB — VITAMIN D 25 HYDROXY (VIT D DEFICIENCY, FRACTURES): Vit D, 25-Hydroxy: 35.6 ng/mL (ref 30.0–100.0)

## 2023-11-19 ENCOUNTER — Telehealth: Payer: Self-pay

## 2023-11-19 ENCOUNTER — Ambulatory Visit: Payer: Self-pay | Admitting: Family

## 2023-11-19 DIAGNOSIS — E1169 Type 2 diabetes mellitus with other specified complication: Secondary | ICD-10-CM

## 2023-11-19 MED ORDER — DAPAGLIFLOZIN PROPANEDIOL 10 MG PO TABS: 10.0000 mg | ORAL_TABLET | Freq: Every day | ORAL | 1 refills | Status: DC

## 2023-11-19 NOTE — Telephone Encounter (Signed)
 Noted

## 2023-11-19 NOTE — Telephone Encounter (Signed)
 Copied from CRM [id number]. Topic: Clinical - Lab/Test Results >> Nov 19, 2023  3:28 PM Selinda RAMAN wrote: Reason for CRM: The patient called in returning a call to [facility] stating he has no further questions regarding his labs.

## 2023-11-20 ENCOUNTER — Telehealth: Payer: Self-pay

## 2023-11-20 NOTE — Progress Notes (Signed)
 Care Guide Pharmacy Note  11/20/2023 Name: ERIVERTO BYRNES MRN: [id number] DOB: 01/23/1951  Referred By: Lavell Bari LABOR, FNP Reason for referral: Complex Care Management (Initial outreach to schedule with PharmD)   [name] is a 73 y.o. year old male who is a primary care patient of Lavell Bari LABOR, FNP.  [name] was referred to the pharmacist for assistance related to: DMII  An unsuccessful telephone outreach was attempted today to contact the patient who was referred to the pharmacy team for assistance with medication management. Additional attempts will be made to contact the patient.  [facility]  [facility] Guide  Direct Dial: [phone]  Fax [phone]

## 2023-11-20 NOTE — Progress Notes (Signed)
 Care Guide Pharmacy Note  11/20/2023 Name: Charles Beltran MRN: [id number] DOB: [birth year]  Referred By: Lavell Bari LABOR, FNP Reason for referral: Complex Care Management (Initial outreach to schedule with PharmD)   Charles Beltran is a 73 y.o. year old male who is a primary care patient of Lavell Bari LABOR, FNP.  Charles Beltran was referred to the pharmacist for assistance related to: DMII  Successful contact was made with the patient to discuss pharmacy services including being ready for the pharmacist to call at least 5 minutes before the scheduled appointment time and to have medication bottles and any blood pressure readings ready for review. The patient agreed to meet with the pharmacist via Face to Face on (date/time). 12/12/2023  .Debbe Fuse [facility], [facility] Guide  Direct Dial: [phone]  Fax [phone]

## 2023-12-02 ENCOUNTER — Encounter: Payer: Self-pay | Admitting: Family

## 2023-12-02 ENCOUNTER — Ambulatory Visit: Admitting: Family

## 2023-12-02 ENCOUNTER — Other Ambulatory Visit: Payer: Self-pay | Admitting: Family

## 2023-12-02 VITALS — BP 132/57 | HR 74 | Temp 97.7°F | Ht 69.0 in | Wt 159.0 lb

## 2023-12-02 DIAGNOSIS — Z7984 Long term (current) use of oral hypoglycemic drugs: Secondary | ICD-10-CM | POA: Diagnosis not present

## 2023-12-02 DIAGNOSIS — I10 Essential (primary) hypertension: Secondary | ICD-10-CM

## 2023-12-02 DIAGNOSIS — H538 Other visual disturbances: Secondary | ICD-10-CM

## 2023-12-02 DIAGNOSIS — E1169 Type 2 diabetes mellitus with other specified complication: Secondary | ICD-10-CM | POA: Diagnosis not present

## 2023-12-02 MED ORDER — LANCET DEVICE MISC: 1.0000 | 0 refills | Status: AC

## 2023-12-02 MED ORDER — LANCETS MISC: 1.0000 | 0 refills | Status: AC

## 2023-12-02 MED ORDER — BLOOD GLUCOSE TEST VI STRP: 1.0000 | ORAL_STRIP | 0 refills | Status: DC

## 2023-12-02 MED ORDER — BLOOD GLUCOSE MONITORING SUPPL DEVI: 1.0000 | 0 refills | Status: DC

## 2023-12-02 NOTE — Patient Instructions (Signed)

## 2023-12-02 NOTE — Progress Notes (Signed)
 Subjective:    Patient ID: Charles Beltran, male    DOB: 04/17/1950, 73 y.o.   MRN: [id number]  Chief Complaint  Patient presents with   Blurred Vision    Blurred vision for the past few days. Wonders if it is Farxiga. Feels fine otherwise.    PT presents to the office today with blurred vision that he noticed 5 days that comes and goes.   He report he started Farxiga 10 mg for the last 11 days. He is worried that this is causing this.   He wears corrected lens, but has not been wearing them. States when he wears these he does not have any problem with his vision.  Diabetes He presents for his follow-up diabetic visit. He has type 2 diabetes mellitus. Associated symptoms include blurred vision. Pertinent negatives for diabetes include no foot paresthesias. Symptoms are stable. Risk factors for coronary artery disease include dyslipidemia, diabetes mellitus, hypertension, male sex and sedentary lifestyle. He is following a generally healthy diet. (Does not check glucose at home)  Hypertension This is a chronic problem. The current episode started more than 1 year ago. The problem has been resolved since onset. The problem is controlled. Associated symptoms include blurred vision. Pertinent negatives include no malaise/fatigue, peripheral edema or shortness of breath. Past treatments include diuretics and ACE inhibitors. The current treatment provides moderate improvement.      Review of Systems  Constitutional:  Negative for malaise/fatigue.  Eyes:  Positive for blurred vision.  Respiratory:  Negative for shortness of breath.   All other systems reviewed and are negative.   Social History   Socioeconomic History   Marital status: Single    Spouse name: Not on file   Number of children: 0   Years of education: 16   Highest education level: Bachelor's degree (e.g., BA, AB, BS)  Occupational History   Occupation: Retired  Tobacco Use   Smoking status: Never   Smokeless tobacco:  Never  Vaping Use   Vaping status: Never Used  Substance and Sexual Activity   Alcohol use: No   Drug use: No   Sexual activity: Not Currently  Other Topics Concern   Not on file  Social History Narrative   Lives alone in an apartment with an engineer, structural. Brother lives nearby, but most of family passed or lives out of state. No children, never married.   Social Drivers of Corporate Investment Banker Strain: Low Risk  (08/08/2023)   Overall Financial Resource Strain (CARDIA)    Difficulty of Paying Living Expenses: Not hard at all  Food Insecurity: No Food Insecurity (08/08/2023)   Hunger Vital Sign    Worried About Running Out of Food in the Last Year: Never true    Ran Out of Food in the Last Year: Never true  Transportation Needs: No Transportation Needs (08/08/2023)   PRAPARE - Administrator, Civil Service (Medical): No    Lack of Transportation (Non-Medical): No  Physical Activity: Inactive (08/08/2023)   Exercise Vital Sign    Days of Exercise per Week: 0 days    Minutes of Exercise per Session: 0 min  Stress: No Stress Concern Present (08/08/2023)   [employer] of Occupational Health - Occupational Stress Questionnaire    Feeling of Stress: Not at all  Social Connections: Socially Isolated (08/08/2023)   Social Connection and Isolation Panel    Frequency of Communication with Friends and Family: More than three times a week  Frequency of Social Gatherings with Friends and Family: More than three times a week    Attends Religious Services: Never    Database Administrator or Organizations: No    Attends Engineer, Structural: Never    Marital Status: Never married   Family History  Problem Relation Age of Onset   Epilepsy Mother    Throat cancer Father    Alcohol abuse Father    Heart attack Brother    Stroke Brother         Objective:   Physical Exam Vitals reviewed.  Constitutional:      General: He is not in acute distress.    Appearance:  He is well-developed.  HENT:     Head: Normocephalic.     Right Ear: Tympanic membrane normal.     Left Ear: Tympanic membrane normal.  Eyes:     General:        Right eye: No discharge.        Left eye: No discharge.     Pupils: Pupils are equal, round, and reactive to light.  Neck:     Thyroid : No thyromegaly.  Cardiovascular:     Rate and Rhythm: Normal rate and regular rhythm.     Heart sounds: Normal heart sounds. No murmur heard. Pulmonary:     Effort: Pulmonary effort is normal. No respiratory distress.     Breath sounds: Normal breath sounds. No wheezing.  Abdominal:     General: Bowel sounds are normal. There is no distension.     Palpations: Abdomen is soft.     Tenderness: There is no abdominal tenderness.  Musculoskeletal:        General: No tenderness. Normal range of motion.     Cervical back: Normal range of motion and neck supple.  Skin:    General: Skin is warm and dry.     Findings: No erythema or rash.  Neurological:     Mental Status: He is alert and oriented to person, place, and time.     Cranial Nerves: No cranial nerve deficit.     Deep Tendon Reflexes: Reflexes are normal and symmetric.  Psychiatric:        Behavior: Behavior normal.        Thought Content: Thought content normal.        Judgment: Judgment normal.       BP (!) 132/57   Pulse 74   Temp 97.7 F (36.5 C)   Ht 5' 9 (1.753 m)   Wt 159 lb (72.1 kg)   SpO2 99%   BMI 23.48 kg/m      Assessment & Plan:  Charles Beltran comes in today with chief complaint of Blurred Vision (Blurred vision for the past few days. Wonders if it is Farxiga. Feels fine otherwise. )   Diagnosis and orders addressed:  1. Blurred vision (Primary) Encouraged to start wear glasses Need to have good control of glucose and HTN  2. Type 2 diabetes mellitus with other specified complication, without long-term current use of insulin (HCC) Continue Farxiga  Low carb diet Glucose meter Prescription sent  to pharmacy  Keep follow up with Clinical Pharmacists  - Blood Glucose Monitoring Suppl DEVI; 1 each by Does not apply route as directed. Dispense based on patient and insurance preference. Use up to four times daily as directed. (FOR ICD-10 E10.9, E11.9).  Dispense: 1 each; Refill: 0 - Glucose Blood (BLOOD GLUCOSE TEST STRIPS) STRP; 1 each by Does not  apply route as directed. Dispense based on patient and insurance preference. Use up to four times daily as directed. (FOR ICD-10 E10.9, E11.9).  Dispense: 100 strip; Refill: 0 - Lancet Device MISC; 1 each by Does not apply route as directed. Dispense based on patient and insurance preference. Use up to four times daily as directed. (FOR ICD-10 E10.9, E11.9).  Dispense: 1 each; Refill: 0 - Lancets MISC; 1 each by Does not apply route as directed. Dispense based on patient and insurance preference. Use up to four times daily as directed. (FOR ICD-10 E10.9, E11.9).  Dispense: 100 each; Refill: 0  3. Primary hypertension - CMP14+EGFR        Bari Learn, FNP

## 2023-12-03 ENCOUNTER — Ambulatory Visit: Payer: Self-pay | Admitting: Family

## 2023-12-03 LAB — CMP14+EGFR
ALT: 25 IU/L (ref 0–44)
AST: 23 IU/L (ref 0–40)
Albumin: 4.5 g/dL (ref 3.8–4.8)
Alkaline Phosphatase: 74 IU/L (ref 47–123)
BUN/Creatinine Ratio: 20 (ref 10–24)
BUN: 21 mg/dL (ref 8–27)
Bilirubin Total: 0.6 mg/dL (ref 0.0–1.2)
CO2: 22 mmol/L (ref 20–29)
Calcium: 9.8 mg/dL (ref 8.6–10.2)
Chloride: 95 mmol/L — ABNORMAL LOW (ref 96–106)
Creatinine, Ser: 1.06 mg/dL (ref 0.76–1.27)
Globulin, Total: 2.3 g/dL (ref 1.5–4.5)
Glucose: 266 mg/dL — ABNORMAL HIGH (ref 70–99)
Potassium: 4.4 mmol/L (ref 3.5–5.2)
Sodium: 133 mmol/L — ABNORMAL LOW (ref 134–144)
Total Protein: 6.8 g/dL (ref 6.0–8.5)
eGFR: 75 mL/min/1.73 (ref >59)

## 2023-12-11 NOTE — Progress Notes (Unsigned)
 12/12/2023 Name: Charles Beltran MRN: [id number] DOB: Oct 20, 1950  Chief Complaint  Patient presents with   Diabetes    Charles Beltran is a 73 y.o. year old male who was referred for medication management by their primary care provider, Lavell Bari LABOR, FNP. They presented for a face to face visit today.   They were referred to the pharmacist by their PCP for assistance in managing diabetes    Subjective:  Care Team: Primary Care Provider: Lavell Bari LABOR, FNP ; Next Scheduled Visit: 05/19/2024  Medication Access/Adherence  [facility]acy:  [phone] [place] - [address] [place] [address] Phone: [phone] Fax: [phone]   Patient reports affordability concerns with their medications: Yes  Patient reports access/transportation concerns to their pharmacy: No  Patient reports adherence concerns with their medications:  No     Diabetes: - Historically, A1C in 2016 was 5.6% which increased to 7.3% in 04/2020.  - Patient maintained A1C between 5.9-6.7% between 08/2020 and 05/2023.    Metformin  from 04/2020 to 10/2021  Jardiance  from 10/2021 to 10/2022, when patient had to discontinue d/t severe GI pain. - A1C increased to 11.7% in 11/2023  At prior visit with PCP on 12/02/2023, patient complained of new blurry vision that would come and go within 11 days of starting Farxiga. Patient has corrective lenses, when worn he does not experience these symptoms.  Current medications:  - Farxiga 10 mg daily Medications tried in the past: above  Current glucose readings: 279 mg/dL  Patient denies hypoglycemic s/sx including dizziness, shakiness, sweating. Patient reports hyperglycemic symptoms including polyuria, polydipsia, blurred vision.  Current meal patterns:  - Breakfast: cereal (wheaties, was cherrios before) organic fat free milk, strawberries cantaloupe, or other berries - Lunch: grilled chicken, vegetables, at [employer] - Supper: Subway grilled chicken or turkey breast 6 inch on 9 grain wheat - Snacks: popcorn if has something - Drinks: water, green tea (not sure if sugar free)  Current physical activity: - n/a  Current medication access support: None  Macrovascular and Microvascular Risk Reduction:  Statin? yes (Atorvastatin  20 mg); ACEi/ARB? yes (Lisinopril  10 mg) Last urinary albumin/creatinine ratio:  Lab Results  Component Value Date   MICRALBCREAT 33 (H) 05/14/2023   MICRALBCREAT 24 04/23/2022   Last eye exam:  Lab Results  Component Value Date   HMDIABEYEEXA No Retinopathy 05/30/2023   Last foot exam: 05/14/2023 Tobacco Use:  Tobacco Use: Low Risk  (12/02/2023)   Patient History    Smoking Tobacco Use: Never    Smokeless Tobacco Use: Never    Passive Exposure: Not on file     Objective:  Lab Results  Component Value Date   HGBA1C 11.7 (H) 11/15/2023    Lab Results  Component Value Date   CREATININE 1.06 12/02/2023   BUN 21 12/02/2023   NA 133 (L) 12/02/2023   K 4.4 12/02/2023   CL 95 (L) 12/02/2023   CO2 22 12/02/2023    Lab Results  Component Value Date   CHOL 131 11/15/2023   HDL 54 11/15/2023   LDLCALC 60 11/15/2023   TRIG 90 11/15/2023   CHOLHDL 2.4 11/15/2023    Medications Reviewed Today     Reviewed by Gaile Powell CHRISTELLA, RPH (Pharmacist) on 12/12/23 at 1012  Med List Status: <None>   Medication Order Taking? Sig Documenting Provider Last Dose Status Informant  amLODipine  (NORVASC ) 10 MG tablet [id number]  Take 1 tablet (10 mg total) by  mouth daily. Lavell Lye A, FNP  Active   atorvastatin  (LIPITOR) 20 MG tablet [id number]  Take 1 tablet (20 mg total) by mouth daily. Lavell Lye LABOR, FNP  Active   Blood Glucose Monitoring Suppl DEVI [id number]  1 each by Does not apply route as directed. Dispense based on patient and insurance preference. Use up to four times daily as directed. (FOR ICD-10 E10.9, E11.9). Lavell Lye LABOR, FNP  Active    Cholecalciferol (VITAMIN D ) 2000 UNITS tablet [id number]  Take 2,000 Units by mouth daily. [provider]  Active Self  dapagliflozin propanediol (FARXIGA) 10 MG TABS tablet [id number]  Take 1 tablet (10 mg total) by mouth daily before breakfast. Lavell Lye A, FNP  Active   fish oil-omega-3 fatty acids 1000 MG capsule [id number]  Take 1 g by mouth. One bid [provider]  Active Self  glucose blood (ACCU-CHEK GUIDE TEST) test strip [id number]  Test BS daily Dx E11.9 Lavell Lye A, FNP  Active   hydrochlorothiazide  (MICROZIDE ) 12.5 MG capsule [id number]  Take 1 capsule (12.5 mg total) by mouth daily. Lavell Lye LABOR, FNP  Active   Lancet Device MISC [id number]  1 each by Does not apply route as directed. Dispense based on patient and insurance preference. Use up to four times daily as directed. (FOR ICD-10 E10.9, E11.9). Lavell Lye LABOR, FNP  Active   Lancets MISC [id number]  1 each by Does not apply route as directed. Dispense based on patient and insurance preference. Use up to four times daily as directed. (FOR ICD-10 E10.9, E11.9). Lavell Lye A, FNP  Active   lisinopril  (ZESTRIL ) 10 MG tablet [id number]  Take 1 tablet (10 mg total) by mouth daily. Lavell Lye A, FNP  Active             Assessment/Plan:   Diabetes: - Currently uncontrolled; goal A1c <7%. POC blood glucose today 279 mg/dL 2 hours after eating. Patient reports not checking blood sugar at home. - Cardiorenal risk reduction is optimized.. Blood pressure is not at goal <130/80. LDL is at goal.  - Reviewed long term cardiovascular and renal outcomes of uncontrolled blood sugar., Reviewed goal A1c, goal fasting, and goal 2 hour post prandial glucose. Recommended to check glucose with CGM throughout day, and Reviewed dietary modifications including monitoring added sugars. - Recommend to start Freestyle Libre 3 plus, installed sensor at visit, provided next sensor and reader. Plan to send in Rx to preferred  pharmacy. ., Recommend to continue Farxiga 10 mg daily. - Discussed potential side effects of dehydration, genitourinary infections., Encouraged adequate hydration and genital hygiene.  - Discussed potential next steps which include initiation of GLP-1 once weekly or basal insulin. Patient was not ready at this time to start new therapy after being on Farxiga for only 2 weeks - Advised patient to call if his blood sugar reaches 300 or higher. Freestyle Libre 3 Plus Sample - Educated patient on how to apply [place] 3 Plus sensor - Applied sample sensor (Lot [id number], Exp 04/04/2024) - Provided Freestyle Libre 3 reader device   Follow Up Plan: Follow up in 2 weeks (12/26/23) to assess CGM readings and discuss need for additional therapy.  Powell Gallus, PharmD, MPH Pharmacy Resident   Mliss Tarry Griffin, PharmD, BCACP, CPP Clinical Pharmacist, [facility]

## 2023-12-12 ENCOUNTER — Telehealth: Payer: Self-pay

## 2023-12-12 ENCOUNTER — Ambulatory Visit: Payer: Self-pay | Admitting: Pharmacist

## 2023-12-12 ENCOUNTER — Other Ambulatory Visit (HOSPITAL_COMMUNITY): Payer: Self-pay

## 2023-12-12 DIAGNOSIS — Z7984 Long term (current) use of oral hypoglycemic drugs: Secondary | ICD-10-CM | POA: Diagnosis not present

## 2023-12-12 DIAGNOSIS — E1165 Type 2 diabetes mellitus with hyperglycemia: Secondary | ICD-10-CM

## 2023-12-12 MED ORDER — FREESTYLE LIBRE 3 PLUS SENSOR MISC: 3 refills | Status: AC

## 2023-12-12 NOTE — Progress Notes (Deleted)
 Freestyle Libre 3 Plus Sample - Educated patient on how to apply [place] 3 Plus sensor - Applied sample sensor (Lot #[id number], Exp 04/04/2024) - Provided Freestyle Libre 3 reader device  Powell Gallus, PharmD, MPH Pharmacy Resident

## 2023-12-12 NOTE — Telephone Encounter (Signed)
 Copied from CRM #[id number]. Topic: Clinical - Medical Advice >> Dec 12, 2023  2:17 PM Montie POUR wrote: Reason for CRM:   He wants to get a reading from his glucose monitor. Please call him and tell him how to use it step by step. Please call [phone] to discuss. Thanks

## 2023-12-25 NOTE — Progress Notes (Unsigned)
 12/26/2023 Name: Charles Beltran MRN: [id number] DOB: [birth year]  Chief Complaint  Patient presents with   Diabetes    Charles Beltran is a 73 y.o. year old male who was referred for medication management by their primary care provider, Lavell Bari LABOR, FNP. They presented for a face to face visit today.   They were referred to the pharmacist by their PCP for assistance in managing diabetes    Subjective:  Patient presents for new CGM start follow up.  He has been using his libre 3 PLUS CGM with reader for about 30 days.  His latest sensor connection lost malfunctioned 3 days ago.  He reports improved glycemic control with addition of Farxiga and CGM.  Care Team: Primary Care Provider: Lavell Bari LABOR, FNP    Medication Access/Adherence  [facility]  [phone] [place] - [address] [place] [address] Phone: [phone] Fax: [phone]  [facility] - [place] - [address] [place] [address] Phone: [phone] Fax: [phone]   Patient reports affordability concerns with their medications: Yes  Patient reports access/transportation concerns to their pharmacy: No  Patient reports adherence concerns with their medications:  Yes  intolerance and cost   Diabetes: - Historically, A1C in 2016 was 5.6% which increased to 7.3% in 04/2020.  - Patient maintained A1C between 5.9-6.7% between 08/2020 and 05/2023.               Metformin  from 04/2020 to 10/2021             Jardiance  from 10/2021 to 10/2022, when patient had to discontinue d/t severe GI pain. - A1C increased to 11.7% in 11/2023, however GMI improved to 7.2%   At prior visit with PCP on 12/02/2023, patient complained of new blurry vision that would come and go within 11 days of starting Farxiga. Patient has corrective lenses, when worn he does not experience these symptoms.   Current medications:  - Farxiga 10 mg  daily Medications tried in the past: above   Current glucose readings: reports lowest was 104, with some in the 300s but rare Libre 3 PLUS Avg 7 days -169, TIR 67% Avg 14 -169, TIR 70% Avg 30-163, TIR 70% Discussed post prandial highs; diet based, but now more mindful     Patient denies hypoglycemic s/sx including dizziness, shakiness, sweating. Patient reports hyperglycemic symptoms including polyuria, polydipsia, blurred vision.   Current meal patterns:  - Breakfast: cereal (wheaties, was cherrios before) organic fat free milk, strawberries cantaloupe, or other berries - Lunch: grilled chicken, vegetables, at [employer] - Supper: [employer]way grilled chicken or turkey breast 6 inch on 9 grain wheat - Snacks: popcorn if has something - Drinks: water, green tea (not sure if sugar free)   Current physical activity: - n/a   Current medication access support: None   Objective:  Lab Results  Component Value Date   HGBA1C 11.7 (H) 11/15/2023    Lab Results  Component Value Date   CREATININE 1.06 12/02/2023   BUN 21 12/02/2023   NA 133 (L) 12/02/2023   K 4.4 12/02/2023   CL 95 (L) 12/02/2023   CO2 22 12/02/2023    Lab Results  Component Value Date   CHOL 131 11/15/2023   HDL 54 11/15/2023   LDLCALC 60 11/15/2023   TRIG 90 11/15/2023   CHOLHDL 2.4 11/15/2023    Medications Reviewed Today  Reviewed by Billee Mliss BIRCH, [facility] (Pharmacist) on 12/26/23 at 1513  Med List Status: <None>   Medication Order Taking? Sig Documenting Provider Last Dose Status Informant  amLODipine  (NORVASC ) 10 MG tablet [id number]  Take 1 tablet (10 mg total) by mouth daily. Lavell Lye A, FNP  Active   atorvastatin  (LIPITOR) 20 MG tablet [id number]  Take 1 tablet (20 mg total) by mouth daily. Lavell Lye LABOR, FNP  Active     Discontinued 12/26/23 1513 (Duplicate)   Cholecalciferol (VITAMIN D ) 2000 UNITS tablet [id number]  Take 2,000 Units by mouth daily. [provider]  Active  Self  Continuous Glucose Sensor (FREESTYLE LIBRE 3 PLUS SENSOR) MISC [id number]  Change sensor every 15 days. DX:E11.65 Lavell Lye A, FNP  Active   dapagliflozin propanediol (FARXIGA) 10 MG TABS tablet [id number]  Take 1 tablet (10 mg total) by mouth daily before breakfast. Lavell Lye A, FNP  Active   fish oil-omega-3 fatty acids 1000 MG capsule [id number]  Take 1 g by mouth. One bid [provider]  Active Self  glucose blood (ACCU-CHEK GUIDE TEST) test strip [id number]  Test BS daily Dx E11.9 Lavell Lye A, FNP  Active   hydrochlorothiazide  (MICROZIDE ) 12.5 MG capsule [id number]  Take 1 capsule (12.5 mg total) by mouth daily. Lavell Lye LABOR, FNP  Active   Lancet Device MISC [id number]  1 each by Does not apply route as directed. Dispense based on patient and insurance preference. Use up to four times daily as directed. (FOR ICD-10 E10.9, E11.9). Lavell Lye LABOR, FNP  Consider Medication Status and Discontinue (Completed Course)   Lancets MISC [id number]  1 each by Does not apply route as directed. Dispense based on patient and insurance preference. Use up to four times daily as directed. (FOR ICD-10 E10.9, E11.9). Lavell Lye A, FNP  Active   lisinopril  (ZESTRIL ) 10 MG tablet [id number]  Take 1 tablet (10 mg total) by mouth daily. Lavell Lye LABOR, FNP  Active               Assessment/Plan:   Diabetes: - Currently uncontrolled, but much improved based on GMI 7.2% avg blood sugar in the 160s; goal A1c <7%. POC blood glucose today 165 mg/dL 2 hours after eating. Patient now using Libre 3 PLUS CGM w/ reader. -New CGM sensor placed today due to sensor malfunction  - Cardiorenal risk reduction is optimized.. Blood pressure is at goal <130/80 today (documented). LDL is at goal.  - Reviewed long term cardiovascular and renal outcomes of uncontrolled blood sugar., Reviewed goal A1c, goal fasting, and goal 2 hour post prandial glucose. Recommended to check glucose with CGM  throughout day, and Reviewed dietary modifications including monitoring added sugars. - Recommend to continue Farxiga 10 mg daily. (Awaiting patient assistance but has supply at home) - Discussed potential side effects of dehydration, genitourinary infections., Encouraged adequate hydration and genital hygiene.  - Advised patient to call if his blood sugar reaches 300 or higher. Freestyle Libre 3 Plus Sample - Educated patient on how to apply [place] 3 Plus sensor - Applied sample sensor (Lot #[id number], Exp 04/04/2024) - Provided Freestyle Libre 3 reader device at last visit -his phone is compatible, but he did not have current email address to set up app     Follow Up Plan: Follow up in 2 weeks (01/09/24) to apply new CGM and discuss need for additional therapy.   Earlean Fidalgo Dattero Taleisha Kaczynski, PharmD, BCACP, CPP Clinical Pharmacist, [facility]

## 2023-12-26 ENCOUNTER — Ambulatory Visit: Admitting: Pharmacist

## 2023-12-26 VITALS — BP 130/74 | HR 80

## 2023-12-26 DIAGNOSIS — Z7984 Long term (current) use of oral hypoglycemic drugs: Secondary | ICD-10-CM

## 2023-12-26 DIAGNOSIS — E119 Type 2 diabetes mellitus without complications: Secondary | ICD-10-CM | POA: Diagnosis not present

## 2023-12-26 MED ORDER — DAPAGLIFLOZIN PROPANEDIOL 10 MG PO TABS: 10.0000 mg | ORAL_TABLET | Freq: Every day | ORAL | 1 refills | Status: DC

## 2023-12-26 NOTE — Telephone Encounter (Signed)
 See pharmd encounters

## 2023-12-26 NOTE — Patient Instructions (Signed)
 Continue Farxiga 10mg  daily Continue using Libre 3 continuous blood glucose monitoring system You should expect your shipment of Farxiga patient assistance within 30 days. Keep up the great work!  We will see you again on 01/09/24 for another sensor placement and continued education.  Make sure to bring your libre 3 reader device with you.  Happy Thanksgiving!

## 2023-12-30 ENCOUNTER — Telehealth: Payer: Self-pay

## 2023-12-30 NOTE — Telephone Encounter (Signed)
 PAP: Application for Farxiga has been submitted to [employer] ([employer]), via fax  NEW

## 2024-01-07 NOTE — Telephone Encounter (Signed)
   New approval for Farxiga  PAP

## 2024-01-08 ENCOUNTER — Telehealth: Payer: Self-pay | Admitting: Pharmacist

## 2024-01-08 DIAGNOSIS — E119 Type 2 diabetes mellitus without complications: Secondary | ICD-10-CM

## 2024-01-08 MED ORDER — DAPAGLIFLOZIN PROPANEDIOL 10 MG PO TABS: 10.0000 mg | ORAL_TABLET | Freq: Every day | ORAL | 4 refills | Status: AC

## 2024-01-08 NOTE — Telephone Encounter (Signed)
   Patient enrolled in the [employer] patient assistance program for [place].  Updated RX escribed to [facility] mail order (pharmacy for AZ[employer] patient assistance).  Patient is stable on current regimen.    Kieth Brightly, PharmD, BCACP, CPP Clinical Pharmacist, [facility]

## 2024-01-09 ENCOUNTER — Ambulatory Visit: Admitting: Pharmacist

## 2024-01-09 ENCOUNTER — Telehealth: Payer: Self-pay | Admitting: Pharmacist

## 2024-01-09 VITALS — BP 125/74 | HR 65

## 2024-01-09 DIAGNOSIS — Z7984 Long term (current) use of oral hypoglycemic drugs: Secondary | ICD-10-CM

## 2024-01-09 DIAGNOSIS — E119 Type 2 diabetes mellitus without complications: Secondary | ICD-10-CM

## 2024-01-09 NOTE — Progress Notes (Signed)
 01/09/2024 Name: Charles Beltran MRN: [id number] DOB: Jun 26, 1950  Chief Complaint  Patient presents with   Diabetes    Charles Beltran is a 73 y.o. year old male who was referred for medication management by their primary care provider, Charles Bari LABOR, FNP. They presented for a face to face visit today.   They were referred to the pharmacist by their PCP for assistance in managing diabetes    Subjective:  Patient presents for new CGM start follow up.  He has been using his libre 3 PLUS CGM with reader for about 30 days.  His latest sensor stopped working yesterday.  He reports improved glycemic control with addition of Farxiga  and CGM.  He is here for CGM interpretation and application.  Care Team: Primary Care Provider: Lavell Bari LABOR, FNP   Medication Access/Adherence  [facility]  [phone] [place] - [address] [place] [address] Phone: [phone] Fax: [phone]  [facility] - [place] - [address] [place] [address] Phone: [phone] Fax: [phone]   Patient reports affordability concerns with their medications: Yes  Patient reports access/transportation concerns to their pharmacy: No  Patient reports adherence concerns with their medications:  Yes  intolerance and cost   Diabetes: - Historically, A1C in 2016 was 5.6% which increased to 7.3% in 04/2020.  - Patient maintained A1C between 5.9-6.7% between 08/2020 and 05/2023.               Metformin  from 04/2020 to 10/2021             Jardiance  from 10/2021 to 10/2022, when patient had to discontinue d/t severe GI pain. - A1C increased to 11.7% in 11/2023, however GMI improved to 7.1%   At prior visit with PCP on 12/02/2023, patient complained of new blurry vision that would come and go within 11 days of starting Farxiga . Patient has corrective lenses, when worn he does not experience these symptoms.   Current medications:  -  Farxiga  10 mg daily Medications tried in the past: above   Current glucose readings: reports lowest was in the 90s Libre 3 PLUS CGM, uses Reader Avg 14 -169, TIR 72% Discussed post prandial highs (after breakfast and dinner); diet based, but now more mindful     Patient denies hypoglycemic s/sx including dizziness, shakiness, sweating. Patient reports hyperglycemic symptoms including polyuria, polydipsia, blurred vision.   Current meal patterns:  - Breakfast: cereal (wheaties, was cherrios before) organic fat free milk, strawberries cantaloupe, or other berries - Lunch: grilled chicken, vegetables, at [employer] - Supper: [employer] grilled chicken or turkey breast 6 inch on 9 grain wheat - Snacks: popcorn if has something - Drinks: water, green tea (not sure if sugar free)   Current physical activity: - n/a   Current medication access support: None   Objective:  Lab Results  Component Value Date   HGBA1C 11.7 (H) 11/15/2023    Lab Results  Component Value Date   CREATININE 1.06 12/02/2023   BUN 21 12/02/2023   NA 133 (L) 12/02/2023   K 4.4 12/02/2023   CL 95 (L) 12/02/2023   CO2 22 12/02/2023    Lab Results  Component Value Date   CHOL 131 11/15/2023   HDL 54 11/15/2023   LDLCALC 60 11/15/2023   TRIG 90 11/15/2023   CHOLHDL 2.4 11/15/2023    Medications Reviewed Today   Medications were not reviewed  in this encounter       Assessment/Plan:   Diabetes: - Currently uncontrolled, but much improved based on GMI 7.2% avg blood sugar in the 160s; goal A1c <7%. POC blood glucose today 165 mg/dL 2 hours after eating. Patient now using Libre 3 PLUS CGM w/ reader. -New CGM sensor placed today due to sensor malfunction  - Cardiorenal risk reduction is optimized.. Blood pressure is at goal <130/80 today (documented). LDL is at goal.  - Reviewed long term cardiovascular and renal outcomes of uncontrolled blood sugar., Reviewed goal A1c, goal fasting, and goal 2  hour post prandial glucose. Recommended to check glucose with CGM throughout day, and Reviewed dietary modifications including monitoring added sugars. - Recommend to continue Farxiga  10 mg daily. (Awaiting patient assistance but has supply at home) - Discussed potential side effects of dehydration, genitourinary infections., Encouraged adequate hydration and genital hygiene.  - Advised patient to call if his blood sugar reaches 300 or higher. Freestyle Libre 3 Plus Sample - Educated patient on how to apply Libre 3 Plus sensor - Applied sample sensor (Lot #[id number], Exp 04/04/2024) - his phone is compatible, but he did not have current email address to set up app -BP within normal limits     Follow Up Plan: Follow up in 2-3 weeks   Charles Beltran, PharmD, BCACP, CPP Clinical Pharmacist, [facility]

## 2024-01-13 ENCOUNTER — Telehealth: Payer: Self-pay | Admitting: Family

## 2024-01-13 NOTE — Telephone Encounter (Signed)
 Patient came in and states he needs to see Mliss on 12-18 to change his Herlene. States Mliss was supposed to schedule the appt and hadn't done yet. Also patient states he hadn't got his Farxiga . Wants to know if he needs to call the company. Please call.

## 2024-02-04 ENCOUNTER — Telehealth: Payer: Self-pay | Admitting: Family

## 2024-02-04 NOTE — Telephone Encounter (Unsigned)
 Copied from CRM #[id number]. Topic: Appointments - Appointment Cancel/Reschedule >> Feb 04, 2024  9:38 AM DeAngela L wrote: Patient/patient representative is calling to cancel or reschedule an appointment. Refer to attachments for appointment information.   Patient would like to cancel appointment on 02/13/2024 with pharmacist no rescheduling needed

## 2024-02-13 ENCOUNTER — Ambulatory Visit

## 2024-02-18 ENCOUNTER — Telehealth: Payer: Self-pay | Admitting: Family

## 2024-02-18 ENCOUNTER — Ambulatory Visit: Admitting: Family

## 2024-02-18 DIAGNOSIS — I10 Essential (primary) hypertension: Secondary | ICD-10-CM

## 2024-02-18 DIAGNOSIS — E1169 Type 2 diabetes mellitus with other specified complication: Secondary | ICD-10-CM

## 2024-02-18 NOTE — Telephone Encounter (Signed)
 Patient aware and verbalized understanding.

## 2024-02-18 NOTE — Telephone Encounter (Signed)
 Copied from CRM [id number]. Topic: Clinical - Medical Advice >> Feb 18, 2024  2:07 PM Alfonso ORN wrote: Reason for CRM: patient would like to get tested to  have heart arties check if have any partial blockages reason heart disease is in patient family tree and patient is getting older  Patient do have any symptoms   Please call back to let know

## 2024-02-18 NOTE — Telephone Encounter (Signed)
 Referral to Cardiologist sent

## 2024-05-13 ENCOUNTER — Ambulatory Visit: Admitting: Cardiology

## 2024-05-15 ENCOUNTER — Ambulatory Visit: Admitting: Family

## 2024-05-19 ENCOUNTER — Ambulatory Visit: Payer: Self-pay | Admitting: Family

## 2024-08-11 ENCOUNTER — Ambulatory Visit

## 2024-09-02 ENCOUNTER — Ambulatory Visit: Payer: Self-pay

## 2024-09-08 ENCOUNTER — Ambulatory Visit
